# Patient Record
Sex: Female | Born: 1955 | Race: Black or African American | Hispanic: No | Marital: Single | Smoking: Current every day smoker
Health system: Southern US, Academic
[De-identification: ages and names within clinical notes are randomized; demographics above are authoritative.]

## PROBLEM LIST (undated history)

## (undated) DIAGNOSIS — M129 Arthropathy, unspecified: Secondary | ICD-10-CM

## (undated) DIAGNOSIS — M797 Fibromyalgia: Secondary | ICD-10-CM

## (undated) DIAGNOSIS — J449 Chronic obstructive pulmonary disease, unspecified: Secondary | ICD-10-CM

## (undated) DIAGNOSIS — R519 Headache, unspecified: Secondary | ICD-10-CM

## (undated) DIAGNOSIS — E079 Disorder of thyroid, unspecified: Secondary | ICD-10-CM

## (undated) DIAGNOSIS — F32A Depression, unspecified: Secondary | ICD-10-CM

## (undated) DIAGNOSIS — G4730 Sleep apnea, unspecified: Secondary | ICD-10-CM

## (undated) DIAGNOSIS — E66811 Obesity, class 1: Secondary | ICD-10-CM

## (undated) DIAGNOSIS — J45909 Unspecified asthma, uncomplicated: Secondary | ICD-10-CM

## (undated) DIAGNOSIS — R0602 Shortness of breath: Secondary | ICD-10-CM

## (undated) DIAGNOSIS — Z803 Family history of malignant neoplasm of breast: Secondary | ICD-10-CM

## (undated) DIAGNOSIS — M1990 Unspecified osteoarthritis, unspecified site: Secondary | ICD-10-CM

## (undated) DIAGNOSIS — E739 Lactose intolerance, unspecified: Principal | ICD-10-CM

## (undated) DIAGNOSIS — R3129 Other microscopic hematuria: Secondary | ICD-10-CM

## (undated) DIAGNOSIS — I10 Essential (primary) hypertension: Secondary | ICD-10-CM

## (undated) DIAGNOSIS — F329 Major depressive disorder, single episode, unspecified: Secondary | ICD-10-CM

## (undated) DIAGNOSIS — Z15060 Genetic susceptibility to colorectal cancer: Secondary | ICD-10-CM

## (undated) DIAGNOSIS — Z1509 Genetic susceptibility to other malignant neoplasm: Secondary | ICD-10-CM

## (undated) DIAGNOSIS — E669 Obesity, unspecified: Secondary | ICD-10-CM

## (undated) DIAGNOSIS — F419 Anxiety disorder, unspecified: Secondary | ICD-10-CM

## (undated) DIAGNOSIS — Z800 Family history of malignant neoplasm of digestive organs: Secondary | ICD-10-CM

## (undated) DIAGNOSIS — T7840XA Allergy, unspecified, initial encounter: Secondary | ICD-10-CM

## (undated) DIAGNOSIS — C801 Malignant (primary) neoplasm, unspecified: Secondary | ICD-10-CM

## (undated) DIAGNOSIS — K219 Gastro-esophageal reflux disease without esophagitis: Secondary | ICD-10-CM

## (undated) DIAGNOSIS — D045 Carcinoma in situ of skin of trunk: Secondary | ICD-10-CM

## (undated) DIAGNOSIS — E785 Hyperlipidemia, unspecified: Secondary | ICD-10-CM

## (undated) DIAGNOSIS — H269 Unspecified cataract: Secondary | ICD-10-CM

## (undated) DIAGNOSIS — Z1501 Genetic susceptibility to malignant neoplasm of breast: Secondary | ICD-10-CM

## (undated) DIAGNOSIS — Z8481 Family history of carrier of genetic disease: Secondary | ICD-10-CM

## (undated) HISTORY — DX: Family history of carrier of genetic disease: Z84.81

## (undated) HISTORY — DX: Hyperlipidemia, unspecified: E78.5

## (undated) HISTORY — DX: Carcinoma in situ of skin of trunk: D04.5

## (undated) HISTORY — DX: Depression, unspecified: F32.A

## (undated) HISTORY — PX: POLYPECTOMY: SHX149

## (undated) HISTORY — DX: Other microscopic hematuria: R31.29

## (undated) HISTORY — DX: Family history of malignant neoplasm of breast: Z80.3

## (undated) HISTORY — DX: Genetic susceptibility to colorectal cancer: Z15.060

## (undated) HISTORY — DX: Gastro-esophageal reflux disease without esophagitis: K21.9

## (undated) HISTORY — DX: Major depressive disorder, single episode, unspecified: F32.9

## (undated) HISTORY — DX: Unspecified cataract: H26.9

## (undated) HISTORY — DX: Allergy, unspecified, initial encounter: T78.40XA

## (undated) HISTORY — PX: OTHER SURGICAL HISTORY: SHX169

## (undated) HISTORY — PX: MASTECTOMY: SHX3

## (undated) HISTORY — PX: GANGLION CYST EXCISION: SHX1691

## (undated) HISTORY — DX: Genetic susceptibility to malignant neoplasm of breast: Z15.01

## (undated) HISTORY — DX: Genetic susceptibility to other malignant neoplasm: Z15.09

## (undated) HISTORY — DX: Family history of malignant neoplasm of digestive organs: Z80.0

## (undated) HISTORY — PX: NASAL SEPTUM SURGERY: SHX37

## (undated) HISTORY — DX: Lactose intolerance, unspecified: E73.9

## (undated) HISTORY — DX: Disorder of thyroid, unspecified: E07.9

---

## 2004-01-08 ENCOUNTER — Other Ambulatory Visit: Admission: RE | Admit: 2004-01-08 | Discharge: 2004-01-08 | Payer: Self-pay | Admitting: Internal Medicine

## 2004-01-30 ENCOUNTER — Encounter: Admission: RE | Admit: 2004-01-30 | Discharge: 2004-01-30 | Payer: Self-pay | Admitting: Internal Medicine

## 2004-08-23 ENCOUNTER — Ambulatory Visit: Payer: Self-pay | Admitting: Internal Medicine

## 2005-02-12 ENCOUNTER — Ambulatory Visit: Payer: Self-pay | Admitting: Internal Medicine

## 2005-02-12 ENCOUNTER — Other Ambulatory Visit: Admission: RE | Admit: 2005-02-12 | Discharge: 2005-02-12 | Payer: Self-pay | Admitting: Internal Medicine

## 2005-02-14 ENCOUNTER — Ambulatory Visit: Payer: Self-pay | Admitting: Internal Medicine

## 2005-03-12 ENCOUNTER — Encounter: Admission: RE | Admit: 2005-03-12 | Discharge: 2005-03-12 | Payer: Self-pay | Admitting: Internal Medicine

## 2005-03-21 ENCOUNTER — Ambulatory Visit: Payer: Self-pay | Admitting: Internal Medicine

## 2005-07-16 ENCOUNTER — Ambulatory Visit: Payer: Self-pay | Admitting: Internal Medicine

## 2005-08-08 ENCOUNTER — Ambulatory Visit: Payer: Self-pay | Admitting: Family Medicine

## 2005-08-25 ENCOUNTER — Ambulatory Visit: Payer: Self-pay | Admitting: Internal Medicine

## 2005-09-09 ENCOUNTER — Ambulatory Visit: Payer: Self-pay | Admitting: Internal Medicine

## 2006-01-25 DIAGNOSIS — R3129 Other microscopic hematuria: Secondary | ICD-10-CM

## 2006-01-25 HISTORY — DX: Other microscopic hematuria: R31.29

## 2006-04-08 ENCOUNTER — Other Ambulatory Visit: Admission: RE | Admit: 2006-04-08 | Discharge: 2006-04-08 | Payer: Self-pay | Admitting: Internal Medicine

## 2006-04-09 ENCOUNTER — Ambulatory Visit: Payer: Self-pay | Admitting: Internal Medicine

## 2006-05-13 ENCOUNTER — Ambulatory Visit: Payer: Self-pay | Admitting: Gastroenterology

## 2006-05-29 ENCOUNTER — Encounter (INDEPENDENT_AMBULATORY_CARE_PROVIDER_SITE_OTHER): Payer: Self-pay | Admitting: Specialist

## 2006-05-29 ENCOUNTER — Ambulatory Visit: Payer: Self-pay | Admitting: Gastroenterology

## 2006-05-29 LAB — HM COLONOSCOPY

## 2006-10-28 ENCOUNTER — Ambulatory Visit: Payer: Self-pay | Admitting: Internal Medicine

## 2006-12-18 ENCOUNTER — Ambulatory Visit: Payer: Self-pay | Admitting: Internal Medicine

## 2006-12-18 ENCOUNTER — Encounter: Payer: Self-pay | Admitting: Internal Medicine

## 2006-12-18 ENCOUNTER — Other Ambulatory Visit: Admission: RE | Admit: 2006-12-18 | Discharge: 2006-12-18 | Payer: Self-pay | Admitting: Internal Medicine

## 2006-12-18 LAB — CONVERTED CEMR LAB
ALT: 29 units/L (ref 0–40)
Basophils Relative: 0.6 % (ref 0.0–1.0)
CO2: 30 meq/L (ref 19–32)
Calcium: 9.3 mg/dL (ref 8.4–10.5)
Chloride: 106 meq/L (ref 96–112)
Cholesterol: 216 mg/dL (ref 0–200)
Creatinine, Ser: 0.8 mg/dL (ref 0.4–1.2)
Eosinophils Absolute: 0.1 10*3/uL (ref 0.0–0.6)
Eosinophils Relative: 1.6 % (ref 0.0–5.0)
Glucose, Bld: 106 mg/dL — ABNORMAL HIGH (ref 70–99)
HCT: 37.8 % (ref 36.0–46.0)
Platelets: 238 10*3/uL (ref 150–400)
RBC: 4.26 M/uL (ref 3.87–5.11)
RDW: 12.6 % (ref 11.5–14.6)
Sodium: 143 meq/L (ref 135–145)
TSH: 1.97 microintl units/mL (ref 0.35–5.50)
Total CHOL/HDL Ratio: 3.3
Triglycerides: 152 mg/dL — ABNORMAL HIGH (ref 0–149)
VLDL: 30 mg/dL (ref 0–40)

## 2007-01-06 ENCOUNTER — Encounter: Admission: RE | Admit: 2007-01-06 | Discharge: 2007-01-06 | Payer: Self-pay | Admitting: Internal Medicine

## 2007-01-27 ENCOUNTER — Encounter (INDEPENDENT_AMBULATORY_CARE_PROVIDER_SITE_OTHER): Payer: Self-pay | Admitting: Specialist

## 2007-01-27 ENCOUNTER — Ambulatory Visit (HOSPITAL_BASED_OUTPATIENT_CLINIC_OR_DEPARTMENT_OTHER): Admission: RE | Admit: 2007-01-27 | Discharge: 2007-01-27 | Payer: Self-pay | Admitting: Orthopedic Surgery

## 2007-03-29 ENCOUNTER — Encounter: Payer: Self-pay | Admitting: Internal Medicine

## 2007-05-03 ENCOUNTER — Telehealth: Payer: Self-pay | Admitting: Internal Medicine

## 2007-05-03 ENCOUNTER — Ambulatory Visit: Payer: Self-pay | Admitting: Internal Medicine

## 2007-05-03 DIAGNOSIS — F338 Other recurrent depressive disorders: Secondary | ICD-10-CM | POA: Insufficient documentation

## 2007-05-03 DIAGNOSIS — F32A Depression, unspecified: Secondary | ICD-10-CM | POA: Insufficient documentation

## 2007-05-03 DIAGNOSIS — K219 Gastro-esophageal reflux disease without esophagitis: Secondary | ICD-10-CM | POA: Insufficient documentation

## 2007-05-03 DIAGNOSIS — J309 Allergic rhinitis, unspecified: Secondary | ICD-10-CM | POA: Insufficient documentation

## 2007-05-03 DIAGNOSIS — E785 Hyperlipidemia, unspecified: Secondary | ICD-10-CM | POA: Insufficient documentation

## 2007-05-03 DIAGNOSIS — F419 Anxiety disorder, unspecified: Secondary | ICD-10-CM

## 2007-05-03 DIAGNOSIS — C4490 Unspecified malignant neoplasm of skin, unspecified: Secondary | ICD-10-CM | POA: Insufficient documentation

## 2007-05-03 DIAGNOSIS — D126 Benign neoplasm of colon, unspecified: Secondary | ICD-10-CM | POA: Insufficient documentation

## 2007-05-03 DIAGNOSIS — F329 Major depressive disorder, single episode, unspecified: Secondary | ICD-10-CM

## 2007-05-04 ENCOUNTER — Telehealth (INDEPENDENT_AMBULATORY_CARE_PROVIDER_SITE_OTHER): Payer: Self-pay | Admitting: *Deleted

## 2007-05-07 ENCOUNTER — Encounter: Payer: Self-pay | Admitting: Internal Medicine

## 2007-08-17 ENCOUNTER — Ambulatory Visit: Payer: Self-pay | Admitting: Internal Medicine

## 2007-08-20 ENCOUNTER — Telehealth (INDEPENDENT_AMBULATORY_CARE_PROVIDER_SITE_OTHER): Payer: Self-pay | Admitting: *Deleted

## 2007-08-24 ENCOUNTER — Telehealth: Payer: Self-pay | Admitting: Internal Medicine

## 2008-04-21 ENCOUNTER — Encounter: Payer: Self-pay | Admitting: Internal Medicine

## 2008-04-21 ENCOUNTER — Ambulatory Visit: Payer: Self-pay | Admitting: Internal Medicine

## 2008-04-21 ENCOUNTER — Other Ambulatory Visit: Admission: RE | Admit: 2008-04-21 | Discharge: 2008-04-21 | Payer: Self-pay | Admitting: Internal Medicine

## 2008-04-21 ENCOUNTER — Encounter (INDEPENDENT_AMBULATORY_CARE_PROVIDER_SITE_OTHER): Payer: Self-pay | Admitting: *Deleted

## 2008-04-21 LAB — CONVERTED CEMR LAB: Pap Smear: NORMAL

## 2008-04-24 ENCOUNTER — Encounter (INDEPENDENT_AMBULATORY_CARE_PROVIDER_SITE_OTHER): Payer: Self-pay | Admitting: *Deleted

## 2008-04-26 ENCOUNTER — Encounter (INDEPENDENT_AMBULATORY_CARE_PROVIDER_SITE_OTHER): Payer: Self-pay | Admitting: *Deleted

## 2008-05-01 ENCOUNTER — Encounter (INDEPENDENT_AMBULATORY_CARE_PROVIDER_SITE_OTHER): Payer: Self-pay | Admitting: *Deleted

## 2008-05-02 ENCOUNTER — Ambulatory Visit: Payer: Self-pay | Admitting: Internal Medicine

## 2008-05-05 LAB — CONVERTED CEMR LAB
Calcium: 8.7 mg/dL (ref 8.4–10.5)
Chloride: 104 meq/L (ref 96–112)
Cholesterol: 223 mg/dL (ref 0–200)
Direct LDL: 151.4 mg/dL
GFR calc non Af Amer: 80 mL/min
Glucose, Bld: 91 mg/dL (ref 70–99)
HCT: 39.6 % (ref 36.0–46.0)
HDL: 47 mg/dL (ref >39.0)
Lymphocytes Relative: 25.1 % (ref 12.0–46.0)
MCHC: 35.3 g/dL (ref 30.0–36.0)
Monocytes Absolute: 0.4 10*3/uL (ref 0.1–1.0)
Neutro Abs: 3.8 10*3/uL (ref 1.4–7.7)
RBC: 4.38 M/uL (ref 3.87–5.11)
RDW: 13 % (ref 11.5–14.6)

## 2008-05-22 ENCOUNTER — Ambulatory Visit: Payer: Self-pay | Admitting: Pulmonary Disease

## 2008-06-02 ENCOUNTER — Telehealth (INDEPENDENT_AMBULATORY_CARE_PROVIDER_SITE_OTHER): Payer: Self-pay | Admitting: *Deleted

## 2008-06-08 ENCOUNTER — Ambulatory Visit: Payer: Self-pay | Admitting: Internal Medicine

## 2008-06-12 LAB — CONVERTED CEMR LAB: Testosterone: 38.98 ng/dL (ref 10.00–70.0)

## 2008-06-13 LAB — CONVERTED CEMR LAB
DHEA-SO4: 170 ug/dL (ref 35–430)
Estradiol: 16.2 pg/mL

## 2008-06-19 ENCOUNTER — Ambulatory Visit (HOSPITAL_BASED_OUTPATIENT_CLINIC_OR_DEPARTMENT_OTHER): Admission: RE | Admit: 2008-06-19 | Discharge: 2008-06-19 | Payer: Self-pay | Admitting: Pulmonary Disease

## 2008-06-19 ENCOUNTER — Encounter: Payer: Self-pay | Admitting: Pulmonary Disease

## 2008-06-21 ENCOUNTER — Ambulatory Visit: Payer: Self-pay | Admitting: Internal Medicine

## 2008-06-28 ENCOUNTER — Ambulatory Visit: Payer: Self-pay | Admitting: Pulmonary Disease

## 2008-06-29 ENCOUNTER — Encounter: Payer: Self-pay | Admitting: Internal Medicine

## 2008-07-10 ENCOUNTER — Encounter: Payer: Self-pay | Admitting: Pulmonary Disease

## 2008-07-17 ENCOUNTER — Ambulatory Visit: Payer: Self-pay | Admitting: Pulmonary Disease

## 2008-07-30 DIAGNOSIS — G4733 Obstructive sleep apnea (adult) (pediatric): Secondary | ICD-10-CM | POA: Insufficient documentation

## 2008-08-03 ENCOUNTER — Encounter: Payer: Self-pay | Admitting: Pulmonary Disease

## 2008-08-08 ENCOUNTER — Ambulatory Visit: Payer: Self-pay | Admitting: Internal Medicine

## 2008-08-08 LAB — CONVERTED CEMR LAB
Glucose, Urine, Semiquant: NEGATIVE
Ketones, urine, test strip: NEGATIVE
Protein, U semiquant: NEGATIVE

## 2008-08-09 ENCOUNTER — Encounter: Payer: Self-pay | Admitting: Pulmonary Disease

## 2008-08-09 ENCOUNTER — Encounter: Payer: Self-pay | Admitting: Internal Medicine

## 2008-08-09 LAB — CONVERTED CEMR LAB

## 2008-08-10 ENCOUNTER — Encounter: Payer: Self-pay | Admitting: Internal Medicine

## 2008-08-14 ENCOUNTER — Telehealth (INDEPENDENT_AMBULATORY_CARE_PROVIDER_SITE_OTHER): Payer: Self-pay | Admitting: *Deleted

## 2008-08-16 ENCOUNTER — Encounter: Payer: Self-pay | Admitting: Internal Medicine

## 2008-09-13 ENCOUNTER — Encounter (INDEPENDENT_AMBULATORY_CARE_PROVIDER_SITE_OTHER): Payer: Self-pay | Admitting: *Deleted

## 2008-09-18 ENCOUNTER — Encounter: Payer: Self-pay | Admitting: Pulmonary Disease

## 2008-11-14 ENCOUNTER — Ambulatory Visit: Payer: Self-pay | Admitting: Pulmonary Disease

## 2008-12-22 ENCOUNTER — Ambulatory Visit: Payer: Self-pay | Admitting: Internal Medicine

## 2009-05-25 ENCOUNTER — Ambulatory Visit: Payer: Self-pay | Admitting: Pulmonary Disease

## 2009-06-18 ENCOUNTER — Ambulatory Visit: Payer: Self-pay | Admitting: Internal Medicine

## 2009-06-18 LAB — CONVERTED CEMR LAB: Ketones, urine, test strip: NEGATIVE

## 2009-06-19 ENCOUNTER — Encounter: Payer: Self-pay | Admitting: Internal Medicine

## 2009-07-04 ENCOUNTER — Telehealth (INDEPENDENT_AMBULATORY_CARE_PROVIDER_SITE_OTHER): Payer: Self-pay | Admitting: *Deleted

## 2009-08-20 ENCOUNTER — Ambulatory Visit: Payer: Self-pay | Admitting: Internal Medicine

## 2009-08-20 ENCOUNTER — Other Ambulatory Visit: Admission: RE | Admit: 2009-08-20 | Discharge: 2009-08-20 | Payer: Self-pay | Admitting: Internal Medicine

## 2009-08-20 ENCOUNTER — Encounter: Payer: Self-pay | Admitting: Internal Medicine

## 2009-08-20 DIAGNOSIS — G4700 Insomnia, unspecified: Secondary | ICD-10-CM | POA: Insufficient documentation

## 2009-08-24 ENCOUNTER — Ambulatory Visit: Payer: Self-pay | Admitting: Internal Medicine

## 2009-08-29 LAB — CONVERTED CEMR LAB
BUN: 14 mg/dL (ref 6–23)
Basophils Absolute: 0 10*3/uL (ref 0.0–0.1)
Calcium: 8.8 mg/dL (ref 8.4–10.5)
Cholesterol: 210 mg/dL — ABNORMAL HIGH (ref 0–200)
Direct LDL: 135 mg/dL
Eosinophils Relative: 3 % (ref 0.0–5.0)
GFR calc non Af Amer: 93.17 mL/min (ref >60)
Glucose, Bld: 103 mg/dL — ABNORMAL HIGH (ref 70–99)
HCT: 41.3 % (ref 36.0–46.0)
Monocytes Absolute: 0.3 10*3/uL (ref 0.1–1.0)
Platelets: 215 10*3/uL (ref 150.0–400.0)
Potassium: 4.3 meq/L (ref 3.5–5.1)
RDW: 12.1 % (ref 11.5–14.6)
Total CHOL/HDL Ratio: 4
Triglycerides: 171 mg/dL — ABNORMAL HIGH (ref 0.0–149.0)
VLDL: 34.2 mg/dL (ref 0.0–40.0)

## 2009-09-05 ENCOUNTER — Encounter: Payer: Self-pay | Admitting: Internal Medicine

## 2009-09-05 ENCOUNTER — Encounter: Admission: RE | Admit: 2009-09-05 | Discharge: 2009-09-05 | Payer: Self-pay | Admitting: Internal Medicine

## 2009-09-10 ENCOUNTER — Encounter (INDEPENDENT_AMBULATORY_CARE_PROVIDER_SITE_OTHER): Payer: Self-pay | Admitting: *Deleted

## 2009-10-15 ENCOUNTER — Encounter (INDEPENDENT_AMBULATORY_CARE_PROVIDER_SITE_OTHER): Payer: Self-pay | Admitting: *Deleted

## 2009-10-15 ENCOUNTER — Ambulatory Visit: Payer: Self-pay | Admitting: Internal Medicine

## 2009-10-15 LAB — CONVERTED CEMR LAB
Bilirubin Urine: NEGATIVE
Ketones, urine, test strip: NEGATIVE
Specific Gravity, Urine: 1.015
Urobilinogen, UA: 0.2
pH: 5.5

## 2009-10-16 ENCOUNTER — Encounter: Payer: Self-pay | Admitting: Internal Medicine

## 2009-10-17 ENCOUNTER — Telehealth: Payer: Self-pay | Admitting: Internal Medicine

## 2009-10-17 ENCOUNTER — Encounter: Payer: Self-pay | Admitting: Internal Medicine

## 2009-10-17 LAB — CONVERTED CEMR LAB
Casts: NONE SEEN /lpf
WBC, UA: NONE SEEN cells/hpf (ref <3)

## 2009-12-20 ENCOUNTER — Telehealth (INDEPENDENT_AMBULATORY_CARE_PROVIDER_SITE_OTHER): Payer: Self-pay | Admitting: *Deleted

## 2010-09-24 ENCOUNTER — Encounter: Admission: RE | Admit: 2010-09-24 | Discharge: 2010-09-24 | Payer: Self-pay | Admitting: Internal Medicine

## 2010-10-04 ENCOUNTER — Telehealth: Payer: Self-pay | Admitting: Internal Medicine

## 2010-10-14 ENCOUNTER — Telehealth: Payer: Self-pay | Admitting: Internal Medicine

## 2010-10-25 ENCOUNTER — Ambulatory Visit: Payer: Self-pay | Admitting: Internal Medicine

## 2010-10-25 ENCOUNTER — Other Ambulatory Visit
Admission: RE | Admit: 2010-10-25 | Discharge: 2010-10-25 | Payer: Self-pay | Source: Home / Self Care | Admitting: Internal Medicine

## 2010-10-25 LAB — HM PAP SMEAR

## 2010-11-15 ENCOUNTER — Encounter: Payer: Self-pay | Admitting: Internal Medicine

## 2010-11-15 ENCOUNTER — Other Ambulatory Visit: Payer: Self-pay | Admitting: Internal Medicine

## 2010-11-15 ENCOUNTER — Ambulatory Visit
Admission: RE | Admit: 2010-11-15 | Discharge: 2010-11-15 | Payer: Self-pay | Source: Home / Self Care | Attending: Internal Medicine | Admitting: Internal Medicine

## 2010-11-15 LAB — CBC WITH DIFFERENTIAL/PLATELET
Basophils Absolute: 0 10*3/uL (ref 0.0–0.1)
Basophils Relative: 0.6 % (ref 0.0–3.0)
Eosinophils Absolute: 0.1 10*3/uL (ref 0.0–0.7)
Eosinophils Relative: 1.6 % (ref 0.0–5.0)
HCT: 39.9 % (ref 36.0–46.0)
Hemoglobin: 13.8 g/dL (ref 12.0–15.0)
Lymphocytes Relative: 32.6 % (ref 12.0–46.0)
Lymphs Abs: 1.6 10*3/uL (ref 0.7–4.0)
MCHC: 34.5 g/dL (ref 30.0–36.0)
MCV: 90.3 fl (ref 78.0–100.0)
Monocytes Absolute: 0.3 10*3/uL (ref 0.1–1.0)
Monocytes Relative: 6.8 % (ref 3.0–12.0)
Neutro Abs: 2.9 10*3/uL (ref 1.4–7.7)
Neutrophils Relative %: 58.4 % (ref 43.0–77.0)
Platelets: 226 10*3/uL (ref 150.0–400.0)
RBC: 4.42 Mil/uL (ref 3.87–5.11)
RDW: 13.3 % (ref 11.5–14.6)
WBC: 5 10*3/uL (ref 4.5–10.5)

## 2010-11-15 LAB — LIPID PANEL
Cholesterol: 277 mg/dL — ABNORMAL HIGH (ref 0–200)
HDL: 66.1 mg/dL (ref >39.00)
Total CHOL/HDL Ratio: 4
Triglycerides: 104 mg/dL (ref 0.0–149.0)
VLDL: 20.8 mg/dL (ref 0.0–40.0)

## 2010-11-15 LAB — BASIC METABOLIC PANEL
BUN: 19 mg/dL (ref 6–23)
CO2: 29 mEq/L (ref 19–32)
Calcium: 8.9 mg/dL (ref 8.4–10.5)
Chloride: 105 mEq/L (ref 96–112)
Creatinine, Ser: 0.8 mg/dL (ref 0.4–1.2)
GFR: 84.34 mL/min (ref >60.00)
Glucose, Bld: 88 mg/dL (ref 70–99)
Potassium: 4.2 mEq/L (ref 3.5–5.1)
Sodium: 140 mEq/L (ref 135–145)

## 2010-11-15 LAB — LDL CHOLESTEROL, DIRECT: Direct LDL: 200.7 mg/dL

## 2010-11-15 LAB — TSH: TSH: 3.12 u[IU]/mL (ref 0.35–5.50)

## 2010-11-15 LAB — ALT: ALT: 17 U/L (ref 0–35)

## 2010-11-15 LAB — AST: AST: 16 U/L (ref 0–37)

## 2010-11-19 ENCOUNTER — Telehealth: Payer: Self-pay | Admitting: Internal Medicine

## 2010-11-20 ENCOUNTER — Telehealth (INDEPENDENT_AMBULATORY_CARE_PROVIDER_SITE_OTHER): Payer: Self-pay | Admitting: *Deleted

## 2010-11-26 NOTE — Progress Notes (Signed)
Summary: VAGINAL CREAM RX  FYI  Phone Note Refill Request Call back at Home Phone [phone] Message from:  Patient  Would like rx for Premarin Vaginal Cream,  was discussed at CPX in October, feel like I need rx now.  [facility] [address]  Initial call taken by: Kandice Hams,  December 20, 2009 11:05 AM  Follow-up for Phone Call        please ask about what sort of symptoms that she has Jose E. Paz MD  December 21, 2009 4:56 PM    C/o  Vaginal dryness with intercourse, patient says whatever you want to prescribe is fine .Kandice Hams  December 21, 2009 5:02 PM  please try over-the-counter lubricants first day and will see other  options if that doesn't help Jose E. Paz MD  December 21, 2009 5:15 PM   Additional Follow-up for Phone Call Additional follow up Details #1::        pt informed of Dr Drue Novel recommendations --has tried OTC lubricants and does not help still has the vaginal dryness with intercourse.Kandice Hams  December 24, 2009 10:35 AM trial with Premarin cream apply daily vaginally.  Call  enough for 3 months. will discuss further when she comes back to the office Jose E. Paz MD  December 24, 2009 2:43 PM     Additional Follow-up for Phone Call Additional follow up Details #2::    left msg for pt rx [facility]ed to [facility] .Kandice Hams  December 24, 2009 2:51 PM   New/Updated Medications: PREMARIN 0.625 MG/GM CREA (ESTROGENS, CONJUGATED) apply daily vaginally Prescriptions: PREMARIN 0.625 MG/GM CREA (ESTROGENS, CONJUGATED) apply daily vaginally  #1 x 2   Entered by:   Kandice Hams   Authorized by:   Nolon Rod. Paz MD   Signed by:   Kandice Hams on 12/24/2009   Method used:   Faxed to ...       [facility]  [address] [id number]* (retail)       [address]       [place], [place]  [address]       Ph: [phone] or [phone]       Fax: [phone]   RxID:   [id number]

## 2010-11-26 NOTE — Progress Notes (Signed)
Summary: Wellbutrin refill  Phone Note Refill Request Message from:  Fax from Pharmacy on October 04, 2010 1:45 PM  Refills Requested: Medication #1:  WELLBUTRIN XL 150 MG  TB24 1 by mouth once daily [BMN] [address], [place], [place]  [phone]   fax [phone]   qty = 90     Next Appointment Scheduled: none Initial call taken by: Jerolyn Shin,  October 04, 2010 1:55 PM  Follow-up for Phone Call        Pt has not been in a year. Army Fossa CMA  October 04, 2010 2:04 PM ok 30 tabs tell patient needs ov before she gets another [employer] E. Paz MD  October 04, 2010 5:11 PM   Additional Follow-up for Phone Call Additional follow up Details #1::        Pt is aware. Army Fossa CMA  October 04, 2010 5:12 PM     Prescriptions: WELLBUTRIN XL 150 MG  TB24 (BUPROPION HCL) 1 by mouth once daily Brand medically necessary #30 x 0   Entered by:   Army Fossa CMA   Authorized by:   Nolon Rod. Paz MD   Signed by:   Army Fossa CMA on 10/04/2010   Method used:   Electronically to        [facility] # [id number]* (retail)       [address]       [place], [place]  [address]       Ph: [phone] or [phone]       Fax: [phone]   RxID:   [id number]

## 2010-11-27 ENCOUNTER — Ambulatory Visit: Admit: 2010-11-27 | Payer: Self-pay | Admitting: Pulmonary Disease

## 2010-11-27 ENCOUNTER — Encounter: Payer: Self-pay | Admitting: Pulmonary Disease

## 2010-11-27 ENCOUNTER — Ambulatory Visit (INDEPENDENT_AMBULATORY_CARE_PROVIDER_SITE_OTHER): Payer: BC Managed Care – PPO | Admitting: Pulmonary Disease

## 2010-11-27 DIAGNOSIS — G4730 Sleep apnea, unspecified: Secondary | ICD-10-CM

## 2010-11-28 NOTE — Assessment & Plan Note (Signed)
Summary: cpx/kn   Vital Signs:  Patient profile:   55 year old female Height:      68.5 inches Weight:      173.25 pounds BMI:     26.05 Pulse rate:   101 / minute Pulse rhythm:   regular BP sitting:   132 / 86  (left arm) Cuff size:   regular  Vitals Entered By: Army Fossa CMA (October 25, 2010 2:34 PM) CC: CPX, not fasting. PAP Comments [facility] [address]   History of Present Illness: CPX   Current Medications (verified): 1)  Wellbutrin Xl 150 Mg  Tb24 (Bupropion Hcl) .Marland Kitchen.. 1 By Mouth Once Daily. No Further Refills Until Office Visit. 2)  Crestor 20 Mg Tabs (Rosuvastatin Calcium) .... As Direceted 3)  Cpap 7 Cm 4)  Vitamin D 1000 Unit Tabs (Cholecalciferol) .... Take 1 Tablet By Mouth Once A Day 5)  Flonase 50 Mcg/act  Susp (Fluticasone Propionate) .... Two Puffs Each Nostril Daily 6)  Benadryl Otc As Needed For Sleep 7)  Premarin 0.625 Mg/gm Crea (Estrogens, Conjugated) .... Apply Daily Vaginally 8)  Fish Oil 9)  Acid Control  Allergies (verified): 1)  ! Sulfa 2)  ! 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 Remote "cervical cauterization" Caesarean section deviated septum  Social History: remarried 10-11 1 child Current Smoker (up to 5 cigs daily) Alcohol use-yes  [employer]- vol[employer] caffeine - 3-4 cups daily diet-exercise---- improved, went to the bariatric clinic, was Rx HCG-appetite supressants from 9-11 to 12-11  Review of Systems General:  Denies fever. CV:  Denies chest pain or discomfort and swelling of feet. Resp:  Denies cough, shortness of breath, and wheezing. GI:   Denies bloody stools, nausea, and vomiting. GU:  Denies discharge and dysuria; used premarin cream , helped  SBE normal no vag d/c-bleed. Psych:  Denies anxiety and depression.  Physical Exam  General:  alert, well-developed, and well-nourished.   Neck:  no masses, no thyromegaly, and normal carotid upstroke.   Breasts:  No mass, nodules, thickening, tenderness, bulging, retraction, inflamation, nipple discharge or skin changes noted.  no LADs Lungs:  normal respiratory effort, no intercostal retractions, no accessory muscle use, and normal breath sounds.   Heart:  normal rate, regular rhythm, no murmur, and no gallop.   Abdomen:  soft, non-tender, normal bowel sounds, no distention, no masses, no guarding, and no rigidity.   Rectal:  external inspection of the anal area normal Genitalia:  normal introitus, no external lesions, no vaginal discharge, mucosa pink and moist, no vaginal or cervical lesions, no vaginal atrophy, no friaility or hemorrhage, normal uterus size and position, and no adnexal masses or tenderness.     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)-- next 2012   PAP today MMG  -08/2010 does SBE routinely  Dexa 12-2006  and 11-11  normal   continue with her healthy lifestyle She has some vaginal dryness, responding well to premarin  Problem # 2:  OBSTRUCTIVE SLEEP APNEA (ICD-780.57)  not using CPAP x 6 months, is not snoring  anymore and is not tired has lost wt (but in the past w/ same wt she did have OSA) wonders if needs to be retedt------ refer to pulmonary  Orders: Pulmonary Referral (Pulmonary)  Problem # 3:  HYPERLIPIDEMIA (ICD-272.4)  taking crestor "once or twice a week" even at the time of her last FLP  she was not taking it regularly because it caused aches and pains in the past lipitor caused pains as well  has been taking fish  oil would like FLP rechecked  plan: D/C Crestor, continue with fish oil,  labs consider pravachol The following medications were removed from the medication list:    Crestor 20 Mg Tabs (Rosuvastatin calcium) .Marland Kitchen... As direceted  Labs Reviewed: SGOT: 25 (08/24/2009)   SGPT: 32 (08/24/2009)   HDL:52.80 (08/24/2009), 47.0 (05/02/2008)  LDL:DEL (05/02/2008), DEL (12/18/2006)  Chol:210 (08/24/2009), 223 (05/02/2008)  Trig:171.0 (08/24/2009), 141 (05/02/2008)  Complete Medication List: 1)  Wellbutrin Xl 150 Mg Tb24 (Bupropion hcl) .Marland Kitchen.. 1 by mouth once daily. no further refills until office visit. 2)  Cpap 7 Cm  3)  Vitamin D 1000 Unit Tabs (Cholecalciferol) .... Take 1 tablet by mouth once a day 4)  Flonase 50 Mcg/act Susp (Fluticasone propionate) .... Two puffs each nostril daily 5)  Benadryl Otc As Needed For Sleep  6)  Premarin 0.625 Mg/gm Crea (Estrogens, conjugated) .... Apply daily vaginally 7)  Fish Oil  8)  Acid Control   Patient Instructions: 1)  please come back lasting 2)  FLP, BMP, CBC, TSH, AST, ALT, vitamin D---- dx v70 3)  Please schedule a follow-up appointment in 6 months .  Prescriptions: WELLBUTRIN XL 150 MG  TB24 (BUPROPION HCL) 1 by mouth once daily. NO FURTHER REFILLS UNTIL OFFICE VISIT.  #30 x 5   Entered by:   Army Fossa CMA   Authorized by:   Nolon Rod. Paz MD   Signed by:   Army Fossa CMA on 10/25/2010   Method used:   Electronically to        [facility] # [id number]* (retail)       [address]       [place], [place]  [address]       Ph: [phone] or [phone]       Fax: [phone]   RxID:   [id number] CRESTOR 20 MG TABS (ROSUVASTATIN CALCIUM) as direceted  #30 Tablet x 5   Entered by:   Army Fossa CMA   Authorized by:   Nolon Rod. Paz MD   Signed by:   Army Fossa CMA on 10/25/2010   Method used:   Electronically to        [facility] # [id number]* (retail)       [address]        [place], [place]  [address]       Ph: [phone] or [phone]       Fax: [phone]   RxID:   [id number]    Orders Added: 1)  Est. Patient age 59-64 [4] 2)  Pulmonary Referral [Pulmonary]     Preventive Care Screening  Mammogram:    Date:  08/27/2010    Results:  normal-per pt      Risk Factors:  Mammogram History:     Date of Last Mammogram:  08/27/2010    Results:  normal-per pt

## 2010-11-28 NOTE — Progress Notes (Signed)
Summary: Pravachol issue  Phone Note Call from Patient Call back at Home Phone [phone]   Summary of Call: Patient left message on triage that she is concerned about the high dosage of Pravachol. She wants to be sure that MD knows she was off Crestor several weeks prior to her labs. Please advise. Initial call taken by: Lucious Groves CMA,  November 19, 2010 8:36 AM  Follow-up for Phone Call        --I knew she was off pressor --Pravachol 40 mg is a regular dose. If she likes to start with 20 mg and see how she does is okay . Follow-up by: Nolon Rod. Cherysh Epperly MD,  November 19, 2010 1:56 PM  Additional Follow-up for Phone Call Additional follow up Details #1::        Left message on voicemail to call back to office. Lucious Groves CMA  November 19, 2010 3:31 PM   Patient notified and has already started the 40mg  and she will return as planned then look into starting a reduced dose. Lucious Groves CMA  November 20, 2010 12:50 PM

## 2010-11-28 NOTE — Progress Notes (Signed)
Summary: refill   Phone Note Call from Patient Call back at [facility] Phone [phone]   Summary of Call: Pt no longer was Brand name Wellbutrin-- she would like to go on generic? Okay to fill for pt?  Pt has pending appt 10/25/10. Army Fossa CMA  October 14, 2010 5:06 PM [facility] [address]   Follow-up for Phone Call        if she likes to go for generic is okay with me. She is due for office visit, call one-month supply and one refill. No further refills without office visit Follow-up by: Jose E. Paz MD,  October 14, 2010 5:47 PM    New/Updated Medications: WELLBUTRIN XL 150 MG  TB24 (BUPROPION HCL) 1 by mouth once daily. NO FURTHER REFILLS UNTIL OFFICE VISIT. OfPrescriptions: WELLBUTRIN XL 150 MG  TB24 (BUPROPION HCL) 1 by mouth once daily. NO FURTHER REFILLS UNTIL OFFICE VISIT.  #30 x 0   Entered by:   Army Fossa CMA   Authorized by:   Nolon Rod. Paz MD   Signed by:   Army Fossa CMA on 10/15/2010   Method used:   Electronically to        [facility] # [id number]* (retail)       [address]       [place], [place]  [address]       Ph: [phone] or [phone]       Fax: [phone]   RxID:   [id number]

## 2010-11-28 NOTE — Progress Notes (Signed)
Summary:  have orders, need diag codes for lab= 2/29--added  Phone Note Call from Patient   Caller: Patient Summary of Call: patient made appt for labs for 2/29 at 8:00----I have lab orders for FLP, AST, ALT---but I need diag codes please.    thanks Initial call taken by: Jerolyn Shin,  November 20, 2010 1:13 PM  Follow-up for Phone Call        272.4 Sheila Fuller [facility]  November 20, 2010 2:24 PM   added code to 2/29 lab.Jerolyn Shin  November 21, 2010 9:53 AM

## 2010-12-04 NOTE — Assessment & Plan Note (Signed)
Summary: pt due/mhh   Vital Signs:  Patient profile:   55 year old female Height:      68.5 inches Weight:      177 pounds BMI:     26.62 O2 Sat:      99 % on Room air Temp:     98.1 degrees F oral Pulse rate:   80 / minute BP sitting:   130 / 98  (left arm) Cuff size:   regular  Vitals Entered By: Zackery Barefoot CMA (November 27, 2010 10:37 AM)  O2 Flow:  Room air CC: Pt last seen 2010. States no longer is snoring and has stopped using CPAP x 8 months Comments Medications reviewed with patient Verified contact number and pharmacy with patient Zackery Barefoot CMA  November 27, 2010 10:37 AM    Copy to:  Dr. Drue Novel  CC:  Pt last seen 2010. States no longer is snoring and has stopped using CPAP x 8 months.  History of Present Illness: 54/F with  mod OSA. , AHI 16/h, RDI 35/h   1/10 reviewed download 10/14 - 11/15  & 11/23 -12/22>> need to improve compliance c/o hot flashes, nasal stuffiness , she uses nettipot  Due to hot flashes , she takes of the mask after a few hrs.   November 27, 2010 10:47 AM  - annual FU c/o menopausal symptoms, husband has not noted snoring - she has started exercise program, gained 10 lbs since study, but feels sleep apnea may have resolved & wants sleep study Denies chest pain, dyspnea, orthopnea, hemoptysis, fever, n/v/d, edema, headache.   Preventive Screening-Counseling & Management  Alcohol-Tobacco     Alcohol drinks/day: socially     Smoking Status: current     Packs/Day: 1/3      Year Started: 2009  Current Medications (verified): 1)  Wellbutrin Xl 150 Mg  Tb24 (Bupropion Hcl) .Marland Kitchen.. 1 By Mouth Once Daily. No Further Refills Until Office Visit. 2)  Cpap 7 Cm .... Not Using 3)  Vitamin D 1000 Unit Tabs (Cholecalciferol) .... Take 1 Tablet By Mouth Once A Day 4)  Flonase 50 Mcg/act  Susp (Fluticasone Propionate) .... Two Puffs Each Nostril Daily 5)  Benadryl 25 Mg Tabs (Diphenhydramine Hcl) .... As Needed For Sleep 6)  Premarin 0.625  Mg/gm Crea (Estrogens, Conjugated) .... Apply Daily Vaginally 7)  Fish Oil 1200 Mg Caps (Omega-3 Fatty Acids) .... Take 1 Capsule By Mouth Once A Day 8)  Ranitidine Hcl 150 Mg Tabs (Ranitidine Hcl) .... Take 1 Tablet By Mouth Every Morning 9)  Pravachol 40 Mg Tabs (Pravastatin Sodium) .Marland Kitchen.. 1 By Mouth At Bedtime.  Allergies (verified): 1)  ! Sulfa 2)  ! 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); no further f/u w/ specialists 01-2006---Microhematuria--saw Dr Vonita Moss, renal u/s : r pyelocaliectasis but CT was neg, cysto neg  Social History: Last updated: 10/25/2010 remarried 10-11 1 child Current Smoker (up to 5 cigs daily) Alcohol use-yes  [employer]- [employer] caffeine - 3-4 cups daily diet-exercise---- improved, went to the bariatric clinic, was Rx HCG-appetite supressants from 9-11 to 12-11  Social History: Alcohol drinks/day:  socially  Review of Systems  The patient denies anorexia, fever, weight loss, weight gain, vision loss, decreased hearing, hoarseness, chest pain, syncope, dyspnea on exertion, peripheral edema, prolonged cough, headaches,  hemoptysis, abdominal pain, melena, hematochezia, severe indigestion/heartburn, hematuria, muscle weakness, suspicious skin lesions, difficulty walking, depression, unusual weight change, abnormal bleeding, enlarged lymph nodes, and angioedema.    Physical Exam  Additional Exam:  GEN: A/Ox3; pleasant , NAD HEENT:  [place]/AT, , EACs-clear, TMs-wnl, NOSE-clear, THROAT-clear NECK:  Supple w/ fair ROM; no JVD; normal carotid impulses w/o bruits; no thyromegaly or nodules palpated; no lymphadenopathy. RESP  Clear to P & A; w/o, wheezes/ rales/ or rhonchi. CARD:  RRR, no m/r/g   [place]: Warm bil,  no calf tenderness edema, clubbing, pulses intact    Impression  & Recommendations:  Problem # 1:  OBSTRUCTIVE SLEEP APNEA (ICD-780.57) I doubt that her sleep apnea has improved - her wt  has increased. But her symptoms seem to have improved by report. Proceed with in lab PSG  - If AHI 15 range , will insist that she use her CPAP The pathophysiology of obstructive sleep apnea, it's cardiovascular consequences and modes of treatment including CPAP were discussed with the patient in great detail.  Orders: Est. Patient Level III (60[address]) Sleep Disorder Referral (Sleep Disorder)  Medications Added to Medication List This Visit: 1)  Cpap 7 Cm  .... Not using 2)  Benadryl 25 Mg Tabs (Diphenhydramine hcl) .... As needed for sleep 3)  Fish Oil 1200 Mg Caps (Omega-3 fatty acids) .... Take 1 capsule by mouth once a day 4)  Ranitidine Hcl 150 Mg Tabs (Ranitidine hcl) .... Take 1 tablet by mouth every morning  Patient Instructions: 1)  Copy sent to: dr Drue Novel 2)  Please schedule a follow-up appointment in 2 weeks after study   Orders Added: 1)  Est. Patient Level III [[address]] 2)  Sleep Disorder Referral [Sleep Disorder]

## 2010-12-24 ENCOUNTER — Ambulatory Visit (HOSPITAL_BASED_OUTPATIENT_CLINIC_OR_DEPARTMENT_OTHER): Payer: BC Managed Care – PPO | Attending: Pulmonary Disease

## 2010-12-24 DIAGNOSIS — R0989 Other specified symptoms and signs involving the circulatory and respiratory systems: Secondary | ICD-10-CM | POA: Insufficient documentation

## 2010-12-24 DIAGNOSIS — R0609 Other forms of dyspnea: Secondary | ICD-10-CM | POA: Insufficient documentation

## 2010-12-24 DIAGNOSIS — G478 Other sleep disorders: Secondary | ICD-10-CM | POA: Insufficient documentation

## 2010-12-25 ENCOUNTER — Other Ambulatory Visit: Payer: Self-pay

## 2010-12-28 DIAGNOSIS — R0609 Other forms of dyspnea: Secondary | ICD-10-CM

## 2010-12-28 DIAGNOSIS — G478 Other sleep disorders: Secondary | ICD-10-CM

## 2010-12-28 DIAGNOSIS — R0989 Other specified symptoms and signs involving the circulatory and respiratory systems: Secondary | ICD-10-CM

## 2010-12-31 ENCOUNTER — Ambulatory Visit: Payer: Self-pay | Admitting: Internal Medicine

## 2011-01-06 ENCOUNTER — Telehealth: Payer: Self-pay | Admitting: Pulmonary Disease

## 2011-01-23 NOTE — Progress Notes (Signed)
  Phone Note Outgoing Call   Reason for Call: Discuss lab or test results Summary of Call: pl let her know that sleep study showed that she stopped breathing significantly (26/h ) during sleep - so obstructive sleep apnea persists & she has to keep using her CPAP Initial call taken by: Comer Locket. Vassie Loll MD,  January 06, 2011 5:05 PM  Follow-up for Phone Call        lmomtcb x 1 Zackery Barefoot [facility]  January 10, 2011 5:14 PM   pt aware of above. Zackery Barefoot CMA  January 17, 2011 12:45 PM

## 2011-02-12 ENCOUNTER — Encounter: Payer: Self-pay | Admitting: Pulmonary Disease

## 2011-03-11 NOTE — Procedures (Signed)
Sheila Fuller, Sheila Fuller NO.:  [id number]   MEDICAL RECORD NO.:  [id number]          PATIENT TYPE:  OUT   LOCATION:  SLEEP CENTER                 FACILITY:  [facility]   PHYSICIAN:  Oretha Milch, MD      DATE OF BIRTH:  1956-02-23   DATE OF STUDY:  06/19/2008                            NOCTURNAL POLYSOMNOGRAM   REFERRING PHYSICIAN:   INDICATION FOR STUDY:  Excessive daytime somnolence and fatigue in this  55 year old woman with Epworth sleepiness score of 12/24.  Her weight  was 168 pounds and height 5 feet 9 inches with a BMI of 25, neck size of  14 inches.   This split polysomnogram was performed with the sleep technologist in  attendance.  EEG, EOG, EMG, EKG and respiratory parameters were  recorded.  Sleep stages, arousals and respiratory data and limb  movements as according to criteria laid out by the American Academy of  Sleep Medicine.   SLEEP ARCHITECTURE:  Sleep latency was 11 minutes.  Lights out was at  10:32 p.m. and lights on was at 4:49 a.m.  CPAP was initiated at 1:22  a.m.  During the diagnostic portion, total sleep time was 128 minutes  with a sleep period time of 158.5 minutes.  Awake after sleep onset was  31.5 minutes.  Sleep stages as a percentage of total sleep time was N1  16%, N2 63.3%, N3 0.4% and REM sleep 20.3% (26 minutes).  Supine sleep  was noted for 7.5 minutes.   During the CPAP titration portion, REM sleep was noted for 42 minutes  (32.8%), all of which was in the supine position.   RESPIRATORY DATA:  During the diagnostic portion, there were 18  hypopneas, 2 obstructive apnea, 2 central apneas, with an AHI of 16.4  events per hour, 41 RERAs were noted with an RDI of 35.6 events per  hour.  This was seemingly worse in the supine position.   Due to this degree of respiratory disturbance, CPAP was initiated at 4  cm and titrated to 7 cm.  At a level of 5 cm, for 61 minutes of sleep,  including 8.5 minutes of REM sleep, 1  obstructive apnea and 1 central  apnea and 2 hypopneas were noted with an AHI of 3.9 and a low saturation  of 92%.   At a level of 7 cm, for 91.5 minutes, 66.5 minutes of sleep was noted  with 33.5 minutes of REM sleep; 1 central apnea and 1 hypopnea were  noted with an AHI of 1.8, and 18 arousals were noted.  No desaturations  were noted at this level.   OXYGEN SATURATION DATA:  The lowest desaturation during the diagnostic  portion was 86% during non-REM sleep.  No desaturations were noted at  the final level of 7 cm.   AROUSAL DATA:  During the diagnostic portion, 97 arousals were noted for  an arousal index of 45.5 events per hour.  Of these, 33 were associated  with respiratory events.  Eighteen arousals were noted at the final  level of 7 cm, or 66.5 minutes.   LIMB MOVEMENT DATA:  No significant limb movements were  noted.   DISCUSSION:  Sleep disordered breathing seemed to be worse in the supine  position.  There seemed to be significant arousals on CPAP.  A ResMed  Mirage small-sized full face mask was used with heated humidifier.   IMPRESSIONS:  1. Moderate obstructive sleep apnea with hypopneas causing sleep      fragmentation and desaturations.  2. This was corrected by CPAP of 7 cm with a small full face mask.  3. No evidence of limb movements, cardiac arrhythmias or behavioral      disturbance during sleep.   RECOMMENDATIONS:  1. The treatment options for this degree of sleep apnea include CPAP      therapy and possibly oral appliances.  2. I would recommend CPAP therapy at less 7 cm with a small full face      mask.  3. She should be asked to avoid medications with sedative side effects      and to avoid driving when sleepy.  Compliance with CPAP should be      encouraged.      Oretha Milch, MD  Electronically Signed     RVA/MEDQ  D:  06/28/2008 15:33:34  T:  06/28/2008 17:00:40  Job:  [id number]

## 2011-03-14 NOTE — Op Note (Signed)
NAMEARAINA, BUTRICK              ACCOUNT NO.:  [id number]   MEDICAL RECORD NO.:  [id number]          PATIENT TYPE:  AMB   LOCATION:  DSC                          FACILITY:  [facility]   PHYSICIAN:  Cindee Salt, M.D.       DATE OF BIRTH:  03-Feb-1956   DATE OF PROCEDURE:  01/27/2007  DATE OF DISCHARGE:                               OPERATIVE REPORT   PREOPERATIVE DIAGNOSIS:  Mass, dorsal aspect left wrist.   POSTOPERATIVE DIAGNOSIS:  Mass, dorsal aspect left wrist.   OPERATION:  Excisional biopsy, mass dorsal aspect left wrist.   SURGEON:  Cindee Salt, M.D.   ASSISTANTCarolyne Fiscal R.N.   ANESTHESIA:  Forearm based IV regional.   HISTORY:  The patient is a 55 year old female with a history of a mass  on the dorsal aspect of left wrist.  This has not responded to  conservative treatment.  She is desirous having this removed.  She is  aware of risks and complications including the possibility of infection,  recurrence, injury to arteries, nerves, tendons, incomplete relief of  symptoms, dystrophy, loss of mobility.  She is desirous of proceeding.  In the preoperative area the mass is marked by both the patient and  surgeon, questions encouraged and answered.  Antibiotic given.   PROCEDURE:  The patient is brought to the operating room where a forearm  based IV regional anesthetic was carried out without difficulty.  She  was prepped using DuraPrep, supine position, left arm free.  A  transverse incision was made over the mass, carried down through  subcutaneous tissue.  Bleeders were electrocauterized.  Dorsal sensory  radial nerves were identified and protected.  The extensor retinaculum  was split.  The mass was __________  blunt and sharp dissection this was  dissected free, followed down to the wrist joint.  This was opened.  A  large sessile polypoid type cystic mass was encountered.  With blunt and  sharp dissection it was dissected free and sent to pathology.  The joint  was opened.   The area of the scapholunate ligament complex was noted  directly beneath the mass.  Several small areas of cystic change were  apparent.  These were removed with a small rongeur.  The wound was  irrigated.  Capsule was then closed with figure-of-eight 4-0 Vicryl  sutures.  The subcutaneous tissue was closed with interrupted 4-0 Vicryl  and skin with a subcuticular 4-0 Vicryl sutures.  Steri-Strips applied.  A sterile compressive dressing and splint to the wrist and hand applied.  The patient tolerated the procedure well was taken to the recovery room  for observation in satisfactory condition.  She is discharged home to  return to the [facility] in one week on Vicodin.          ______________________________  Cindee Salt, M.D.    GK/MEDQ  D:  01/27/2007  T:  01/27/2007  Job:  [id number]   cc:   Willow Ora, MD

## 2011-04-24 ENCOUNTER — Encounter: Payer: Self-pay | Admitting: Internal Medicine

## 2011-04-25 ENCOUNTER — Encounter: Payer: Self-pay | Admitting: Internal Medicine

## 2011-04-25 ENCOUNTER — Ambulatory Visit (INDEPENDENT_AMBULATORY_CARE_PROVIDER_SITE_OTHER): Payer: BC Managed Care – PPO | Admitting: Internal Medicine

## 2011-04-25 DIAGNOSIS — J309 Allergic rhinitis, unspecified: Secondary | ICD-10-CM

## 2011-04-25 DIAGNOSIS — F329 Major depressive disorder, single episode, unspecified: Secondary | ICD-10-CM

## 2011-04-25 DIAGNOSIS — E785 Hyperlipidemia, unspecified: Secondary | ICD-10-CM

## 2011-04-25 DIAGNOSIS — M549 Dorsalgia, unspecified: Secondary | ICD-10-CM

## 2011-04-25 DIAGNOSIS — M25539 Pain in unspecified wrist: Secondary | ICD-10-CM

## 2011-04-25 DIAGNOSIS — F3289 Other specified depressive episodes: Secondary | ICD-10-CM

## 2011-04-25 DIAGNOSIS — K219 Gastro-esophageal reflux disease without esophagitis: Secondary | ICD-10-CM

## 2011-04-25 DIAGNOSIS — M1990 Unspecified osteoarthritis, unspecified site: Secondary | ICD-10-CM | POA: Insufficient documentation

## 2011-04-25 DIAGNOSIS — D126 Benign neoplasm of colon, unspecified: Secondary | ICD-10-CM

## 2011-04-25 MED ORDER — CYCLOBENZAPRINE HCL 5 MG PO TABS: 10.0000 mg | ORAL_TABLET | Freq: Every day | ORAL | Status: DC

## 2011-04-25 MED ORDER — RABEPRAZOLE SODIUM 20 MG PO TBEC: 20.0000 mg | DELAYED_RELEASE_TABLET | Freq: Every day | ORAL | Status: DC

## 2011-04-25 MED ORDER — FLUTICASONE PROPIONATE 50 MCG/ACT NA SUSP: 2.0000 | Freq: Every day | NASAL | Status: DC

## 2011-04-25 MED ORDER — BUPROPION HCL ER (XL) 150 MG PO TB24: 300.0000 mg | ORAL_TABLET | Freq: Every day | ORAL | Status: DC

## 2011-04-25 NOTE — Assessment & Plan Note (Addendum)
Due for a colonoscopy,  refer to GI

## 2011-04-25 NOTE — Assessment & Plan Note (Signed)
RF , sx well controlled

## 2011-04-25 NOTE — Progress Notes (Signed)
  Subjective:    Patient ID: Sheila Fuller, female    DOB: 20-Apr-1956, 55 y.o.   MRN: [id number]  HPI Multiple issues: High cholesterol, tried  Pravachol for a short time, discontinued it due to to severe memory loss. No aches or pains however she believes that if she would continue with it she would have developed pain. Seasonal depression, currently worse because increasing stress at work, she experimented with taking 300 mg of Wellbutrin and it worked well for her. Would like to permanently increase the dose Sleep apnea, still an issue, followup by pulmonary GERD: long history of GERD symptoms, at some point took AcipHex and was doing great, now taking Zantac and she has on and off symptoms. Allergic rhinitis, needs a refill on Flonase Few weeks in history of right wrist pain, she suspects tendinitis. Occasional back pain after exercise, at some point she had Flexeril it helped. Would  like a refill  Past Medical History  Diagnosis Date  . Allergic rhinitis   . OSA on CPAP 2009  . Depression   . GERD (gastroesophageal reflux disease)     dx base on cough that decreased w/ PPIs  . Hyperlipidemia   . Carcinoma in situ of perianal skin     +XRT & chemo 2000; no further f/u w/ specialist.   . Microhematuria 01/2006    saw Dr.Peterson, renal u/s: r pyelocaliectasis but CT was neg, cysto neg    Past Surgical History  Procedure Date  . Cervical cauterization   . Cesarean section   . Nasal septum surgery      Review of Systems No dysphasia or odynophagia, no blood in the stools No unexplained weight loss or fever Denies problems with suicidal ideas     Objective:   Physical Exam  Constitutional: She is oriented to person, place, and time. She appears well-developed and well-nourished. No distress.  HENT:  Head: Normocephalic and atraumatic.  Cardiovascular: Normal rate, regular rhythm and normal heart sounds.   No murmur heard. Abdominal: Soft. She exhibits no distension.  There is no tenderness. There is no rebound and no guarding.  Neurological: She is alert and oriented to person, place, and time.  Skin: Skin is warm and dry. She is not diaphoretic.  Psychiatric: She has a normal mood and affect. Her behavior is normal. Judgment and thought content normal.        Assessment & Plan:  Today , I spent more than 30 min with the patient, >50% of the time counseling, and coordinating her care . Lengthy visit d/t # of issues

## 2011-04-25 NOTE — Assessment & Plan Note (Signed)
Intermittent back pain due to exercise, I agreed to prescribe Flexeril as needed.

## 2011-04-25 NOTE — Assessment & Plan Note (Signed)
Long history of GERD, AcipHex used to work well for her, currently having intermittent symptoms with H2 blockers. Plan: Restart AcipHex GI referral, given chronicity of symptoms, she may need a screening EGD.

## 2011-04-25 NOTE — Assessment & Plan Note (Signed)
Symptoms are usually seasonal but at this time she has a lot of stress at work, self trial with increased dose of Wellbutrin was successful. Increase dose to 300 mg for now.

## 2011-04-25 NOTE — Assessment & Plan Note (Signed)
History of intolerance to Lipitor due to pain  Intolerant to Crestor due to pain even if  taken once or twice a week failed trial with Pravachol due to memory loss Patient is not fasting today Plan: Recheck cholesterol when she comes back, zetia?

## 2011-04-25 NOTE — Assessment & Plan Note (Signed)
Refer to sports medicine.  

## 2011-05-01 ENCOUNTER — Encounter: Payer: Self-pay | Admitting: Family Medicine

## 2011-05-01 ENCOUNTER — Ambulatory Visit (INDEPENDENT_AMBULATORY_CARE_PROVIDER_SITE_OTHER): Payer: BC Managed Care – PPO | Admitting: Family Medicine

## 2011-05-01 VITALS — BP 123/83 | HR 97 | Temp 97.8°F | Ht 69.0 in | Wt 181.0 lb

## 2011-05-01 DIAGNOSIS — M654 Radial styloid tenosynovitis [de Quervain]: Secondary | ICD-10-CM | POA: Insufficient documentation

## 2011-05-01 NOTE — Progress Notes (Signed)
Subjective:    Patient ID: Sheila Fuller, female    DOB: 02-18-1956, 55 y.o.   MRN: [id number]  PCP: Willow Ora  HPI 55 yo F here for right wrist pain.  Patient denies known injury. She has been working at house (just bought a house to fix up) and also typing, using hands a lot at work. Pain started radial side of right wrist 6 weeks ago and has worsened. Has used a thumb spica brace past 4 weeks intermittently - feels like it hurts worse with this though. Ice hasn't worked as well as heat Ibuprofen helps but taking high doses of this. Pain radiates into thumb and also up forearm. Worse with twisting motions, opening doors, typing. No prior issues with this wrist. Is right handed.  Past Medical History  Diagnosis Date  . Allergic rhinitis   . OSA on CPAP 2009  . Depression   . GERD (gastroesophageal reflux disease)     dx base on cough that decreased w/ PPIs  . Hyperlipidemia   . Carcinoma in situ of perianal skin     +XRT & chemo 2000; no further f/u w/ specialist.   . Microhematuria 01/2006    saw Dr.Peterson, renal u/s: r pyelocaliectasis but CT was neg, cysto neg    Current Outpatient Prescriptions on File Prior to Visit  Medication Sig Dispense Refill  . buPROPion (WELLBUTRIN XL) 150 MG 24 hr tablet Take 2 tablets (300 mg total) by mouth daily.  180 tablet  1  . cholecalciferol (VITAMIN D) 1000 UNITS tablet Take 1,000 Units by mouth daily.        . cyclobenzaprine (FLEXERIL) 5 MG tablet Take 2 tablets (10 mg total) by mouth daily.  30 tablet  1  . diphenhydrAMINE (SOMINEX) 25 MG tablet Take 25 mg by mouth at bedtime as needed.        . fluticasone (FLONASE) 50 MCG/ACT nasal spray Place 2 sprays into the nose daily.  16 g  12  . GARLIC 1500 PO Take by mouth.        . Omega-3 Fatty Acids (FISH OIL) 1200 MG CAPS Take by mouth.        . RABEprazole (ACIPHEX) 20 MG tablet Take 1 tablet (20 mg total) by mouth daily.  90 tablet  1    Past Surgical History  Procedure Date    . Cervical cauterization   . Cesarean section   . Nasal septum surgery     Allergies  Allergen Reactions  . Statins     REACTION: myalgia  . Sulfonamide Derivatives     History   Social History  . Marital Status: Married    Spouse Name: N/A    Number of Children: N/A  . Years of Education: N/A   Occupational History  . Not on file.   Social History Main Topics  . Smoking status: Current Everyday Smoker  . Smokeless tobacco: Not on file   Comment: 5 cigarettes per day  . Alcohol Use: Yes  . Drug Use: Not on file  . Sexually Active: Not on file   Other Topics Concern  . Not on file   Social History Narrative   Caffeine- 3-4 cups dailyDiet- exercise- improved, went to the bariatric clinic, was Rx HCG-appetite suppressants 9/11/-12/11    Family History  Problem Relation Age of Onset  . Breast cancer Neg Hx   . Coronary artery disease Neg Hx   . Hypertension Neg Hx   . Stroke Neg Hx   .  Sudden death Neg Hx   . Heart attack Neg Hx   . Colon cancer      aunt  . Lung cancer Father     smoker  . Diabetes Mother     late onset  . Hyperlipidemia Mother   . Cervical cancer Sister     BP 123/83  Pulse 97  Temp(Src) 97.8 F (36.6 C) (Oral)  Ht 5\' 9"  (1.753 m)  Wt 181 lb (82.101 kg)  BMI 26.73 kg/m2  Review of Systems See HPI above.    Objective:   Physical Exam Gen: NAD R wrist: Mild swelling radial aspect of wrist compared to left.  No bruising, erythema. TTP 1st dorsal compartment.  No circumferential TTP 1st CMC joint.  No other ttp about hand, wrist, forearm. FROM thumb, fingers, wrist. Pain worst with thumb flexion and abduction.  Also hurts with wrist flexion and extension in same area noted above. NVI distally. + finkelsteins     MSK u/s:  Very large target sign 1st dorsal compartment right wrist with significant neovascularity deep in tendon sheath but not superficial.  No evidence of tendon tears.  No other abnormalities.  Assessment &  Plan:  1. Dequervains tenosynovitis right wrist - has tried thumb spica brace over past 4 weeks, ibuprofen, icing and heat.  Still pain is 6/10.  She would like to go ahead with tendon sheath injection which we did today.  Relative rest especially for next 1 week.  Continue thumb spica brace, ibuprofen as needed.  Can repeat injection in 4-6 weeks if she does not improve as expected.  Otherwise f/u prn.  After informed written consent (bleeding, infection, skin discoloration, proximity to radial artery), patient was seated on exam table.  Area overlying right 1st dorsal compartment was prepped with alcohol swab proximal to CMC thumb.  1st dorsal compartment then injected with 1:1 marcaine:depomedrol after withdrawal to ensure not placed into radial artery.  Medication felt dissecting through tendon sheath with small amount of leakage into soft tissues.  Patient tolerated the procedure well without any immediate complications.

## 2011-05-01 NOTE — Patient Instructions (Signed)
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 Avoid painful activities as much as possible. Wear the thumb spica brace as often as possible to rest this typically for 4-6 weeks. Ice 15 minutes at a time 3-4 times a day - if heat feels better, ok to do this instead. A cortisone injection typically helps a great deal with this. Physical therapy is a consideration if you are not improving. A repeat cortisone injection is also an option if not improving as expect over next 3-4 weeks. You can repeat up to 2 injections but if these still do not help, surgical referral is indicated. Follow up with me as needed.

## 2011-05-01 NOTE — Assessment & Plan Note (Signed)
has tried thumb spica brace over past 4 weeks, ibuprofen, icing and heat.  Still pain is 6/10.  She would like to go ahead with tendon sheath injection which we did today.  Relative rest especially for next 1 week.  Continue thumb spica brace, ibuprofen as needed.  Can repeat injection in 4-6 weeks if she does not improve as expected.  Otherwise f/u prn.  After informed written consent (bleeding, infection, skin discoloration, proximity to radial artery), patient was seated on exam table.  Area overlying right 1st dorsal compartment was prepped with alcohol swab proximal to CMC thumb.  1st dorsal compartment then injected with 1:1 marcaine:depomedrol after withdrawal to ensure not placed into radial artery.  Medication felt dissecting through tendon sheath with small amount of leakage into soft tissues.  Patient tolerated the procedure well without any immediate complications.

## 2011-06-06 ENCOUNTER — Ambulatory Visit: Payer: BC Managed Care – PPO | Admitting: Gastroenterology

## 2011-06-17 ENCOUNTER — Encounter: Payer: Self-pay | Admitting: Gastroenterology

## 2011-06-17 ENCOUNTER — Ambulatory Visit (INDEPENDENT_AMBULATORY_CARE_PROVIDER_SITE_OTHER): Payer: BC Managed Care – PPO | Admitting: Gastroenterology

## 2011-06-17 DIAGNOSIS — Z720 Tobacco use: Secondary | ICD-10-CM | POA: Insufficient documentation

## 2011-06-17 DIAGNOSIS — K219 Gastro-esophageal reflux disease without esophagitis: Secondary | ICD-10-CM

## 2011-06-17 DIAGNOSIS — F17200 Nicotine dependence, unspecified, uncomplicated: Secondary | ICD-10-CM

## 2011-06-17 DIAGNOSIS — Z86010 Personal history of colonic polyps: Secondary | ICD-10-CM

## 2011-06-17 MED ORDER — PEG-KCL-NACL-NASULF-NA ASC-C 100 G PO SOLR: 1.0000 | ORAL | Status: DC

## 2011-06-17 NOTE — Patient Instructions (Addendum)
One of your biggest health concerns is your smoking.  This increases your risk for most cancers and serious cardiovascular diseases such as strokes, heart attacks.  You should try your best to stop.  If you need assistence, please contact your PCP or Smoking Cessation Class at [facility] [phone]) or [facility]uit[facility] (1-800-QUIT-NOW). Please start taking citrucel (orange flavored) powder fiber supplement.  This may cause some bloating at first but that usually goes away. Begin with a small spoonful and work your way up to a large, heaping spoonful daily over a week. GERD handout given. You will be set up for a colonoscopy for h/o polyps. You will be set up for an upper endoscopy for chronic GERD symptoms. A copy of this information will be made available to Dr. Drue Novel.

## 2011-06-17 NOTE — Progress Notes (Signed)
Review of pertinent gastrointestinal problems: 1. Adenoma removed from colon 2007, also diverticulosis.  Recall at 5 year interval 2. Anal carcinoma (in situ) treated 2000   HPI: This is a  very pleasant 55 year old woman whom I last saw at the time, colonoscopy about 5 years ago.  She is due for recall colonoscopy.  Has "bouts of diveticulitis" constipation followed by diarrhea (no pains, no fevers).  Sound more like alternating bowel.  She has intermittent GERD sypmtoms (pyrosis). This has been going on for many years, lately a bit worse than usual. Has a lot of stress at work. Was taking pepcid/zantac, daily.  Was helping but not completely. No dysphagia.  Restarted aciphex, noticed a huge improvement in symptoms.  No nausea, vomiting.  No overt gi bleeding.  Overall stable weight.      Review of systems: Pertinent positive and negative review of systems were noted in the above HPI section.  All other review of systems was otherwise negative.   Past Medical History  Diagnosis Date  . Allergic rhinitis   . OSA on CPAP 2009  . Depression   . GERD (gastroesophageal reflux disease)     dx base on cough that decreased w/ PPIs  . Hyperlipidemia   . Carcinoma in situ of perianal skin     +XRT & chemo 2000; no further f/u w/ specialist.   . Microhematuria 01/2006    saw Dr.Peterson, renal u/s: r pyelocaliectasis but CT was neg, cysto neg    Past Surgical History  Procedure Date  . Cervical cauterization   . Cesarean section   . Nasal septum surgery      reports that she has been smoking Cigarettes.  She has never used smokeless tobacco. She reports that she drinks alcohol. She reports that she does not use illicit drugs.  family history includes Cervical cancer in her sister; Colon cancer in an unspecified family member; Diabetes in her mother; Hyperlipidemia in her mother; and Lung cancer in her father.  There is no history of Breast cancer, and Coronary artery disease, and  Hypertension, and Stroke, and Sudden death, and Heart attack, .    Current Medications, Allergies were all reviewed with the patient via [facility] electronic medical record system.    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 She will continue this. I have given her a GERD handout to go over some of the other lifestyle modifications that may help including quitting smoking. We will proceed with colonoscopy for polyp surveillance and EGD for chronic GERD symptoms at her soonest convenience.

## 2011-07-21 DIAGNOSIS — D126 Benign neoplasm of colon, unspecified: Secondary | ICD-10-CM

## 2011-07-23 ENCOUNTER — Ambulatory Visit (AMBULATORY_SURGERY_CENTER): Payer: BC Managed Care – PPO | Admitting: Gastroenterology

## 2011-07-23 ENCOUNTER — Encounter: Payer: Self-pay | Admitting: Gastroenterology

## 2011-07-23 DIAGNOSIS — D126 Benign neoplasm of colon, unspecified: Secondary | ICD-10-CM

## 2011-07-23 DIAGNOSIS — Z860100 Personal history of colon polyps, unspecified: Secondary | ICD-10-CM

## 2011-07-23 DIAGNOSIS — K219 Gastro-esophageal reflux disease without esophagitis: Secondary | ICD-10-CM

## 2011-07-23 DIAGNOSIS — Z1211 Encounter for screening for malignant neoplasm of colon: Secondary | ICD-10-CM

## 2011-07-23 MED ORDER — SODIUM CHLORIDE 0.9 % IV SOLN: 500.0000 mL | INTRAVENOUS | Status: DC

## 2011-07-23 NOTE — Progress Notes (Signed)
Passed large amt of air during the endoscopy

## 2011-07-24 ENCOUNTER — Telehealth: Payer: Self-pay

## 2011-07-24 NOTE — Telephone Encounter (Signed)
Left message on answering machine. 

## 2011-10-08 ENCOUNTER — Other Ambulatory Visit: Payer: Self-pay | Admitting: Internal Medicine

## 2011-10-08 DIAGNOSIS — Z1231 Encounter for screening mammogram for malignant neoplasm of breast: Secondary | ICD-10-CM

## 2011-10-29 ENCOUNTER — Ambulatory Visit
Admission: RE | Admit: 2011-10-29 | Discharge: 2011-10-29 | Disposition: A | Discharge status: Home / Self Care | Payer: BC Managed Care – PPO | Source: Ambulatory Visit | Attending: Internal Medicine | Admitting: Internal Medicine

## 2011-10-29 DIAGNOSIS — Z1231 Encounter for screening mammogram for malignant neoplasm of breast: Secondary | ICD-10-CM

## 2011-11-28 ENCOUNTER — Ambulatory Visit (INDEPENDENT_AMBULATORY_CARE_PROVIDER_SITE_OTHER): Payer: BC Managed Care – PPO | Admitting: Internal Medicine

## 2011-11-28 ENCOUNTER — Other Ambulatory Visit (HOSPITAL_COMMUNITY)
Admission: RE | Admit: 2011-11-28 | Discharge: 2011-11-28 | Disposition: A | Discharge status: Home / Self Care | Payer: BC Managed Care – PPO | Source: Ambulatory Visit | Attending: Internal Medicine | Admitting: Internal Medicine

## 2011-11-28 DIAGNOSIS — J449 Chronic obstructive pulmonary disease, unspecified: Secondary | ICD-10-CM | POA: Insufficient documentation

## 2011-11-28 DIAGNOSIS — Z0000 Encounter for general adult medical examination without abnormal findings: Secondary | ICD-10-CM | POA: Insufficient documentation

## 2011-11-28 DIAGNOSIS — Z01419 Encounter for gynecological examination (general) (routine) without abnormal findings: Secondary | ICD-10-CM | POA: Insufficient documentation

## 2011-11-28 DIAGNOSIS — Z124 Encounter for screening for malignant neoplasm of cervix: Secondary | ICD-10-CM

## 2011-11-28 DIAGNOSIS — R0609 Other forms of dyspnea: Secondary | ICD-10-CM

## 2011-11-28 DIAGNOSIS — R0600 Dyspnea, unspecified: Secondary | ICD-10-CM

## 2011-11-28 DIAGNOSIS — M25539 Pain in unspecified wrist: Secondary | ICD-10-CM

## 2011-11-28 DIAGNOSIS — R0989 Other specified symptoms and signs involving the circulatory and respiratory systems: Secondary | ICD-10-CM

## 2011-11-28 NOTE — Progress Notes (Signed)
  Subjective:    Patient ID: Sheila Fuller, female    DOB: 01-30-1956, 56 y.o.   MRN: [id number]  HPI CPX Complains of dyspnea on exertion for the last few months. She  walks 2 miles some days, after 1-1/2 mile has noted that she gets short of breath, denies any cough or wheezing. In previous years, she was able to do the same thing without problems. Denies chest pain, no lower extremity edema or palpitations. She does feel fatigued with difficulty breathing. On further questioning, she has noted occ nocturnal cough and occasional wheezing. She is concerned because her family history of heart disease. Also, continue with wrist pain. See assessment and plan 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); no further f/u w/ specialists 01-2006---Microhematuria--saw Dr Vonita Moss, renal u/s : r pyelocaliectasis but CT was neg, cysto neg  Past Surgical History: Remote "cervical cauterization" Caesarean section deviated septum  Social History: remarried 10-11, 1 child Current Smoker (up to 5 cigs daily) Alcohol use-- yes  [employer]- [employer] caffeine - 3-4 cups daily diet- needs improvement  exercise-- some   Family History CAD-- brother stent at age 101 Stroke--no DM-- M Colon ca--distant aunt, age ~32 Breast ca--no   Review of Systems  Gastrointestinal: Negative for nausea, abdominal pain, diarrhea and blood in stool.  Genitourinary: Negative for hematuria, vaginal bleeding, vaginal discharge and difficulty urinating.  Psychiatric/Behavioral:       Depression well controlled w/o welbutrin       Objective:   Physical Exam  Constitutional: She is oriented to person, place, and time. She appears well-developed and well-nourished. No distress.  HENT:  Head: Normocephalic and atraumatic.  Neck: No thyromegaly present.  Cardiovascular: Normal rate,  regular rhythm and normal heart sounds.   No murmur heard. Pulmonary/Chest: Effort normal and breath sounds normal. No respiratory distress. She has no wheezes. She has no rales.  Abdominal: Soft. Bowel sounds are normal. She exhibits no distension. There is no tenderness. There is no rebound and no guarding.  Genitourinary: Vagina normal and uterus normal.       esternal exam normal, cervix w/o lesion or d/c, bimanual exam normal. Breast exam normal  Musculoskeletal: She exhibits no edema.  Neurological: She is alert and oriented to person, place, and time.  Skin: Skin is warm and dry. She is not diaphoretic.  Psychiatric: She has a normal mood and affect. Her behavior is normal. Judgment and thought content normal.      Assessment & Plan:

## 2011-11-28 NOTE — Patient Instructions (Signed)
Come back fasting: FLP CMP TSH CBC--- dx v70

## 2011-11-28 NOTE — Assessment & Plan Note (Addendum)
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

## 2011-11-28 NOTE — Assessment & Plan Note (Addendum)
56 year old lady smoker with a family history of heart disease who presents with dyspnea on exertion of new onset. She really is able to  walk 1.5 mile  before she gets SOB but that represents a change from previous baseline. This could represent atypical angina in a female. Mild wheezing, COPD? Plan: EKG, chest x-ray, PFTs, stress test. RTC 3 months

## 2011-11-28 NOTE — Assessment & Plan Note (Signed)
Increased pain, she had a local injection 3 months ago with temporary relief. We agreed to treat conservatively, avoid playing bowling, if not better will call for ortho referral

## 2011-11-30 ENCOUNTER — Encounter: Payer: Self-pay | Admitting: Internal Medicine

## 2011-12-01 ENCOUNTER — Other Ambulatory Visit: Payer: Self-pay | Admitting: Internal Medicine

## 2011-12-01 DIAGNOSIS — Z0000 Encounter for general adult medical examination without abnormal findings: Secondary | ICD-10-CM

## 2011-12-02 ENCOUNTER — Other Ambulatory Visit: Payer: BC Managed Care – PPO

## 2011-12-03 ENCOUNTER — Other Ambulatory Visit: Payer: Self-pay | Admitting: Internal Medicine

## 2011-12-03 DIAGNOSIS — Z0000 Encounter for general adult medical examination without abnormal findings: Secondary | ICD-10-CM

## 2011-12-04 ENCOUNTER — Other Ambulatory Visit (INDEPENDENT_AMBULATORY_CARE_PROVIDER_SITE_OTHER): Payer: BC Managed Care – PPO

## 2011-12-04 DIAGNOSIS — Z0000 Encounter for general adult medical examination without abnormal findings: Secondary | ICD-10-CM

## 2011-12-04 LAB — CBC WITH DIFFERENTIAL/PLATELET
Basophils Absolute: 0 10*3/uL (ref 0.0–0.1)
Eosinophils Relative: 3.3 % (ref 0.0–5.0)
HCT: 39.9 % (ref 36.0–46.0)
Lymphs Abs: 1.5 10*3/uL (ref 0.7–4.0)
MCV: 89.5 fl (ref 78.0–100.0)
Monocytes Absolute: 0.4 10*3/uL (ref 0.1–1.0)
Monocytes Relative: 8.1 % (ref 3.0–12.0)
Neutrophils Relative %: 58.1 % (ref 43.0–77.0)
Platelets: 213 10*3/uL (ref 150.0–400.0)
RDW: 13.8 % (ref 11.5–14.6)
WBC: 5 10*3/uL (ref 4.5–10.5)

## 2011-12-04 LAB — COMPREHENSIVE METABOLIC PANEL
AST: 19 U/L (ref 0–37)
Albumin: 4 g/dL (ref 3.5–5.2)
Alkaline Phosphatase: 60 U/L (ref 39–117)
Calcium: 9 mg/dL (ref 8.4–10.5)
Chloride: 105 mEq/L (ref 96–112)
Glucose, Bld: 89 mg/dL (ref 70–99)
Potassium: 4.3 mEq/L (ref 3.5–5.1)
Sodium: 141 mEq/L (ref 135–145)
Total Protein: 6.9 g/dL (ref 6.0–8.3)

## 2011-12-04 LAB — LIPID PANEL
HDL: 55.9 mg/dL (ref >39.00)
VLDL: 18.8 mg/dL (ref 0.0–40.0)

## 2011-12-04 LAB — LDL CHOLESTEROL, DIRECT: Direct LDL: 185.4 mg/dL

## 2011-12-09 ENCOUNTER — Encounter: Payer: Self-pay | Admitting: Family Medicine

## 2011-12-09 ENCOUNTER — Ambulatory Visit (INDEPENDENT_AMBULATORY_CARE_PROVIDER_SITE_OTHER): Payer: BC Managed Care – PPO | Admitting: Family Medicine

## 2011-12-09 VITALS — BP 145/105 | HR 88 | Temp 97.8°F | Ht 68.0 in | Wt 180.0 lb

## 2011-12-09 DIAGNOSIS — M654 Radial styloid tenosynovitis [de Quervain]: Secondary | ICD-10-CM

## 2011-12-09 NOTE — Progress Notes (Signed)
Subjective:    Patient ID: Sheila Fuller, female    DOB: [birth year], 56 y.o.   MRN: [id number]  PCP: Willow Ora  HPI  56 yo F here for for f/u right wrist pain.  05/01/11: Patient denies known injury. She has been working at house (just bought a house to fix up) and also typing, using hands a lot at work. Pain started radial side of right wrist 6 weeks ago and has worsened. Has used a thumb spica brace past 4 weeks intermittently - feels like it hurts worse with this though. Ice hasn't worked as well as heat Ibuprofen helps but taking high doses of this. Pain radiates into thumb and also up forearm. Worse with twisting motions, opening doors, typing. No prior issues with this wrist. Is right handed.  12/09/11: Patient reports injection for dequervains helped cure wrist pain for about 6 months - started to have pain in same area radial aspect of wrist at end of December. Believes this worsened with bowling - has stopped playing and plans to rest for 2-3 months. No numbness/tingling. NSAIDs help with pain. + Swelling in this area.  Past Medical History  Diagnosis Date  . Allergic rhinitis   . OSA on CPAP 2009  . Depression   . GERD (gastroesophageal reflux disease)     dx base on cough that decreased w/ PPIs  . Hyperlipidemia   . Carcinoma in situ of perianal skin     +XRT & chemo 2000; no further f/u w/ specialist.   . Microhematuria 01/2006    saw Dr.Peterson, renal u/s: r pyelocaliectasis but CT was neg, cysto neg    Current Outpatient Prescriptions on File Prior to Visit  Medication Sig Dispense Refill  . cholecalciferol (VITAMIN D) 1000 UNITS tablet Take 1,000 Units by mouth daily.        . diphenhydrAMINE (SOMINEX) 25 MG tablet Take 25 mg by mouth at bedtime as needed.        . fluticasone (FLONASE) 50 MCG/ACT nasal spray Place 2 sprays into the nose daily.  16 g  12  . GARLIC 1500 PO Take by mouth.        . Omega-3 Fatty Acids (FISH OIL) 1200 MG CAPS Take by mouth.         . RABEprazole (ACIPHEX) 20 MG tablet Take 1 tablet (20 mg total) by mouth daily.  90 tablet  1   Current Facility-Administered Medications on File Prior to Visit  Medication Dose Route Frequency Provider Last Rate Last Dose  . 0.9 %  sodium chloride infusion  500 mL Intravenous Continuous Rob Bunting, MD        Past Surgical History  Procedure Date  . Cervical cauterization   . Cesarean section   . Nasal septum surgery     Allergies  Allergen Reactions  . Statins     REACTION: myalgia  . Sulfonamide Derivatives     History   Social History  . Marital Status: Married    Spouse Name: N/A    Number of Children: 1  . 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 Smoking status: Current Everyday Smoker -- 0.3 packs/day for 3 years    Types: Cigarettes  . Smokeless tobacco: Never Used   Comment: 5 cigarettes per day  . Alcohol Use: Yes  . Drug Use: No  . Sexually Active: Not on file   Other  Topics Concern  . Not on file   Social History Narrative   Caffeine- 3-4 cups dailyDiet- exercise- improved, went to the bariatric clinic, was Rx HCG-appetite suppressants 9/11/-12/11    Family History  Problem Relation Age of Onset  . Breast cancer Neg Hx   . Coronary artery disease Neg Hx   . Hypertension Neg Hx   . Stroke Neg Hx   . Sudden death Neg Hx   . Heart attack Neg Hx   . Colon cancer      aunt  . Lung cancer Father     smoker  . Diabetes Mother     late onset  . Hyperlipidemia Mother   . Cervical cancer Sister     BP 145/105  Pulse 88  Temp(Src) 97.8 F (36.6 C) (Oral)  Ht 5\' 8"  (1.727 m)  Wt 180 lb (81.647 kg)  BMI 27.37 kg/m2  Review of Systems  See HPI above.    Objective:   Physical Exam  Gen: NAD R wrist: Mild swelling radial aspect of wrist compared to left.  No bruising, erythema. TTP 1st dorsal compartment.  No circumferential TTP 1st CMC joint.  No other  ttp about hand, wrist, forearm. FROM thumb, fingers, wrist. Pain worst with thumb flexion and abduction.  Also hurts with wrist flexion and extension in same area noted above. NVI distally. + finkelsteins     MSK u/s:  Very large target sign 1st dorsal compartment right wrist with mild neovascularity deep in tendon sheath.  No evidence of tendon tears.  No other abnormalities.  Assessment & Plan:  1. Dequervains tenosynovitis right wrist - repeat injection done today - to restart using thumb spica brace, nsaids prn.  Relative rest especially for next 1 week.  To consider working with bowling pro if available to assess her bowling motion.  Discussed some basic strengthening exercises as well.  F/u prn.  After informed written consent (bleeding, infection, skin discoloration, proximity to radial artery), patient was seated on exam table.  Area overlying right 1st dorsal compartment was prepped with alcohol swab.  1st dorsal compartment then injected with 1:1 marcaine:depomedrol after withdrawal to ensure not placed into radial artery.  Medication felt dissecting through tendon.  Patient tolerated the procedure well without any immediate complications.

## 2011-12-09 NOTE — Assessment & Plan Note (Signed)
Dequervains tenosynovitis right wrist - repeat injection done today - to restart using thumb spica brace, nsaids prn.  Relative rest especially for next 1 week.  To consider working with bowling pro if available to assess her bowling motion.  Discussed some basic strengthening exercises as well.  F/u prn.  After informed written consent (bleeding, infection, skin discoloration, proximity to radial artery), patient was seated on exam table.  Area overlying right 1st dorsal compartment was prepped with alcohol swab.  1st dorsal compartment then injected with 1:1 marcaine:depomedrol after withdrawal to ensure not placed into radial artery.  Medication felt dissecting through tendon.  Patient tolerated the procedure well without any immediate complications.

## 2011-12-09 NOTE — Patient Instructions (Signed)
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 Avoid painful activities as much as possible. Speak with the bowling alley and see if there is a pro or someone who can evaluate your bowling motion to see if there is something you're doing to cause excessive strain to these tendons and tendon sheath into your thumb. Wear the thumb spica brace as often as possible to rest this typically for 4-6 weeks. Ice 15 minutes at a time 3-4 times a day - if heat feels better, ok to do this instead. A cortisone injection typically helps a great deal with this - you were given this today. Follow up with me as needed.

## 2011-12-11 MED ORDER — EZETIMIBE 10 MG PO TABS: 10.0000 mg | ORAL_TABLET | Freq: Every day | ORAL | Status: DC

## 2011-12-15 ENCOUNTER — Ambulatory Visit (HOSPITAL_COMMUNITY): Payer: BC Managed Care – PPO | Attending: Cardiology | Admitting: Radiology

## 2011-12-15 VITALS — BP 136/91 | Ht 68.0 in | Wt 182.0 lb

## 2011-12-15 DIAGNOSIS — Z8249 Family history of ischemic heart disease and other diseases of the circulatory system: Secondary | ICD-10-CM | POA: Insufficient documentation

## 2011-12-15 DIAGNOSIS — R0600 Dyspnea, unspecified: Secondary | ICD-10-CM

## 2011-12-15 DIAGNOSIS — R0609 Other forms of dyspnea: Secondary | ICD-10-CM | POA: Insufficient documentation

## 2011-12-15 DIAGNOSIS — F17200 Nicotine dependence, unspecified, uncomplicated: Secondary | ICD-10-CM | POA: Insufficient documentation

## 2011-12-15 DIAGNOSIS — R0989 Other specified symptoms and signs involving the circulatory and respiratory systems: Secondary | ICD-10-CM | POA: Insufficient documentation

## 2011-12-15 DIAGNOSIS — R079 Chest pain, unspecified: Secondary | ICD-10-CM

## 2011-12-15 DIAGNOSIS — E785 Hyperlipidemia, unspecified: Secondary | ICD-10-CM | POA: Insufficient documentation

## 2011-12-15 DIAGNOSIS — R0789 Other chest pain: Secondary | ICD-10-CM | POA: Insufficient documentation

## 2011-12-15 DIAGNOSIS — R5383 Other fatigue: Secondary | ICD-10-CM | POA: Insufficient documentation

## 2011-12-15 DIAGNOSIS — R5381 Other malaise: Secondary | ICD-10-CM | POA: Insufficient documentation

## 2011-12-15 MED ORDER — TECHNETIUM TC 99M TETROFOSMIN IV KIT
11.0000 | PACK | Freq: Once | INTRAVENOUS | Status: AC | PRN
  Administered 2011-12-15: 11 via INTRAVENOUS

## 2011-12-15 MED ORDER — TECHNETIUM TC 99M TETROFOSMIN IV KIT
33.0000 | PACK | Freq: Once | INTRAVENOUS | Status: AC | PRN
  Administered 2011-12-15: 33 via INTRAVENOUS

## 2011-12-15 NOTE — Progress Notes (Signed)
[facility] SITE 3 NUCLEAR MED [address] [place] [address] [phone]  Cardiology Nuclear Med Study  ABAGALE BOULOS is a 56 y.o. female [id number] 1956/03/21   Nuclear Med Background Indication for Stress Test:  Evaluation for Ischemia History: no prior Hx of CAD  Cardiac Risk Factors: Family History - CAD, Lipids and Smoker  Symptoms:  Doe,chest tightness, fatigue   Nuclear Pre-Procedure Caffeine/Decaff Intake:  7:30am NPO After: 7:30am   Lungs:  clear IV 0.9% NS with Angio Cath:  20g  IV Site: R Antecubital  IV Started by:  Stanton Kidney, EMT-P  Chest Size (in):  40 Cup Size: D  Height: 5\' 8"  (1.727 m)  Weight:  182 lb (82.555 kg)  BMI:  Body mass index is 27.67 kg/(m^2). Tech Comments:  NA    Nuclear Med Study 1 or 2 day study: 1 day  Stress Test Type:  Stress  Reading MD: Marca Ancona, MD  Order Authorizing Provider:  J.Paz  Resting Radionuclide: Technetium 21m Tetrofosmin  Resting Radionuclide Dose: 11.0 mCi   Stress Radionuclide:  Technetium 54m Tetrofosmin  Stress Radionuclide Dose: 33.0 mCi           Stress Protocol Rest HR: 63 Stress HR: 153  Rest BP: 136/91 Stress BP: 188/74  Exercise Time (min): 6:00 METS: 7.0   Predicted Max HR: 166 bpm % Max HR: 92.17 bpm Rate Pressure Product: 21308   Dose of Adenosine (mg):  n/a Dose of Lexiscan: n/a mg  Dose of Atropine (mg): n/a Dose of Dobutamine: n/a mcg/kg/min (at max HR)  Stress Test Technologist: Milana Na, EMT-P  Nuclear Technologist:  Doyne Keel, CNMT     Rest Procedure:  Myocardial perfusion imaging was performed at rest 45 minutes following the intravenous administration of Technetium 44m Tetrofosmin. Rest ECG: NSR  Stress Procedure:  The patient exercised for 6:00.  The patient stopped due to fatigue and denied any chest pain.  There were no significant ST-T wave changes.  Technetium 35m Tetrofosmin was injected at peak exercise and myocardial perfusion imaging was  performed after a brief delay. Stress ECG: No significant change from baseline ECG  QPS Raw Data Images:  Normal; no motion artifact; normal heart/lung ratio. Stress Images:  Normal homogeneous uptake in all areas of the myocardium. Rest Images:  Normal homogeneous uptake in all areas of the myocardium. Subtraction (SDS):  There is no evidence of scar or ischemia. Transient Ischemic Dilatation (Normal <1.22):  1.07 Lung/Heart Ratio (Normal <0.45):  0.36  Quantitative Gated Spect Images QGS EDV:  68 ml QGS ESV:  17 ml QGS cine images:  NL LV Function; NL Wall Motion QGS EF: 75%  Impression Exercise Capacity:  Fair exercise capacity. BP Response:  Normal blood pressure response. Clinical Symptoms:  Fatigue, no chest pain.  ECG Impression:  No significant ST segment change suggestive of ischemia. Comparison with Prior Nuclear Study: No images to compare  Overall Impression:  Normal stress nuclear study.  Ronaldo Crilly [employer]

## 2011-12-16 ENCOUNTER — Telehealth: Payer: Self-pay | Admitting: Internal Medicine

## 2011-12-16 NOTE — Telephone Encounter (Signed)
Spoke with pt & gave stress test results.

## 2011-12-16 NOTE — Telephone Encounter (Signed)
Advise patient, stress test negative. Very good results.

## 2011-12-18 ENCOUNTER — Ambulatory Visit (INDEPENDENT_AMBULATORY_CARE_PROVIDER_SITE_OTHER): Payer: BC Managed Care – PPO | Admitting: Internal Medicine

## 2011-12-18 DIAGNOSIS — R0989 Other specified symptoms and signs involving the circulatory and respiratory systems: Secondary | ICD-10-CM

## 2011-12-18 DIAGNOSIS — R0609 Other forms of dyspnea: Secondary | ICD-10-CM

## 2011-12-18 DIAGNOSIS — R0600 Dyspnea, unspecified: Secondary | ICD-10-CM

## 2011-12-18 LAB — PULMONARY FUNCTION TEST

## 2011-12-18 NOTE — Progress Notes (Signed)
PFT done today. 

## 2011-12-25 ENCOUNTER — Encounter: Payer: Self-pay | Admitting: Internal Medicine

## 2011-12-31 ENCOUNTER — Telehealth: Payer: Self-pay | Admitting: Internal Medicine

## 2011-12-31 NOTE — Telephone Encounter (Signed)
Advise patient, his PFTs showed mild obstructive airway disease, plan: Start spiriva one puff every day. #1 on 6 refills Work on stop tobacco Followup by May as planned

## 2012-01-01 MED ORDER — TIOTROPIUM BROMIDE MONOHYDRATE 18 MCG IN CAPS: 18.0000 ug | ORAL_CAPSULE | Freq: Every day | RESPIRATORY_TRACT | Status: DC

## 2012-01-01 NOTE — Telephone Encounter (Signed)
LMOVM for pt to return call 

## 2012-01-01 NOTE — Telephone Encounter (Signed)
Discuss with patient, Rx sent. 

## 2012-03-22 ENCOUNTER — Telehealth: Payer: Self-pay | Admitting: Internal Medicine

## 2012-03-22 NOTE — Telephone Encounter (Signed)
Call pt, she started zetia and is due for labs---->  please arrange FLP--dx hyperlipidemia

## 2012-03-23 NOTE — Telephone Encounter (Signed)
Lmovm for patient to call the office.

## 2012-03-30 NOTE — Telephone Encounter (Signed)
Lmovm

## 2012-03-31 NOTE — Telephone Encounter (Signed)
Pt scheduled for 04/06/12. 

## 2012-04-04 ENCOUNTER — Other Ambulatory Visit: Payer: Self-pay | Admitting: Internal Medicine

## 2012-04-05 NOTE — Telephone Encounter (Signed)
Ok to refill 

## 2012-04-05 NOTE — Telephone Encounter (Signed)
Ok #30, 1 RF 

## 2012-04-05 NOTE — Telephone Encounter (Signed)
Refill done.  

## 2012-04-06 ENCOUNTER — Other Ambulatory Visit (INDEPENDENT_AMBULATORY_CARE_PROVIDER_SITE_OTHER): Payer: BC Managed Care – PPO

## 2012-04-06 DIAGNOSIS — E785 Hyperlipidemia, unspecified: Secondary | ICD-10-CM

## 2012-04-06 LAB — LIPID PANEL
HDL: 55.1 mg/dL (ref >39.00)
Total CHOL/HDL Ratio: 4
Triglycerides: 150 mg/dL — ABNORMAL HIGH (ref 0.0–149.0)
VLDL: 30 mg/dL (ref 0.0–40.0)

## 2012-04-06 LAB — LDL CHOLESTEROL, DIRECT: Direct LDL: 146.1 mg/dL

## 2012-04-06 NOTE — Progress Notes (Signed)
Labs only

## 2012-04-08 ENCOUNTER — Encounter: Payer: Self-pay | Admitting: *Deleted

## 2012-05-08 ENCOUNTER — Other Ambulatory Visit: Payer: Self-pay | Admitting: Internal Medicine

## 2012-08-14 ENCOUNTER — Other Ambulatory Visit: Payer: Self-pay | Admitting: Internal Medicine

## 2012-08-16 NOTE — Telephone Encounter (Signed)
Refill done.  

## 2012-09-06 ENCOUNTER — Ambulatory Visit (INDEPENDENT_AMBULATORY_CARE_PROVIDER_SITE_OTHER): Payer: BC Managed Care – PPO | Admitting: Internal Medicine

## 2012-09-06 ENCOUNTER — Encounter: Payer: Self-pay | Admitting: Internal Medicine

## 2012-09-06 VITALS — BP 120/82 | HR 73 | Temp 97.7°F | Wt 180.0 lb

## 2012-09-06 DIAGNOSIS — J449 Chronic obstructive pulmonary disease, unspecified: Secondary | ICD-10-CM

## 2012-09-06 DIAGNOSIS — E785 Hyperlipidemia, unspecified: Secondary | ICD-10-CM

## 2012-09-06 DIAGNOSIS — F17200 Nicotine dependence, unspecified, uncomplicated: Secondary | ICD-10-CM

## 2012-09-06 DIAGNOSIS — Z720 Tobacco use: Secondary | ICD-10-CM

## 2012-09-06 MED ORDER — VARENICLINE TARTRATE 0.5 MG X 11 & 1 MG X 42 PO MISC: ORAL | Status: DC

## 2012-09-06 MED ORDER — VARENICLINE TARTRATE 1 MG PO TABS: 1.0000 mg | ORAL_TABLET | Freq: Two times a day (BID) | ORAL | Status: DC

## 2012-09-06 NOTE — Assessment & Plan Note (Addendum)
Still smoking 5-7 cigarettes a day, has use the e-cigarette without much help.. She feels is ready to try Chantix  We discussed options including Wellbutrin, Chantix, nicotine supplementation. She likes  Chantix, she has on her own research and is aware of the side effects which we discussed today as well. Plan: Chantix, quit cigarettes 2 weeks after initiation, and avoid nicotine after 2 weeks

## 2012-09-06 NOTE — Assessment & Plan Note (Signed)
She is taking Zetia, cholesterol has decreased. We also discussed about diet and exercise daily

## 2012-09-06 NOTE — Assessment & Plan Note (Addendum)
PFTs 12/2011 show mild obstructive disease, she was started on Spiriva, dyspnea on exertion has decreased significantly. Plan: Continue with the Spiriva

## 2012-09-06 NOTE — Progress Notes (Signed)
  Subjective:    Patient ID: Sheila Fuller, female    DOB: May 05, 1956, 56 y.o.   MRN: [id number]  HPI Routine office visit High cholesterol, good compliance with Zetia, no apparent side effects. Dyspnea on exertion, PFTs show some obstruction, she started Spiriva and feels better. Still smoking, 5-7 cigarettes a day, has tried with a chronic cigarette without much help. See assessment and plan.  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); no further f/u w/ specialists 01-2006---Microhematuria--saw Dr Vonita Moss, renal u/s : r pyelocaliectasis but CT was neg, cysto neg COPD  Past Surgical History: Remote "cervical cauterization" Caesarean section deviated septum  Social History: remarried 10-11, 1 child Current Smoker (up to 5 cigs daily) Alcohol use-- yes   [employer]- [employer] caffeine - 3-4 cups daily diet-   improving   exercise-- some, walks most days  Family History CAD-- brother stent at age 29 Stroke--no DM-- M Colon ca--distant aunt, age ~5 Breast ca--no  Review of Systems Already took a flu shot Diet improving Denies currently any cough, wheezing or sputum production.     Objective:   Physical Exam General -- alert, well-developed. No apparent distress.  Lungs -- normal respiratory effort, no intercostal retractions, no accessory muscle use, and normal breath sounds.   Heart-- normal rate, regular rhythm, no murmur, and no gallop.   Neurologic-- alert & oriented X3 and strength normal in all extremities. Psych-- Cognition and judgment appear intact. Alert and cooperative with normal attention span and concentration.  not anxious appearing and not depressed appearing.     Assessment & Plan:  Today , I spent more than 25 min with the patient, >50% of the time counseling

## 2012-09-15 ENCOUNTER — Other Ambulatory Visit: Payer: Self-pay | Admitting: Family Medicine

## 2012-09-15 DIAGNOSIS — R109 Unspecified abdominal pain: Secondary | ICD-10-CM

## 2012-09-17 ENCOUNTER — Other Ambulatory Visit: Payer: BC Managed Care – PPO

## 2012-10-29 ENCOUNTER — Ambulatory Visit (INDEPENDENT_AMBULATORY_CARE_PROVIDER_SITE_OTHER): Payer: BC Managed Care – PPO | Admitting: Internal Medicine

## 2012-10-29 ENCOUNTER — Encounter: Payer: Self-pay | Admitting: Internal Medicine

## 2012-10-29 VITALS — BP 128/92 | HR 75 | Temp 98.4°F | Ht 68.0 in | Wt 183.0 lb

## 2012-10-29 DIAGNOSIS — R059 Cough, unspecified: Secondary | ICD-10-CM

## 2012-10-29 DIAGNOSIS — R05 Cough: Secondary | ICD-10-CM

## 2012-10-29 MED ORDER — PHENYLEPH-PROMETHAZINE-COD 5-6.25-10 MG/5ML PO SYRP: 5.0000 mL | ORAL_SOLUTION | Freq: Every evening | ORAL | Status: DC | PRN

## 2012-10-29 NOTE — Progress Notes (Signed)
Subjective:    Patient ID: Sheila Fuller, female    DOB: 1955/11/27, 57 y.o.   MRN: [id number]  HPI  Pt presents to the clinic today with c/o flu like symptoms x 1 week. She c/o fatigue sore throat, chest congestion without any sputum production. She denies fever, chills or body aches. She has taken Mucinex with some relief. She feels like her symptoms are getting better each day. She has this hacking cough that will not go away but is much worse at night. She is not coughing anything up.  Review of Systems      Past Medical History  Diagnosis Date  . Allergic rhinitis   . OSA on CPAP 2009  . Depression   . GERD (gastroesophageal reflux disease)     dx base on cough that decreased w/ PPIs  . Hyperlipidemia   . Carcinoma in situ of perianal skin     +XRT & chemo 2000; no further f/u w/ specialist.   . Microhematuria 01/2006    saw Dr.Peterson, renal u/s: r pyelocaliectasis but CT was neg, cysto neg    Current Outpatient Prescriptions  Medication Sig Dispense Refill  . cholecalciferol (VITAMIN D) 1000 UNITS tablet Take 1,000 Units by mouth daily.        . cyclobenzaprine (FLEXERIL) 5 MG tablet take 2 tablets by mouth once daily  30 tablet  1  . cycloSPORINE (RESTASIS) 0.05 % ophthalmic emulsion Place 1 drop into both eyes 2 (two) times daily.      . diphenhydrAMINE (SOMINEX) 25 MG tablet Take 25 mg by mouth at bedtime as needed.        . ezetimibe (ZETIA) 10 MG tablet Take 1 tablet (10 mg total) by mouth daily.  30 tablet  6  . fluticasone (FLONASE) 50 MCG/ACT nasal spray instill 2 sprays into each nostril once daily  16 g  1  . GARLIC 1500 PO Take by mouth.        . Omega-3 Fatty Acids (FISH OIL) 1200 MG CAPS Take by mouth.        . RABEprazole (ACIPHEX) 20 MG tablet Take 1 tablet (20 mg total) by mouth daily.  90 tablet  1  . SPIRIVA HANDIHALER 18 MCG inhalation capsule inhale the contents of one capsule in the handihaler once daily  30 each  3  . varenicline (CHANTIX STARTING  MONTH PAK) 0.5 MG X 11 & 1 MG X 42 tablet Take one 0.5 mg tablet by mouth once daily for 3 days, then increase to one 0.5 mg tablet twice daily for 4 days, then increase to one 1 mg tablet twice daily.  53 tablet  0  . varenicline (CHANTIX) 1 MG tablet Take 1 tablet (1 mg total) by mouth 2 (two) times daily.  60 tablet  1    Allergies  Allergen Reactions  . Statins     REACTION: myalgia  . Sulfonamide Derivatives     Family History  Problem Relation Age of Onset  . Breast cancer Neg Hx   . Coronary artery disease Neg Hx   . Hypertension Neg Hx   . Stroke Neg Hx   . Sudden death Neg Hx   . Heart attack Neg Hx   . Colon cancer      aunt  . Lung cancer Father     smoker  . Diabetes Mother     late onset  . Hyperlipidemia Mother   . Cervical cancer Sister     History  Social History  . Marital Status: Married    Spouse Name: N/A    Number of Children: 1  . 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 Smoking status: Current Every Day Smoker -- 0.3 packs/day for 3 years    Types: Cigarettes  . Smokeless tobacco: Never Used     Comment: 5 cigarettes per day  . Alcohol Use: Yes  . Drug Use: No  . Sexually Active: Not on file   Other Topics Concern  . Not on file   Social History Narrative   Caffeine- 3-4 cups dailyDiet- exercise- improved, went to the bariatric clinic, was Rx HCG-appetite suppressants 9/11/-12/11     Constitutional: Pt reports fatigue. Denies fever, malaise, headache or abrupt weight changes.  HEENT: Pt reports sore throat. Denies eye pain, eye redness, ear pain, ringing in the ears, wax buildup, runny nose, nasal congestion, bloody nose. Respiratory: Pt reports cough. Denies difficulty breathing, shortness of breath, or sputum production.   Cardiovascular: Denies chest pain, chest tightness, palpitations or swelling in the hands or feet.  Gastrointestinal: Denies  abdominal pain, bloating, constipation, diarrhea or blood in the stool.    No other specific complaints in a complete review of systems (except as listed in HPI above).  Objective:   Physical Exam   BP 128/92  Pulse 75  Temp 98.4 F (36.9 C) (Oral)  Ht 5\' 8"  (1.727 m)  Wt 183 lb (83.008 kg)  BMI 27.82 kg/m2  SpO2 94% Wt Readings from Last 3 Encounters:  10/29/12 183 lb (83.008 kg)  09/06/12 180 lb (81.647 kg)  12/15/11 182 lb (82.555 kg)    General: Appears her stated age, well developed, well nourished in NAD. HEENT: Head: normal shape and size; Eyes: sclera white, no icterus, conjunctiva pink, PERRLA and EOMs intact; Ears: Tm's gray and intact, normal light reflex; Nose: mucosa pink and moist, septum midline; Throat/Mouth: Teeth present, mucosa pink and moist, no exudate, lesions or ulcerations noted.  Neck: Normal range of motion. Neck supple, trachea midline. No massses, lumps or thyromegaly present.  Cardiovascular: Normal rate and rhythm. S1,S2 noted.  No murmur, rubs or gallops noted. No JVD or BLE edema. No carotid bruits noted. Pulmonary/Chest: Normal effort and positive vesicular breath sounds. No respiratory distress. No wheezes, rales or ronchi noted.  Abdomen: Soft and nontender. Normal bowel sounds, no bruits noted. No distention or masses noted. Liver, spleen and kidneys non palpable.        Assessment & Plan:   URI, most likely viral with lingering cough, new onset with additional workup required:  eRx given for cough syrup with codeine  Drink plenty of fluids and gets some rest Take tylenol as needed for pain or fever  RTC As needed or if symptoms persist

## 2012-10-29 NOTE — Patient Instructions (Addendum)
Influenza Facts  Flu (influenza) is a contagious respiratory illness caused by the influenza viruses. It can cause mild to severe illness. While most healthy people recover from the flu without specific treatment and without complications, older people, young children, and people with certain health conditions are at higher risk for serious complications from the flu, including death.  CAUSES    The flu virus is spread from person to person by respiratory droplets from coughing and sneezing.   A person can also become infected by touching an object or surface with a virus on it and then touching their mouth, eye or nose.   Adults may be able to infect others from 1 day before symptoms occur and up to 7 days after getting sick. So it is possible to give someone the flu even before you know you are sick and continue to infect others while you are sick.  SYMPTOMS    Fever (usually high).   Headache.   Tiredness (can be extreme).   Cough.   Sore throat.   Runny or stuffy nose.   Body aches.   Diarrhea and vomiting may also occur, particularly in children.   These symptoms are referred to as "flu-like symptoms". A lot of different illnesses, including the common cold, can have similar symptoms.  DIAGNOSIS    There are tests that can determine if you have the flu as long you are tested within the first 2 or 3 days of illness.   A doctor's exam and additional tests may be needed to identify if you have a disease that is a complicating the flu.  RISKS AND COMPLICATIONS   Some of the complications caused by the flu include:   Bacterial pneumonia or progressive pneumonia caused by the flu virus.   Loss of body fluids (dehydration).   Worsening of chronic medical conditions, such as heart failure, asthma, or diabetes.   Sinus problems and ear infections.  HOME CARE INSTRUCTIONS    Seek medical care early on.   If you are at high risk from complications of the flu, consult your health-care provider as soon  as you develop flu-like symptoms. Those at high risk for complications include:   People 65 years or older.   People with chronic medical conditions, including diabetes.   Pregnant women.   Young children.   Your caregiver may recommend use of an antiviral medication to help treat the flu.   If you get the flu, get plenty of rest, drink a lot of liquids, and avoid using alcohol and tobacco.   You can take over-the-counter medications to relieve the symptoms of the flu if your caregiver approves. (Never give aspirin to children or teenagers who have flu-like symptoms, particularly fever).  PREVENTION   The single best way to prevent the flu is to get a flu vaccine each fall. Other measures that can help protect against the flu are:   Antiviral Medications   A number of antiviral drugs are approved for use in preventing the flu. These are prescription medications, and a doctor should be consulted before they are used.   Habits for Good Health   Cover your nose and mouth with a tissue when you cough or sneeze, throw the tissue away after you use it.   Wash your hands often with soap and water, especially after you cough or sneeze. If you are not near water, use an alcohol-based hand cleaner.   Avoid people who are sick.   If you get the   flu, stay home from work or school. Avoid contact with other people so that you do not make them sick, too.   Try not to touch your eyes, nose, or mouth as germs ore often spread this way.  IN CHILDREN, EMERGENCY WARNING SIGNS THAT NEED URGENT MEDICAL ATTENTION:   Fast breathing or trouble breathing.   Bluish skin color.   Not drinking enough fluids.   Not waking up or not interacting.   Being so irritable that the child does not want to be held.   Flu-like symptoms improve but then return with fever and worse cough.   Fever with a rash.  IN ADULTS, EMERGENCY WARNING SIGNS THAT NEED URGENT MEDICAL ATTENTION:   Difficulty breathing or shortness of breath.   Pain  or pressure in the chest or abdomen.   Sudden dizziness.   Confusion.   Severe or persistent vomiting.  SEEK IMMEDIATE MEDICAL CARE IF:   You or someone you know is experiencing any of the symptoms above. When you arrive at the emergency center,report that you think you have the flu. You may be asked to wear a mask and/or sit in a secluded area to protect others from getting sick.  MAKE SURE YOU:    Understand these instructions.   Monitor your condition.   Seek medical care if you are getting worse, or not improving.  Document Released: 10/16/2003 Document Revised: 01/05/2012 Document Reviewed: 07/12/2009  [employer] Patient Information 2013 [employer].

## 2012-10-30 ENCOUNTER — Encounter: Payer: Self-pay | Admitting: Internal Medicine

## 2012-11-10 ENCOUNTER — Other Ambulatory Visit: Payer: Self-pay | Admitting: *Deleted

## 2012-11-10 MED ORDER — EZETIMIBE 10 MG PO TABS: 10.0000 mg | ORAL_TABLET | Freq: Every day | ORAL | Status: DC

## 2012-11-10 MED ORDER — FLUTICASONE PROPIONATE 50 MCG/ACT NA SUSP: NASAL | Status: DC

## 2012-11-10 MED ORDER — TIOTROPIUM BROMIDE MONOHYDRATE 18 MCG IN CAPS: ORAL_CAPSULE | RESPIRATORY_TRACT | Status: DC

## 2012-11-10 NOTE — Telephone Encounter (Signed)
Rx printed and mailed to pt

## 2013-01-13 ENCOUNTER — Encounter: Payer: Self-pay | Admitting: Lab

## 2013-01-14 ENCOUNTER — Ambulatory Visit (INDEPENDENT_AMBULATORY_CARE_PROVIDER_SITE_OTHER): Payer: BC Managed Care – PPO | Admitting: Internal Medicine

## 2013-01-14 ENCOUNTER — Encounter: Payer: Self-pay | Admitting: Lab

## 2013-01-14 VITALS — BP 132/84 | HR 72 | Temp 97.8°F | Ht 68.0 in | Wt 183.0 lb

## 2013-01-14 DIAGNOSIS — Z720 Tobacco use: Secondary | ICD-10-CM

## 2013-01-14 DIAGNOSIS — Z0000 Encounter for general adult medical examination without abnormal findings: Secondary | ICD-10-CM

## 2013-01-14 DIAGNOSIS — J449 Chronic obstructive pulmonary disease, unspecified: Secondary | ICD-10-CM

## 2013-01-14 DIAGNOSIS — Z23 Encounter for immunization: Secondary | ICD-10-CM

## 2013-01-14 DIAGNOSIS — G4700 Insomnia, unspecified: Secondary | ICD-10-CM

## 2013-01-14 DIAGNOSIS — J309 Allergic rhinitis, unspecified: Secondary | ICD-10-CM

## 2013-01-14 LAB — CBC WITH DIFFERENTIAL/PLATELET
Basophils Absolute: 0 10*3/uL (ref 0.0–0.1)
Basophils Relative: 0.4 % (ref 0.0–3.0)
Eosinophils Absolute: 0.1 10*3/uL (ref 0.0–0.7)
Lymphocytes Relative: 30.5 % (ref 12.0–46.0)
MCHC: 34.6 g/dL (ref 30.0–36.0)
MCV: 87.4 fl (ref 78.0–100.0)
Monocytes Absolute: 0.3 10*3/uL (ref 0.1–1.0)
Neutrophils Relative %: 62.2 % (ref 43.0–77.0)
Platelets: 245 10*3/uL (ref 150.0–400.0)
RBC: 4.62 Mil/uL (ref 3.87–5.11)
RDW: 13.8 % (ref 11.5–14.6)

## 2013-01-14 LAB — LIPID PANEL: Triglycerides: 135 mg/dL (ref 0.0–149.0)

## 2013-01-14 LAB — COMPREHENSIVE METABOLIC PANEL
ALT: 25 U/L (ref 0–35)
AST: 20 U/L (ref 0–37)
Albumin: 4.2 g/dL (ref 3.5–5.2)
Alkaline Phosphatase: 61 U/L (ref 39–117)
Calcium: 8.9 mg/dL (ref 8.4–10.5)
Chloride: 105 mEq/L (ref 96–112)
Potassium: 3.7 mEq/L (ref 3.5–5.1)
Sodium: 139 mEq/L (ref 135–145)
Total Protein: 7.2 g/dL (ref 6.0–8.3)

## 2013-01-14 MED ORDER — ALBUTEROL SULFATE HFA 108 (90 BASE) MCG/ACT IN AERS: 2.0000 | INHALATION_SPRAY | Freq: Four times a day (QID) | RESPIRATORY_TRACT | Status: DC

## 2013-01-14 MED ORDER — DESLORATADINE 5 MG PO TABS: 5.0000 mg | ORAL_TABLET | Freq: Every day | ORAL | Status: DC

## 2013-01-14 NOTE — Assessment & Plan Note (Addendum)
During the winter had slt more sx, will add albuterol prn occ uses codeine

## 2013-01-14 NOTE — Progress Notes (Signed)
  Subjective:    Patient ID: Sheila Fuller, female    DOB: 1956-07-21, 57 y.o.   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); no further f/u w/ specialists 01-2006---Microhematuria--saw Dr Vonita Moss, renal u/s : r pyelocaliectasis but CT was neg, cysto neg COPD  Past Surgical History: Remote "cervical cauterization" Caesarean section deviated septum  Social History: remarried 10-11, 1 child Current Smoker (1-2 cigs daily) Alcohol use-- yes   [employer]- [employer] caffeine - 3-4 cups daily diet--  not good   exercise-- yoga, occ walk  Family History CAD-- brother stent at age 22 Stroke--no DM-- M Colon ca--distant aunt, age ~39 Breast ca--no   Review of Systems In general doing well, allergies has been more active lately, OTC Claritin caused dry mouth, she try Clarinex and likes it better. Needs a prescription. Also, COPD symptoms have been more consistent particularly when she walks outside, albuterol?. Denies any chest pain or sputum production. No nausea, vomiting, diarrhea. No blood in the stools. Takes Zantac daily with good control of GERD symptoms. No dysuria or gross hematuria. Depression and insomnia well-controlled with by mouth.     Objective:   Physical Exam  General -- alert, well-developed   Neck --no thyromegaly , normal carotid pulse Breasts-- No mass, nodules, thickening, tenderness, bulging, retraction, inflamation, nipple discharge or skin changes noted.  no axillary lymph nodes Lungs -- normal respiratory effort, no intercostal retractions, no accessory muscle use, and normal breath sounds.   Heart-- normal rate, regular rhythm, no murmur, and no gallop.   Abdomen--soft, non-tender, no distention, no masses, no HSM, no guarding, and no rigidity.   Extremities-- no pretibial edema bilaterally  Neurologic-- alert & oriented X3 and strength normal in all extremities. Psych-- Cognition and judgment appear intact. Alert and cooperative with normal attention span and concentration.  not anxious appearing and not depressed appearing.      Assessment & Plan:

## 2013-01-14 NOTE — Assessment & Plan Note (Signed)
Making progress! Only smoking  1-2 qd

## 2013-01-14 NOTE — Assessment & Plan Note (Signed)
Responded well to clarinex, Rx sent

## 2013-01-14 NOTE — Assessment & Plan Note (Addendum)
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

## 2013-01-14 NOTE — Assessment & Plan Note (Signed)
D/c otc sleep aid, sx better since she does yoga

## 2013-01-15 ENCOUNTER — Encounter: Payer: Self-pay | Admitting: Internal Medicine

## 2013-01-15 LAB — VITAMIN D 25 HYDROXY (VIT D DEFICIENCY, FRACTURES): Vit D, 25-Hydroxy: 40 ng/mL (ref 30–89)

## 2013-01-17 ENCOUNTER — Encounter: Payer: Self-pay | Admitting: *Deleted

## 2013-02-24 ENCOUNTER — Encounter: Payer: Self-pay | Admitting: Internal Medicine

## 2013-03-01 ENCOUNTER — Telehealth: Payer: Self-pay | Admitting: *Deleted

## 2013-03-01 MED ORDER — EZETIMIBE 10 MG PO TABS: 10.0000 mg | ORAL_TABLET | Freq: Every day | ORAL | Status: DC

## 2013-03-01 MED ORDER — FLUTICASONE PROPIONATE 50 MCG/ACT NA SUSP: NASAL | Status: DC

## 2013-03-01 NOTE — Telephone Encounter (Signed)
Pt aware Rx mailed to her as per her request

## 2013-04-20 ENCOUNTER — Other Ambulatory Visit: Payer: Self-pay

## 2013-04-20 DIAGNOSIS — Z1231 Encounter for screening mammogram for malignant neoplasm of breast: Secondary | ICD-10-CM

## 2013-05-13 ENCOUNTER — Ambulatory Visit
Admission: RE | Admit: 2013-05-13 | Discharge: 2013-05-13 | Disposition: A | Discharge status: Home / Self Care | Payer: BC Managed Care – PPO | Source: Ambulatory Visit

## 2013-05-13 DIAGNOSIS — Z1231 Encounter for screening mammogram for malignant neoplasm of breast: Secondary | ICD-10-CM

## 2013-07-06 ENCOUNTER — Telehealth: Payer: Self-pay | Admitting: Internal Medicine

## 2013-07-06 ENCOUNTER — Other Ambulatory Visit: Payer: Self-pay | Admitting: *Deleted

## 2013-07-06 MED ORDER — VARENICLINE TARTRATE 1 MG PO TABS: 1.0000 mg | ORAL_TABLET | Freq: Two times a day (BID) | ORAL | Status: DC

## 2013-07-06 MED ORDER — FLUTICASONE PROPIONATE 50 MCG/ACT NA SUSP: NASAL | Status: DC

## 2013-07-06 NOTE — Telephone Encounter (Signed)
rx refilled per protocol. DJR  

## 2013-07-06 NOTE — Telephone Encounter (Signed)
[facility] requesting Flonase, okay to refill for one year. Also, patient request one additional month of Chantix, please call patient: I sent already sent a Rx to her local pharmacy.

## 2013-07-07 ENCOUNTER — Other Ambulatory Visit: Payer: Self-pay | Admitting: *Deleted

## 2013-07-07 MED ORDER — FLUTICASONE PROPIONATE 50 MCG/ACT NA SUSP: NASAL | Status: DC

## 2013-07-12 ENCOUNTER — Other Ambulatory Visit: Payer: Self-pay | Admitting: *Deleted

## 2013-07-12 NOTE — Telephone Encounter (Signed)
A prior was started for chantix medication for patient and it was approved for #180 per tablets per year only if patient needs more of this medication it will require a quantity  Review.  Case #[id number]  Dates are  as followed 06/21/13 thru 01/08/2014.  Pharmacy was notified.  Ag cma

## 2013-08-05 ENCOUNTER — Encounter: Payer: Self-pay | Admitting: Internal Medicine

## 2013-08-05 ENCOUNTER — Ambulatory Visit (INDEPENDENT_AMBULATORY_CARE_PROVIDER_SITE_OTHER): Payer: BC Managed Care – PPO | Admitting: Internal Medicine

## 2013-08-05 VITALS — BP 133/95 | HR 86 | Temp 98.3°F | Wt 185.6 lb

## 2013-08-05 DIAGNOSIS — F17200 Nicotine dependence, unspecified, uncomplicated: Secondary | ICD-10-CM

## 2013-08-05 DIAGNOSIS — J449 Chronic obstructive pulmonary disease, unspecified: Secondary | ICD-10-CM

## 2013-08-05 DIAGNOSIS — E785 Hyperlipidemia, unspecified: Secondary | ICD-10-CM

## 2013-08-05 DIAGNOSIS — Z720 Tobacco use: Secondary | ICD-10-CM

## 2013-08-05 DIAGNOSIS — D171 Benign lipomatous neoplasm of skin and subcutaneous tissue of trunk: Secondary | ICD-10-CM | POA: Insufficient documentation

## 2013-08-05 DIAGNOSIS — D1779 Benign lipomatous neoplasm of other sites: Secondary | ICD-10-CM

## 2013-08-05 NOTE — Progress Notes (Signed)
  Subjective:    Patient ID: Sheila Fuller, female    DOB: 02-20-1956, 57 y.o.   MRN: [id number]  HPI ROV COPD--Self discontinue Spiriva, it caused dry eyes, temporarily had to use Restasis. Doing albuterol as needed, uses one or 2 sprays a week. A year ago noted a lump in the back, would like me to check it. No growth since she found it Tobacco abuse--quit 2 months ago,Using Chantix without apparent side effects  Past Medical History  Diagnosis Date  . Allergic rhinitis   . OSA on CPAP 2009  . Depression   . GERD (gastroesophageal reflux disease)     dx base on cough that decreased w/ PPIs  . Hyperlipidemia   . Carcinoma in situ of perianal skin     +XRT & chemo 2000; no further f/u w/ specialist.   . Microhematuria 01/2006    saw Dr.Peterson, renal u/s: r pyelocaliectasis but CT was neg, cysto neg  . COPD (chronic obstructive pulmonary disease)    Past Surgical History  Procedure Laterality Date  . Cervical cauterization    . Cesarean section    . Nasal septum surgery     History   Social History  . Marital Status: Married    Spouse Name: N/A    Number of Children: 1  . 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 Smoking status: Former Smoker -- 0.30 packs/day for 3 years    Types: Cigarettes  . Smokeless tobacco: Never Used     Comment: quit 04-2013 (Chantix)  . Alcohol Use: Yes     Comment: socially   . Drug Use: No  . Sexual Activity: Not on file   Other Topics Concern  . Not on file   Social History Narrative   remarried 10-11, 1 child  \  Family History CAD-- brother stent at age 38 Stroke--no DM-- M Colon ca--distant aunt, age ~20 Breast ca--no  Review of Systems No chest pain, shortness or breath No nausea, vomiting, diarrhea.  Had a flu shot at work    Objective:   Physical Exam  Skin:       BP 133/95  Pulse 86  Temp(Src) 98.3 F (36.8 C)  Wt 185 lb 9.6 oz  (84.188 kg)  BMI 28.23 kg/m2  SpO2 98% General -- alert, well-developed, NAD.  Lungs -- normal respiratory effort, no intercostal retractions, no accessory muscle use, and normal breath sounds.  Heart-- normal rate, regular rhythm, no murmur.   Psych-- Cognition and judgment appear intact. Cooperative with normal attention span and concentration. No anxious appearing , no depressed appearing.      Assessment & Plan:

## 2013-08-05 NOTE — Assessment & Plan Note (Signed)
Last FLP showed slt increased cholesterol, will recheck on RTC

## 2013-08-05 NOTE — Assessment & Plan Note (Signed)
Self discontinue Spiriva d/t dry eyes, currently on  albuterol and doing great

## 2013-08-05 NOTE — Assessment & Plan Note (Signed)
Quit ~ 8-14, still on chantix, praised!

## 2013-08-06 ENCOUNTER — Encounter: Payer: Self-pay | Admitting: Internal Medicine

## 2013-08-06 NOTE — Assessment & Plan Note (Signed)
New issue. benign features, discussed observation vs referral, elected observation, will call if changes

## 2013-08-29 ENCOUNTER — Encounter: Payer: Self-pay | Admitting: Internal Medicine

## 2013-08-29 ENCOUNTER — Ambulatory Visit (INDEPENDENT_AMBULATORY_CARE_PROVIDER_SITE_OTHER): Payer: BC Managed Care – PPO | Admitting: Internal Medicine

## 2013-08-29 VITALS — BP 130/84 | HR 71 | Temp 98.2°F

## 2013-08-29 DIAGNOSIS — R197 Diarrhea, unspecified: Secondary | ICD-10-CM

## 2013-08-29 NOTE — Patient Instructions (Signed)
Drink plenty of fluids OTC Pepto-Bismol as needed for diarrhea Call if not better in few days Call if you have severe symptoms including blood in the stools, fever, severe stomach pain, nausea, vomiting or inability to keep fluids down.       Diet for Diarrhea, Adult Frequent, runny stools (diarrhea) may be caused or worsened by food or drink. Diarrhea may be relieved by changing your diet. Since diarrhea can last up to 7 days, it is easy for you to lose too much fluid from the body and become dehydrated. Fluids that are lost need to be replaced. Along with a modified diet, make sure you drink enough fluids to keep your urine clear or pale yellow. DIET INSTRUCTIONS  Ensure adequate fluid intake (hydration): have 1 cup (8 oz) of fluid for each diarrhea episode. Avoid fluids that contain simple sugars or sports drinks, fruit juices, whole milk products, and sodas. Your urine should be clear or pale yellow if you are drinking enough fluids. Hydrate with an oral rehydration solution that you can purchase at pharmacies, retail stores, and online. You can prepare an oral rehydration solution at home by mixing the following ingredients together:    tsp table salt.   tsp baking soda.   tsp salt substitute containing potassium chloride.  1  tablespoons sugar.  1 L (34 oz) of water.  Certain foods and beverages may increase the speed at which food moves through the gastrointestinal (GI) tract. These foods and beverages should be avoided and include:  Caffeinated and alcoholic beverages.  High-fiber foods, such as raw fruits and vegetables, nuts, seeds, and whole grain breads and cereals.  Foods and beverages sweetened with sugar alcohols, such as xylitol, sorbitol, and mannitol.  Some foods may be well tolerated and may help thicken stool including:  Starchy foods, such as rice, toast, pasta, low-sugar cereal, oatmeal, grits, baked potatoes, crackers, and bagels.   Bananas.    Applesauce.  Add probiotic-rich foods to help increase healthy bacteria in the GI tract, such as yogurt and fermented milk products. RECOMMENDED FOODS AND BEVERAGES Starches Choose foods with less than 2 g of fiber per serving.  Recommended:  White, [place], and pita breads, plain rolls, buns, bagels. Plain muffins, matzo. Soda, saltine, or graham crackers. Pretzels, melba toast, zwieback. Cooked cereals made with water: cornmeal, farina, cream cereals. Dry cereals: refined corn, wheat, rice. Potatoes prepared any way without skins, refined macaroni, spaghetti, noodles, refined rice.  Avoid:  Bread, rolls, or crackers made with whole wheat, multi-grains, rye, bran seeds, nuts, or coconut. Corn tortillas or taco shells. Cereals containing whole grains, multi-grains, bran, coconut, nuts, raisins. Cooked or dry oatmeal. Coarse wheat cereals, granola. Cereals advertised as "high-fiber." Potato skins. Whole grain pasta, wild or brown rice. Popcorn. Sweet potatoes, yams. Sweet rolls, doughnuts, waffles, pancakes, sweet breads. Vegetables  Recommended: Strained tomato and vegetable juices. Most well-cooked and canned vegetables without seeds. Fresh: Tender lettuce, cucumber without the skin, cabbage, spinach, bean sprouts.  Avoid: Fresh, cooked, or canned: Artichokes, baked beans, beet greens, broccoli, Brussels sprouts, corn, kale, legumes, peas, sweet potatoes. Cooked: Green or red cabbage, spinach. Avoid large servings of any vegetables because vegetables shrink when cooked, and they contain more fiber per serving than fresh vegetables. Fruit  Recommended: Cooked or canned: Apricots, applesauce, cantaloupe, cherries, fruit cocktail, grapefruit, grapes, kiwi, mandarin oranges, peaches, pears, plums, watermelon. Fresh: Apples without skin, ripe banana, grapes, cantaloupe, cherries, grapefruit, peaches, oranges, plums. Keep servings limited to  cup or 1 piece.  Avoid: Fresh: Apples with skin,  apricots, mangoes, pears, raspberries, strawberries. Prune juice, stewed or dried prunes. Dried fruits, raisins, dates. Large servings of all fresh fruits. Protein  Recommended: Ground or well-cooked tender beef, ham, veal, lamb, pork, or poultry. Eggs. Fish, oysters, shrimp, lobster, other seafoods. Liver, organ meats.  Avoid: Tough, fibrous meats with gristle. Peanut butter, smooth or chunky. Cheese, nuts, seeds, legumes, dried peas, beans, lentils. Dairy  Recommended: Yogurt, lactose-free milk, kefir, drinkable yogurt, buttermilk, soy milk, or plain hard cheese.  Avoid: Milk, chocolate milk, beverages made with milk, such as milkshakes. Soups  Recommended: Bouillon, broth, or soups made from allowed foods. Any strained soup.  Avoid: Soups made from vegetables that are not allowed, cream or milk-based soups. Desserts and Sweets  Recommended: Sugar-free gelatin, sugar-free frozen ice pops made without sugar alcohol.  Avoid: Plain cakes and cookies, pie made with fruit, pudding, custard, cream pie. Gelatin, fruit, ice, sherbet, frozen ice pops. Ice cream, ice milk without nuts. Plain hard candy, honey, jelly, molasses, syrup, sugar, chocolate syrup, gumdrops, marshmallows. Fats and Oils  Recommended: Limit fats to less than 8 tsp per day.  Avoid: Seeds, nuts, olives, avocados. Margarine, butter, cream, mayonnaise, salad oils, plain salad dressings. Plain gravy, crisp bacon without rind. Beverages  Recommended: Water, decaffeinated teas, oral rehydration solutions, sugar-free beverages not sweetened with sugar alcohols.  Avoid: Fruit juices, caffeinated beverages (coffee, tea, soda), alcohol, sports drinks, or lemon-lime soda. Condiments  Recommended: Ketchup, mustard, horseradish, vinegar, cocoa powder. Spices in moderation: allspice, basil, bay leaves, celery powder or leaves, cinnamon, cumin powder, curry powder, ginger, mace, marjoram, onion or garlic powder, oregano, paprika,  parsley flakes, ground pepper, rosemary, sage, savory, tarragon, thyme, turmeric.  Avoid: Coconut, honey. Document Released: 01/03/2004 Document Revised: 07/07/2012 Document Reviewed: 02/27/2012 [facility] Patient Information 2014 [place], [place].

## 2013-08-29 NOTE — Progress Notes (Signed)
  Subjective:    Patient ID: Sheila Fuller, female    DOB: 19-Aug-1956, 57 y.o.   MRN: [id number]  HPI Acute visit Developed diarrhea about 6 days ago, having up to 5 bowel movements a day, stools are loose, light brown. Denies any blood in the stools, some mucus?. Denies known sick contacts, has not eaten anything unusual  Past Medical History  Diagnosis Date  . Allergic rhinitis   . OSA on CPAP 2009  . Depression   . GERD (gastroesophageal reflux disease)     dx base on cough that decreased w/ PPIs  . Hyperlipidemia   . Carcinoma in situ of perianal skin     +XRT & chemo 2000; no further f/u w/ specialist.   . Microhematuria 01/2006    saw Dr.Peterson, renal u/s: r pyelocaliectasis but CT was neg, cysto neg  . COPD (chronic obstructive pulmonary disease)    Past Surgical History  Procedure Laterality Date  . Cervical cauterization    . Cesarean section    . Nasal septum surgery       Review of Systems No abdominal pain, nausea x1, no vomiting. No fever or chills.     Objective:   Physical Exam BP 130/84  Pulse 71  Temp(Src) 98.2 F (36.8 C)  SpO2 97% General -- alert, well-developed, NAD.   HEENT-- Not pale.  Membranes well hydrated Lungs -- normal respiratory effort, no intercostal retractions, no accessory muscle use, and normal breath sounds.  Heart-- normal rate, regular rhythm, no murmur.  Abdomen-- Not distended.Slightly increased bowel sounds,soft, non-tender. No rebound or rigidity.   Extremities-- no pretibial edema bilaterally  Neurologic--  alert & oriented X3. Speech normal, gait normal, strength normal in all extremities.  Psych-- Cognition and judgment appear intact. Cooperative with normal attention span and concentration. No anxious appearing , no depressed appearing.     Assessment & Plan:  Acute diarrhea, Acute diarrhea with no red flag symptoms, I saw a pt few days ago with similar symptoms, viral diarrhea?. Plan: Conservative treatment, see   Instructions.

## 2013-09-02 ENCOUNTER — Other Ambulatory Visit: Payer: Self-pay | Admitting: *Deleted

## 2013-09-02 DIAGNOSIS — J309 Allergic rhinitis, unspecified: Secondary | ICD-10-CM

## 2013-09-02 MED ORDER — DESLORATADINE 5 MG PO TABS: 5.0000 mg | ORAL_TABLET | Freq: Every day | ORAL | Status: DC

## 2013-09-02 NOTE — Telephone Encounter (Signed)
Desloratadine refill sent to pharmacy

## 2013-10-04 ENCOUNTER — Ambulatory Visit: Payer: BC Managed Care – PPO | Admitting: Internal Medicine

## 2013-10-04 ENCOUNTER — Ambulatory Visit (INDEPENDENT_AMBULATORY_CARE_PROVIDER_SITE_OTHER): Payer: BC Managed Care – PPO | Admitting: Internal Medicine

## 2013-10-04 ENCOUNTER — Encounter: Payer: Self-pay | Admitting: Internal Medicine

## 2013-10-04 VITALS — BP 109/75 | HR 81 | Temp 98.3°F | Wt 191.0 lb

## 2013-10-04 DIAGNOSIS — F17200 Nicotine dependence, unspecified, uncomplicated: Secondary | ICD-10-CM

## 2013-10-04 DIAGNOSIS — E739 Lactose intolerance, unspecified: Secondary | ICD-10-CM | POA: Insufficient documentation

## 2013-10-04 DIAGNOSIS — Z720 Tobacco use: Secondary | ICD-10-CM

## 2013-10-04 DIAGNOSIS — E785 Hyperlipidemia, unspecified: Secondary | ICD-10-CM

## 2013-10-04 HISTORY — DX: Lactose intolerance, unspecified: E73.9

## 2013-10-04 NOTE — Progress Notes (Signed)
   Subjective:    Patient ID: Sheila Fuller, female    DOB: [birth year], 57 y.o.   MRN: [id number]  HPI Was seen last month with acute diarrhea,  since then she developed on-off symptoms. She then realized she was taking Chantix, zetia and there was some relationship between dairy intake and diarrhea so  stopped Chantix, Zetia and is avoiding dairy products;  Has been asymptomatic for 2 weeks.  Past Medical History  Diagnosis Date  . Allergic rhinitis   . OSA on CPAP 2009  . Depression   . GERD (gastroesophageal reflux disease)     dx base on cough that decreased w/ PPIs  . Hyperlipidemia   . Carcinoma in situ of perianal skin     +XRT & chemo 2000; no further f/u w/ specialist.   . Microhematuria 01/2006    saw Dr.Peterson, renal u/s: r pyelocaliectasis but CT was neg, cysto neg  . COPD (chronic obstructive pulmonary disease)    Past Surgical History  Procedure Laterality Date  . Cervical cauterization    . Cesarean section    . Nasal septum surgery     History   Social History  . Marital Status: Married    Spouse Name: N/A    Number of Children: 1  . 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 Smoking status: Former Smoker -- 0.30 packs/day for 3 years    Types: Cigarettes  . Smokeless tobacco: Never Used     Comment: quit 04-2013 (Chantix)  . Alcohol Use: Yes     Comment: socially   . Drug Use: No  . Sexual Activity: Not on file   Other Topics Concern  . Not on file   Social History Narrative   remarried 10-11, 1 child     Review of Systems Denies fever or chills. Not weight loss. No  nausea, vomiting, blood in the stools.     Objective:   Physical Exam  BP 109/75  Pulse 81  Temp(Src) 98.3 F (36.8 C)  Wt 191 lb (86.637 kg)  SpO2 96% General -- alert, well-developed, NAD.  HEENT-- Not pale.   Abdomen-- Not distended, good bowel sounds,soft, non-tender.  Extremities--  no pretibial edema bilaterally  Neurologic--  alert & oriented X3.   Psych-- Cognition and judgment appear intact. Cooperative with normal attention span and concentration. No anxious appearing , no depressed appearing.     Assessment & Plan:

## 2013-10-04 NOTE — Patient Instructions (Signed)
  Next visit for a physical exam  fasting, by 01-14-14 Restart zetia 2-3 weeks before the physical   Get lactaid and use as needed   Lactose Intolerance, Adult Lactose intolerance is when the body is not able to digest lactose, a sugar found in milk and milk products. Lactose intolerance is caused by your body not producing enough of the enzyme lactase. When there is not enough lactase to digest the amount of lactose consumed, discomfort may be felt. Lactose intolerance is not a milk allergy. For most people, lactase deficiency is a condition that develops naturally over time. After about the age of 2, the body begins to produce less lactase. But many people may not experience symptoms until they are much older. CAUSES Things that can cause you to be lactose intolerant include:  Aging.  Being born without the ability to make lactase.  Certain digestive diseases.  Injuries to the small intestine. SYMPTOMS   Feeling sick to your stomach (nauseous).  Diarrhea.  Cramps.  Bloating.  Gas. Symptoms usually show up a half hour or 2 hours after eating or drinking products containing lactose. TREATMENT  No treatment can improve the body's ability to produce lactase. However, symptoms can be controlled through diet. A medicine may be given to you to take when you consume lactose-containing foods or drinks. The medicine contains the lactase enzyme, which help the body digest lactose better. HOME CARE INSTRUCTIONS  Eat or drink dairy products as told by your caregiver or dietician.  Take all medicine as directed by your caregiver.  Find lactose-free or lactose-reduced products at your local grocery store.  Talk to your caregiver or dietician to decide if you need any dietary supplements. The following is the amount of calcium needed from the diet:  19 to 50 years: 1000 mg  Over 50 years: 1200 mg Calcium and Lactose in Common Foods Non-Dairy Products / Calcium Content  (mg)  Calcium-fortified orange juice, 1 cup / 308 to 344 mg  Sardines, with edible bones, 3 oz / 270 mg  Salmon, canned, with edible bones, 3 oz / 205 mg  Soymilk, fortified, 1 cup / 200 mg  Broccoli (raw), 1 cup / 90 mg  Orange, 1 medium / 50 mg  Pinto beans,  cup / 40 mg  Tuna, canned, 3 oz / 10 mg  Lettuce greens,  cup / 10 mg Dairy Products / Calcium Content (mg) / Lactose Content (g)  Yogurt, plain, low-fat, 1 cup / 415 mg / 5 g  Milk, reduced fat, 1 cup / 295 mg / 11 g  Swiss cheese, 1 oz / 270 mg / 1 g  Ice cream,  cup / 85 mg / 6 g  Cottage cheese,  cup / 75 mg / 2 to 3 g SEEK MEDICAL CARE IF: You have no relief from your symptoms. Document Released: 10/13/2005 Document Revised: 01/05/2012 Document Reviewed: 01/10/2011 [facility] Patient Information 2014 [place], [place].

## 2013-10-04 NOTE — Progress Notes (Signed)
Pre visit review using our clinic review tool, if applicable. No additional management support is needed unless otherwise documented below in the visit note. 

## 2013-10-04 NOTE — Assessment & Plan Note (Addendum)
Recently had diarrhea, she is now better since she stopped Zetia, Chantix and is avoiding dairy products. She is likely lactose intolerance and zetia may be playing a role on her symptoms. Last  colonoscopy was in 2012, + Polyps Plan:  Continue avoiding dairy products, use Lactaid as needed. Reintroduce Zetia in few weeks. See  Instructions.

## 2013-10-04 NOTE — Assessment & Plan Note (Signed)
Having issues with diarrhea, related to either lactose intolerance or Zetia. Plan: Hold Zetia for now, re- start it in few weeks before her next physical

## 2013-10-04 NOTE — Assessment & Plan Note (Signed)
Still doing really well, she is now off Chantix, see history of present illness ; does not feel she needs to go back on it at this point

## 2014-01-13 ENCOUNTER — Other Ambulatory Visit: Payer: Self-pay | Admitting: Internal Medicine

## 2014-03-13 ENCOUNTER — Ambulatory Visit (INDEPENDENT_AMBULATORY_CARE_PROVIDER_SITE_OTHER): Payer: BC Managed Care – PPO | Admitting: Internal Medicine

## 2014-03-13 ENCOUNTER — Encounter: Payer: Self-pay | Admitting: Internal Medicine

## 2014-03-13 VITALS — BP 119/81 | HR 77 | Temp 98.2°F | Wt 192.0 lb

## 2014-03-13 DIAGNOSIS — J309 Allergic rhinitis, unspecified: Secondary | ICD-10-CM

## 2014-03-13 DIAGNOSIS — M1990 Unspecified osteoarthritis, unspecified site: Secondary | ICD-10-CM

## 2014-03-13 DIAGNOSIS — E739 Lactose intolerance, unspecified: Secondary | ICD-10-CM

## 2014-03-13 MED ORDER — FLUTICASONE PROPIONATE 50 MCG/ACT NA SUSP
NASAL | Status: DC
Start: 2014-03-13 — End: 2014-08-10

## 2014-03-13 MED ORDER — NAPROXEN 500 MG PO TABS: 500.0000 mg | ORAL_TABLET | Freq: Two times a day (BID) | ORAL | Status: DC | PRN

## 2014-03-13 NOTE — Assessment & Plan Note (Signed)
RF flonase

## 2014-03-13 NOTE — Patient Instructions (Signed)
Naproxen as prescribed take  as needed for pain. Always take it with food because may cause gastritis and ulcers. If you notice nausea, stomach pain, change in the color of stools --->  Stop the medicine and let us know  At some point this year the clinic will relocate to  [place],  corner of [address] (10 minutes form here)  [place], [place] [address] [phone]   Next visit is for a physical exam in 3-4 months , fasting Please make an appointment

## 2014-03-13 NOTE — Assessment & Plan Note (Addendum)
Pt held all meds, figure out diarrhea was not likely from dairy products but maybe from vit d or zantac. rec to take zantac and vit d prn and see how she does . Apparently diarrhea was not related to zetia

## 2014-03-13 NOTE — Assessment & Plan Note (Signed)
occ hand pain, well managed w/ naproxen w/o s/e. Request a rx, it was provided, gi s.e discussed

## 2014-03-13 NOTE — Progress Notes (Signed)
Subjective:    Patient ID: Sheila Fuller, female    DOB: [birth year], 57 y.o.   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    ROS Denies chest pain, difficulty breathing. No nausea, vomiting, blood in the stools  Past Medical History  Diagnosis Date  . Allergic rhinitis   . OSA on CPAP 2009  . Depression   . GERD (gastroesophageal reflux disease)     dx base on cough that decreased w/ PPIs  . Hyperlipidemia   . Carcinoma in situ of perianal skin     +XRT & chemo 2000; no further f/u w/ specialist.   . Microhematuria 01/2006    saw Dr.Peterson, renal u/s: r pyelocaliectasis but CT was neg, cysto neg  . COPD (chronic obstructive pulmonary disease)   . Lactose intolerance 10/04/2013    Past Surgical History  Procedure Laterality Date  . Cervical cauterization    . Cesarean section    . Nasal septum surgery      History   Social History  . Marital Status: Married    Spouse Name: N/A    Number of Children: 1  . 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 Smoking status: Former Smoker -- 0.30 packs/day for 3 years    Types: Cigarettes  . Smokeless tobacco: Never Used     Comment: quit 04-2013 (Chantix)  . Alcohol Use: Yes     Comment: socially   . Drug Use: No  . Sexual Activity: Not on file   Other Topics Concern  . Not on file   Social History Narrative   remarried 10-11, 1 child        Medication List       This list is accurate as of: 03/13/14 11:59 PM.  Always use your most recent med list.               albuterol 108 (90 BASE) MCG/ACT inhaler  Commonly known as:  VENTOLIN HFA  Inhale 2 puffs by mouth four times a day if needed. DUE for physical [phone] please  schedule for additional refills.     cholecalciferol 1000 UNITS tablet  Commonly known as:  VITAMIN D  Take 1,000 Units by mouth daily.     cyclobenzaprine 5 MG tablet  Commonly known as:  FLEXERIL  take 2 tablets by mouth once daily     desloratadine 5 MG tablet  Commonly known as:  CLARINEX  Take 1 tablet (5 mg total) by mouth daily.     ezetimibe 10 MG tablet  Commonly known as:  ZETIA  Take 1 tablet (10 mg total) by mouth daily.     fluticasone 50 MCG/ACT nasal spray  Commonly known as:  FLONASE  instill 2 sprays into each nostril once daily     naproxen 500 MG tablet  Commonly known as:  NAPROSYN  Take 1 tablet (500 mg total) by mouth 3 times/day as needed-between meals & bedtime.     ranitidine 75 MG tablet  Commonly known as:  ZANTAC  Take 75 mg by mouth at bedtime.           Objective:   Physical Exam BP 119/81  Pulse 77  Temp(Src) 98.2 F (36.8 C) (Oral)  Wt 192 lb (87.091 kg)  SpO2 97% General -- alert, well-developed, NAD.  Lungs -- normal respiratory effort, no intercostal retractions, no accessory muscle use, and normal breath sounds.  Heart-- normal rate, regular rhythm, no murmur.   Extremities-- no pretibial edema bilaterally  Neurologic--  alert & oriented X3. Speech normal, gait normal, strength normal in all extremities.  Psych-- Cognition and judgment appear intact. Cooperative with normal attention span and concentration. No anxious or depressed appearing.     Assessment & Plan:

## 2014-05-07 ENCOUNTER — Other Ambulatory Visit: Payer: Self-pay | Admitting: Internal Medicine

## 2014-05-12 ENCOUNTER — Encounter: Payer: Self-pay | Admitting: Internal Medicine

## 2014-05-12 ENCOUNTER — Ambulatory Visit (INDEPENDENT_AMBULATORY_CARE_PROVIDER_SITE_OTHER): Payer: BC Managed Care – PPO | Admitting: Internal Medicine

## 2014-05-12 VITALS — BP 114/77 | HR 82 | Temp 97.8°F | Wt 193.0 lb

## 2014-05-12 DIAGNOSIS — N390 Urinary tract infection, site not specified: Secondary | ICD-10-CM

## 2014-05-12 DIAGNOSIS — F341 Dysthymic disorder: Secondary | ICD-10-CM

## 2014-05-12 DIAGNOSIS — F329 Major depressive disorder, single episode, unspecified: Secondary | ICD-10-CM

## 2014-05-12 DIAGNOSIS — F419 Anxiety disorder, unspecified: Principal | ICD-10-CM

## 2014-05-12 DIAGNOSIS — F32A Depression, unspecified: Secondary | ICD-10-CM

## 2014-05-12 LAB — POCT URINALYSIS DIPSTICK
Bilirubin, UA: NEGATIVE
Glucose, UA: NEGATIVE
Ketones, UA: NEGATIVE
NITRITE UA: NEGATIVE
Spec Grav, UA: 1.01
UROBILINOGEN UA: 0.2
pH, UA: 5

## 2014-05-12 MED ORDER — FLUOXETINE HCL 20 MG PO TABS
20.0000 mg | ORAL_TABLET | Freq: Every day | ORAL | Status: DC
Start: 2014-05-12 — End: 2014-08-10

## 2014-05-12 NOTE — Progress Notes (Signed)
Subjective:    Patient ID: Sheila Fuller, female    DOB: [birth year], 58 y.o.   MRN: [id number]  DOS:  05/12/2014 Type of visit - description: routine History: + Stress at  work, lately has been very anxious, she falls asleep okay but if for whatever reason she wakes up has a hard time falling asleep again b/c "can't turn my brain off". Also, several days history of urinary urgency, historically  that is   a sign a UTI for her.  ROS No fever or chills No lower abdominal discomfort,; no flank pain No depression per se, no suicidal ideas No dysuria, difficulty urinating or gross hematuria  Past Medical History  Diagnosis Date  . Allergic rhinitis   . OSA on CPAP 2009  . Depression   . GERD (gastroesophageal reflux disease)     dx base on cough that decreased w/ PPIs  . Hyperlipidemia   . Carcinoma in situ of perianal skin     +XRT & chemo 2000; no further f/u w/ specialist.   . Microhematuria 01/2006    saw Dr.Peterson, renal u/s: r pyelocaliectasis but CT was neg, cysto neg  . COPD (chronic obstructive pulmonary disease)   . Lactose intolerance 10/04/2013    Past Surgical History  Procedure Laterality Date  . Cervical cauterization    . Cesarean section    . Nasal septum surgery      History   Social History  . Marital Status: Married    Spouse Name: N/A    Number of Children: 1  . 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 Smoking status: Former Smoker -- 0.30 packs/day for 3 years    Types: Cigarettes  . Smokeless tobacco: Never Used     Comment: quit 04-2013 (Chantix)  . Alcohol Use: Yes     Comment: socially   . Drug Use: No  . Sexual Activity: Not on file   Other Topics Concern  . Not on file   Social History Narrative   remarried 10-11, 1 child        Medication List       This list is accurate as of: 05/12/14 11:59 PM.  Always use your most recent med list.                 albuterol 108 (90 BASE) MCG/ACT inhaler  Commonly known as:  VENTOLIN HFA  Inhale 2 puffs by mouth four times a day if needed. DUE for physical [phone] please schedule for additional refills.     cholecalciferol 1000 UNITS tablet  Commonly known as:  VITAMIN D  Take 1,000 Units by mouth daily.     cyclobenzaprine 5 MG tablet  Commonly known as:  FLEXERIL  take 2 tablets by mouth once daily     desloratadine 5 MG tablet  Commonly known as:  CLARINEX  TAKE 1 TABLET DAILY     ezetimibe 10 MG tablet  Commonly known as:  ZETIA  Take 1 tablet (10 mg total) by mouth daily.     FLUoxetine 20 MG tablet  Commonly known as:  PROZAC  Take 1 tablet (20 mg total) by mouth daily.     fluticasone 50 MCG/ACT nasal spray  Commonly known as:  FLONASE  instill 2 sprays into each nostril once daily     naproxen 500 MG tablet  Commonly known as:  NAPROSYN  Take  1 tablet (500 mg total) by mouth 3 times/day as needed-between meals & bedtime.     ranitidine 75 MG tablet  Commonly known as:  ZANTAC  Take 75 mg by mouth at bedtime.           Objective:   Physical Exam BP 114/77  Pulse 82  Temp(Src) 97.8 F (36.6 C)  Wt 193 lb (87.544 kg)  SpO2 95% General -- alert, well-developed, NAD.   Abdomen-- Not distended, good bowel sounds,soft, non-tender. Neurologic--  alert & oriented X3. Speech normal, gait appropriate for age, strength symmetric and appropriate for age.  Psych-- Cognition and judgment appear intact. Cooperative with normal attention span and concentration. No anxious or depressed appearing.        Assessment & Plan:    Today , I spent more than 25   min with the patient: >50% of the time counseling regards anxiety, different SRRI options

## 2014-05-12 NOTE — Assessment & Plan Note (Addendum)
Patient with history of previous depression presents now with anxiety. She is already trying to increase her exercise and doing other  things like yoga and relaxation techniques. We discussed SSRIs, she likes to proceed w/ the one  less likely to cause weight gain. Plan: Prozac, call if side effects.

## 2014-05-12 NOTE — Assessment & Plan Note (Addendum)
Pt w/  history of microscopic hematuria presents with urinary urgency for a few days sx usually associated to a  UTI   Udip + Plan:Urine culture, treat with results

## 2014-05-12 NOTE — Progress Notes (Signed)
Pre visit review using our clinic review tool, if applicable. No additional management support is needed unless otherwise documented below in the visit note. 

## 2014-05-12 NOTE — Patient Instructions (Signed)
Fluoxetine: 1/o tablet every AM x One week Then 1 tablet a day  Drink plenty of fluids Call if the urine symptoms increase  Next visit is for a physical exam in 4-5 weeks ,  fasting Please make an appointment

## 2014-05-15 LAB — URINE CULTURE: Colony Count: 40000

## 2014-05-15 MED ORDER — CIPROFLOXACIN HCL 500 MG PO TABS: 500.0000 mg | ORAL_TABLET | Freq: Two times a day (BID) | ORAL | Status: DC

## 2014-05-15 NOTE — Addendum Note (Signed)
Addended by: Peggyann Shoals on: 05/15/2014 10:49 AM   Modules accepted: Orders

## 2014-06-29 ENCOUNTER — Other Ambulatory Visit: Payer: Self-pay

## 2014-06-29 DIAGNOSIS — Z1231 Encounter for screening mammogram for malignant neoplasm of breast: Secondary | ICD-10-CM

## 2014-07-07 ENCOUNTER — Ambulatory Visit
Admission: RE | Admit: 2014-07-07 | Discharge: 2014-07-07 | Disposition: A | Discharge status: Home / Self Care | Payer: BC Managed Care – PPO | Source: Ambulatory Visit

## 2014-07-07 DIAGNOSIS — Z1231 Encounter for screening mammogram for malignant neoplasm of breast: Secondary | ICD-10-CM

## 2014-08-09 ENCOUNTER — Other Ambulatory Visit (HOSPITAL_COMMUNITY): Payer: Self-pay | Admitting: Orthopaedic Surgery

## 2014-08-10 ENCOUNTER — Encounter (HOSPITAL_COMMUNITY): Payer: Self-pay | Admitting: Pharmacy Technician

## 2014-08-16 NOTE — H&P (Signed)
Sheila Fuller is an 58 y.o. female.   Chief Complaint: neck pain and hand numbness HPI: Patient with several months of  neck pain with hand numbness.  X-rays demonstrate significant spondylosis with spondylolisthesis.  She has had anti-inflammatories without relief. She denies any lower extremity numbness or tingling. No balance dysfunction or bowel and bladder symptoms.  Sometimes with forward flexion she feels pain shoot down to her sacrum.    IMAGING:  MRI scan from 08/07/2014 shows moderate left C4 foraminal stenosis from facet hypertrophy at the C3-4 level.  There is more severe disease at C5-6 with severe cord flattening on the left with spinal stenosis.  Mild stenosis and bulging at C6-7.  PLAN: Discussed options and reviewed the scan. Pt is having progressive and increasing pain and has significant cervical stenosis with cord compression at the C5-6 level. Pt will require surgical intervention and wishes to proceed.  Past Medical History  Diagnosis Date  . Allergic rhinitis   . OSA on CPAP 2009  . Depression   . GERD (gastroesophageal reflux disease)     dx base on cough that decreased w/ PPIs  . Hyperlipidemia   . Carcinoma in situ of perianal skin     +XRT & chemo 2000; no further f/u w/ specialist.   . Microhematuria 01/2006    saw Dr.Peterson, renal u/s: r pyelocaliectasis but CT was neg, cysto neg  . COPD (chronic obstructive pulmonary disease)   . Lactose intolerance 10/04/2013    Past Surgical History  Procedure Laterality Date  . Cervical cauterization    . Cesarean section    . Nasal septum surgery      Family History  Problem Relation Age of Onset  . Breast cancer Neg Hx   . Coronary artery disease Neg Hx   . Hypertension Neg Hx   . Stroke Neg Hx   . Sudden death Neg Hx   . Heart attack Neg Hx   . Colon cancer      aunt  . Lung cancer Father     smoker  . Diabetes Mother     late onset  . Hyperlipidemia Mother   . Cervical cancer Sister    Social  History:  reports that she has quit smoking. Her smoking use included Cigarettes. She has a .9 pack-year smoking history. She has never used smokeless tobacco. She reports that she drinks alcohol. She reports that she does not use illicit drugs.  Allergies:  Allergies  Allergen Reactions  . Statins     REACTION: myalgia  . Sulfonamide Derivatives Rash    No prescriptions prior to admission    No results found for this or any previous visit (from the past 48 hour(s)). No results found.  Review of Systems  Musculoskeletal: Positive for neck pain.  Neurological: Positive for tingling and sensory change.       Bilateral hands  All other systems reviewed and are negative.   There were no vitals taken for this visit. Physical Exam  Constitutional: She is oriented to person, place, and time. She appears well-developed and well-nourished.  HENT:  Head: Normocephalic and atraumatic.  Eyes: EOM are normal. Pupils are equal, round, and reactive to light.  Neck:  Painful ROM  Cardiovascular: Normal rate.   Respiratory: Effort normal.  GI: Soft.  Musculoskeletal:   Patient has bilateral brachial plexus tenderness.  Increased pain with compression, relief with distraction.  Positive Spurling, both right and left.  Upper and lower extremities are 3+ and  hyperreflexic.  No clonus.    Neurological: She is alert and oriented to person, place, and time.  Skin: Skin is warm and dry.     Assessment/Plan Cervical stenosis and cord compression at C5-6  PLAN:  Anterior cervical discectomy and fusion at C5- 6 with allograft, plate and screws.  Sheila Fuller M 08/16/2014, 11:47 AM

## 2014-08-17 NOTE — Pre-Procedure Instructions (Signed)
HADLEY SOILEAU  08/17/2014   Your procedure is scheduled on:  Wednesday, October 28th  Report to [facility] Admitting at 1030 AM.  Call this number if you have problems the morning of surgery: [phone]   Remember:   Do not eat food or drink liquids after midnight.   Take these medicines the morning of surgery with A SIP OF WATER: prozac, flonase, zantac, albuterol if needed   Do not wear jewelry, make-up or nail polish.  Do not wear lotions, powders, or perfumes. You may wear deodorant.  Do not shave 48 hours prior to surgery. Men may shave face and neck.  Do not bring valuables to the hospital.  [facility] is not responsible for any belongings or valuables.               Contacts, dentures or bridgework may not be worn into surgery.  Leave suitcase in the car. After surgery it may be brought to your room.  For patients admitted to the hospital, discharge time is determined by your  treatment team.               Patients discharged the day of surgery will not be allowed to drive home.  Please read over the following fact sheets that you were given: Pain Booklet, Coughing and Deep Breathing, MRSA Information and Surgical Site Infection Prevention [place] - Preparing for Surgery  Before surgery, you can play an important role.  Because skin is not sterile, your skin needs to be as free of germs as possible.  You can reduce the number of germs on you skin by washing with CHG (chlorahexidine gluconate) soap before surgery.  CHG is an antiseptic cleaner which kills germs and bonds with the skin to continue killing germs even after washing.  Please DO NOT use if you have an allergy to CHG or antibacterial soaps.  If your skin becomes reddened/irritated stop using the CHG and inform your nurse when you arrive at Short Stay.  Do not shave (including legs and underarms) for at least 48 hours prior to the first CHG shower.  You may shave your face.  Please follow these  instructions carefully:   1.  Shower with CHG Soap the night before surgery and the morning of Surgery.  2.  If you choose to wash your hair, wash your hair first as usual with your normal shampoo.  3.  After you shampoo, rinse your hair and body thoroughly to remove the shampoo.  4.  Use CHG as you would any other liquid soap.  You can apply CHG directly to the skin and wash gently with scrungie or a clean washcloth.  5.  Apply the CHG Soap to your body ONLY FROM THE NECK DOWN.  Do not use on open wounds or open sores.  Avoid contact with your eyes, ears, mouth and genitals (private parts).  Wash genitals (private parts) with your normal soap.  6.  Wash thoroughly, paying special attention to the area where your surgery will be performed.  7.  Thoroughly rinse your body with warm water from the neck down.  8.  DO NOT shower/wash with your normal soap after using and rinsing off the CHG Soap.  9.  Pat yourself dry with a clean towel.            10.  Wear clean pajamas.            11.  Place clean sheets on your bed  the night of your first shower and do not sleep with pets.  Day of Surgery  Do not apply any lotions/deoderants the morning of surgery.  Please wear clean clothes to the hospital/surgery center.

## 2014-08-18 ENCOUNTER — Encounter (HOSPITAL_COMMUNITY)
Admission: RE | Admit: 2014-08-18 | Discharge: 2014-08-18 | Disposition: A | Discharge status: Home / Self Care | Payer: BC Managed Care – PPO | Source: Ambulatory Visit | Attending: Orthopaedic Surgery | Admitting: Orthopaedic Surgery

## 2014-08-18 ENCOUNTER — Encounter (HOSPITAL_COMMUNITY): Payer: Self-pay

## 2014-08-18 ENCOUNTER — Other Ambulatory Visit (HOSPITAL_COMMUNITY): Payer: BC Managed Care – PPO

## 2014-08-18 ENCOUNTER — Inpatient Hospital Stay (HOSPITAL_COMMUNITY): Admission: RE | Admit: 2014-08-18 | Payer: BC Managed Care – PPO | Source: Ambulatory Visit

## 2014-08-18 DIAGNOSIS — K219 Gastro-esophageal reflux disease without esophagitis: Secondary | ICD-10-CM | POA: Diagnosis not present

## 2014-08-18 DIAGNOSIS — J45909 Unspecified asthma, uncomplicated: Secondary | ICD-10-CM | POA: Insufficient documentation

## 2014-08-18 DIAGNOSIS — Z01818 Encounter for other preprocedural examination: Secondary | ICD-10-CM | POA: Diagnosis not present

## 2014-08-18 DIAGNOSIS — M4802 Spinal stenosis, cervical region: Secondary | ICD-10-CM | POA: Diagnosis not present

## 2014-08-18 HISTORY — DX: Unspecified osteoarthritis, unspecified site: M19.90

## 2014-08-18 HISTORY — DX: Unspecified asthma, uncomplicated: J45.909

## 2014-08-18 HISTORY — DX: Shortness of breath: R06.02

## 2014-08-18 HISTORY — DX: Malignant (primary) neoplasm, unspecified: C80.1

## 2014-08-18 HISTORY — DX: Anxiety disorder, unspecified: F41.9

## 2014-08-18 HISTORY — DX: Sleep apnea, unspecified: G47.30

## 2014-08-18 LAB — PROTIME-INR
INR: 0.98 (ref 0.00–1.49)
PROTHROMBIN TIME: 13.1 s (ref 11.6–15.2)

## 2014-08-18 LAB — URINE MICROSCOPIC-ADD ON

## 2014-08-18 LAB — COMPREHENSIVE METABOLIC PANEL
ALT: 20 U/L (ref 0–35)
ANION GAP: 13 (ref 5–15)
AST: 21 U/L (ref 0–37)
Albumin: 4 g/dL (ref 3.5–5.2)
Alkaline Phosphatase: 71 U/L (ref 39–117)
BUN: 15 mg/dL (ref 6–23)
CO2: 25 meq/L (ref 19–32)
CREATININE: 0.77 mg/dL (ref 0.50–1.10)
Calcium: 9.1 mg/dL (ref 8.4–10.5)
Chloride: 103 mEq/L (ref 96–112)
GFR calc non Af Amer: >90 mL/min (ref >90)
GLUCOSE: 92 mg/dL (ref 70–99)
Potassium: 4.4 mEq/L (ref 3.7–5.3)
Sodium: 141 mEq/L (ref 137–147)
Total Bilirubin: 0.3 mg/dL (ref 0.3–1.2)
Total Protein: 7 g/dL (ref 6.0–8.3)

## 2014-08-18 LAB — CBC
HCT: 41.2 % (ref 36.0–46.0)
Hemoglobin: 14.1 g/dL (ref 12.0–15.0)
MCH: 29.7 pg (ref 26.0–34.0)
MCHC: 34.2 g/dL (ref 30.0–36.0)
MCV: 86.9 fL (ref 78.0–100.0)
PLATELETS: 230 10*3/uL (ref 150–400)
RBC: 4.74 MIL/uL (ref 3.87–5.11)
RDW: 12.9 % (ref 11.5–15.5)
WBC: 5.4 10*3/uL (ref 4.0–10.5)

## 2014-08-18 LAB — SURGICAL PCR SCREEN
MRSA, PCR: NEGATIVE
Staphylococcus aureus: NEGATIVE

## 2014-08-18 LAB — URINALYSIS, ROUTINE W REFLEX MICROSCOPIC
BILIRUBIN URINE: NEGATIVE
Glucose, UA: NEGATIVE mg/dL
KETONES UR: NEGATIVE mg/dL
Leukocytes, UA: NEGATIVE
Nitrite: NEGATIVE
Protein, ur: NEGATIVE mg/dL
Specific Gravity, Urine: 1.012 (ref 1.005–1.030)
UROBILINOGEN UA: 0.2 mg/dL (ref 0.0–1.0)
pH: 5.5 (ref 5.0–8.0)

## 2014-08-18 NOTE — Progress Notes (Signed)
Primary - dr. Larose Kells No cardiologist Stress test/ekg few years ago - preventative

## 2014-08-22 MED ORDER — CEFAZOLIN SODIUM-DEXTROSE 2-3 GM-% IV SOLR
2.0000 g | INTRAVENOUS | Status: AC
  Administered 2014-08-23: 2 g via INTRAVENOUS
  Filled 2014-08-22: qty 50

## 2014-08-22 NOTE — Anesthesia Preprocedure Evaluation (Addendum)
Anesthesia Evaluation  Patient identified by MRN, date of birth, ID band Patient awake    Reviewed: Allergy & Precautions, NPO status   Airway Mallampati: II   Neck ROM: Full    Dental  (+) Teeth Intact   Pulmonary sleep apnea , COPD COPD inhaler, former smoker,  breath sounds clear to auscultation        Cardiovascular Rhythm:Regular  Normal stress test 2013   Neuro/Psych Anxiety Depression    GI/Hepatic GERD-  Controlled,  Endo/Other    Renal/GU      Musculoskeletal   Abdominal (+)  Abdomen: soft.    Peds  Hematology   Anesthesia Other Findings   Reproductive/Obstetrics                            Anesthesia Physical Anesthesia Plan  ASA: III  Anesthesia Plan: General   Post-op Pain Management:    Induction: Intravenous  Airway Management Planned: Oral ETT  Additional Equipment:   Intra-op Plan:   Post-operative Plan: Extubation in OR  Informed Consent: I have reviewed the patients History and Physical, chart, labs and discussed the procedure including the risks, benefits and alternatives for the proposed anesthesia with the patient or authorized representative who has indicated his/her understanding and acceptance.     Plan Discussed with:   Anesthesia Plan Comments:         Anesthesia Quick Evaluation

## 2014-08-23 ENCOUNTER — Encounter (HOSPITAL_COMMUNITY): Payer: BC Managed Care – PPO | Admitting: Anesthesiology

## 2014-08-23 ENCOUNTER — Ambulatory Visit (HOSPITAL_COMMUNITY): Payer: BC Managed Care – PPO

## 2014-08-23 ENCOUNTER — Observation Stay (HOSPITAL_COMMUNITY)
Admission: RE | Admit: 2014-08-23 | Discharge: 2014-08-24 | Disposition: A | Discharge status: Home / Self Care | Payer: BC Managed Care – PPO | Source: Ambulatory Visit | Attending: Orthopaedic Surgery | Admitting: Orthopaedic Surgery

## 2014-08-23 ENCOUNTER — Encounter (HOSPITAL_COMMUNITY)
Admission: RE | Disposition: A | Discharge status: Home / Self Care | Payer: Self-pay | Source: Ambulatory Visit | Attending: Orthopaedic Surgery

## 2014-08-23 ENCOUNTER — Ambulatory Visit (HOSPITAL_COMMUNITY): Payer: BC Managed Care – PPO | Admitting: Anesthesiology

## 2014-08-23 ENCOUNTER — Encounter (HOSPITAL_COMMUNITY): Payer: Self-pay | Admitting: Critical Care Medicine

## 2014-08-23 DIAGNOSIS — J449 Chronic obstructive pulmonary disease, unspecified: Secondary | ICD-10-CM | POA: Diagnosis not present

## 2014-08-23 DIAGNOSIS — M47812 Spondylosis without myelopathy or radiculopathy, cervical region: Principal | ICD-10-CM | POA: Insufficient documentation

## 2014-08-23 DIAGNOSIS — Z888 Allergy status to other drugs, medicaments and biological substances status: Secondary | ICD-10-CM | POA: Insufficient documentation

## 2014-08-23 DIAGNOSIS — Z882 Allergy status to sulfonamides status: Secondary | ICD-10-CM | POA: Diagnosis not present

## 2014-08-23 DIAGNOSIS — F329 Major depressive disorder, single episode, unspecified: Secondary | ICD-10-CM | POA: Diagnosis not present

## 2014-08-23 DIAGNOSIS — E739 Lactose intolerance, unspecified: Secondary | ICD-10-CM | POA: Diagnosis not present

## 2014-08-23 DIAGNOSIS — Z87891 Personal history of nicotine dependence: Secondary | ICD-10-CM | POA: Insufficient documentation

## 2014-08-23 DIAGNOSIS — J309 Allergic rhinitis, unspecified: Secondary | ICD-10-CM | POA: Insufficient documentation

## 2014-08-23 DIAGNOSIS — M4802 Spinal stenosis, cervical region: Secondary | ICD-10-CM | POA: Insufficient documentation

## 2014-08-23 DIAGNOSIS — K219 Gastro-esophageal reflux disease without esophagitis: Secondary | ICD-10-CM | POA: Diagnosis not present

## 2014-08-23 DIAGNOSIS — E785 Hyperlipidemia, unspecified: Secondary | ICD-10-CM | POA: Diagnosis not present

## 2014-08-23 DIAGNOSIS — M5020 Other cervical disc displacement, unspecified cervical region: Secondary | ICD-10-CM | POA: Diagnosis present

## 2014-08-23 DIAGNOSIS — G4733 Obstructive sleep apnea (adult) (pediatric): Secondary | ICD-10-CM | POA: Diagnosis not present

## 2014-08-23 DIAGNOSIS — Z419 Encounter for procedure for purposes other than remedying health state, unspecified: Secondary | ICD-10-CM

## 2014-08-23 HISTORY — PX: ANTERIOR CERVICAL DECOMP/DISCECTOMY FUSION: SHX1161

## 2014-08-23 SURGERY — ANTERIOR CERVICAL DECOMPRESSION/DISCECTOMY FUSION 1 LEVEL
Anesthesia: General

## 2014-08-23 MED ORDER — MORPHINE SULFATE 2 MG/ML IJ SOLN: 1.0000 mg | INTRAMUSCULAR | Status: DC | PRN

## 2014-08-23 MED ORDER — FLUTICASONE PROPIONATE 50 MCG/ACT NA SUSP
2.0000 | Freq: Every day | NASAL | Status: DC
  Administered 2014-08-24: 2 via NASAL
  Filled 2014-08-23: qty 16

## 2014-08-23 MED ORDER — ACETAMINOPHEN 325 MG PO TABS
650.0000 mg | ORAL_TABLET | ORAL | Status: DC | PRN
Start: 2014-08-23 — End: 2014-08-24

## 2014-08-23 MED ORDER — ROCURONIUM BROMIDE 50 MG/5ML IV SOLN
INTRAVENOUS | Status: AC
  Filled 2014-08-23: qty 1

## 2014-08-23 MED ORDER — FENTANYL CITRATE 0.05 MG/ML IJ SOLN
INTRAMUSCULAR | Status: DC | PRN
  Administered 2014-08-23 (×2): 100 ug via INTRAVENOUS
  Administered 2014-08-23 (×2): 50 ug via INTRAVENOUS

## 2014-08-23 MED ORDER — DOCUSATE SODIUM 100 MG PO CAPS
100.0000 mg | ORAL_CAPSULE | Freq: Two times a day (BID) | ORAL | Status: DC
  Administered 2014-08-23 – 2014-08-24 (×2): 100 mg via ORAL
  Filled 2014-08-23 (×2): qty 1

## 2014-08-23 MED ORDER — METHOCARBAMOL 500 MG PO TABS
500.0000 mg | ORAL_TABLET | Freq: Four times a day (QID) | ORAL | Status: DC | PRN
  Filled 2014-08-23: qty 1

## 2014-08-23 MED ORDER — KETOROLAC TROMETHAMINE 30 MG/ML IJ SOLN
30.0000 mg | Freq: Four times a day (QID) | INTRAMUSCULAR | Status: DC
  Administered 2014-08-23 – 2014-08-24 (×2): 30 mg via INTRAVENOUS
  Filled 2014-08-23 (×3): qty 1

## 2014-08-23 MED ORDER — GLYCOPYRROLATE 0.2 MG/ML IJ SOLN
INTRAMUSCULAR | Status: DC | PRN
  Administered 2014-08-23: 0.6 mg via INTRAVENOUS

## 2014-08-23 MED ORDER — FAMOTIDINE 20 MG PO TABS
20.0000 mg | ORAL_TABLET | Freq: Every day | ORAL | Status: DC
  Administered 2014-08-24: 20 mg via ORAL
  Filled 2014-08-23 (×2): qty 1

## 2014-08-23 MED ORDER — HYDROCODONE-ACETAMINOPHEN 5-325 MG PO TABS
1.0000 | ORAL_TABLET | ORAL | Status: DC | PRN
  Administered 2014-08-23: 2 via ORAL
  Filled 2014-08-23: qty 2

## 2014-08-23 MED ORDER — FLEET ENEMA 7-19 GM/118ML RE ENEM: 1.0000 | ENEMA | Freq: Once | RECTAL | Status: AC | PRN

## 2014-08-23 MED ORDER — MEPERIDINE HCL 25 MG/ML IJ SOLN: 6.2500 mg | INTRAMUSCULAR | Status: DC | PRN

## 2014-08-23 MED ORDER — BUPIVACAINE-EPINEPHRINE 0.5% -1:200000 IJ SOLN
INTRAMUSCULAR | Status: DC | PRN
  Administered 2014-08-23: 6 mL

## 2014-08-23 MED ORDER — ONDANSETRON HCL 4 MG/2ML IJ SOLN
4.0000 mg | INTRAMUSCULAR | Status: DC | PRN
  Administered 2014-08-24: 4 mg via INTRAVENOUS
  Filled 2014-08-23: qty 2

## 2014-08-23 MED ORDER — ALBUTEROL SULFATE (2.5 MG/3ML) 0.083% IN NEBU
3.0000 mL | INHALATION_SOLUTION | Freq: Every day | RESPIRATORY_TRACT | Status: DC | PRN
  Administered 2014-08-24: 3 mL via RESPIRATORY_TRACT
  Filled 2014-08-23: qty 3

## 2014-08-23 MED ORDER — ONDANSETRON HCL 4 MG/2ML IJ SOLN
INTRAMUSCULAR | Status: AC
  Filled 2014-08-23: qty 2

## 2014-08-23 MED ORDER — NEOSTIGMINE METHYLSULFATE 10 MG/10ML IV SOLN
INTRAVENOUS | Status: DC | PRN
  Administered 2014-08-23: 4 mg via INTRAVENOUS

## 2014-08-23 MED ORDER — MIDAZOLAM HCL 5 MG/5ML IJ SOLN
INTRAMUSCULAR | Status: DC | PRN
  Administered 2014-08-23: 2 mg via INTRAVENOUS

## 2014-08-23 MED ORDER — METHOCARBAMOL 500 MG PO TABS: 500.0000 mg | ORAL_TABLET | Freq: Four times a day (QID) | ORAL | Status: DC | PRN

## 2014-08-23 MED ORDER — MIDAZOLAM HCL 2 MG/2ML IJ SOLN
INTRAMUSCULAR | Status: AC
  Filled 2014-08-23: qty 2

## 2014-08-23 MED ORDER — LACTATED RINGERS IV SOLN
INTRAVENOUS | Status: DC
  Administered 2014-08-23 (×2): via INTRAVENOUS

## 2014-08-23 MED ORDER — LORATADINE 10 MG PO TABS
10.0000 mg | ORAL_TABLET | Freq: Every day | ORAL | Status: DC
  Administered 2014-08-23: 10 mg via ORAL
  Filled 2014-08-23 (×2): qty 1

## 2014-08-23 MED ORDER — SUCCINYLCHOLINE CHLORIDE 20 MG/ML IJ SOLN
INTRAMUSCULAR | Status: DC | PRN
  Administered 2014-08-23: 140 mg via INTRAVENOUS

## 2014-08-23 MED ORDER — LIDOCAINE HCL (CARDIAC) 20 MG/ML IV SOLN
INTRAVENOUS | Status: DC | PRN
Start: 2014-08-23 — End: 2014-08-23
  Administered 2014-08-23: 100 mg via INTRATRACHEAL
  Administered 2014-08-23: 100 mg via INTRAVENOUS

## 2014-08-23 MED ORDER — EZETIMIBE 10 MG PO TABS
10.0000 mg | ORAL_TABLET | Freq: Every day | ORAL | Status: DC
  Administered 2014-08-23 – 2014-08-24 (×2): 10 mg via ORAL
  Filled 2014-08-23 (×2): qty 1

## 2014-08-23 MED ORDER — FLUOXETINE HCL 20 MG PO TABS
20.0000 mg | ORAL_TABLET | Freq: Every day | ORAL | Status: DC
  Administered 2014-08-24: 20 mg via ORAL
  Filled 2014-08-23: qty 1

## 2014-08-23 MED ORDER — ZOLPIDEM TARTRATE 5 MG PO TABS
5.0000 mg | ORAL_TABLET | Freq: Every evening | ORAL | Status: DC | PRN
Start: 2014-08-23 — End: 2014-08-24

## 2014-08-23 MED ORDER — POTASSIUM CHLORIDE IN NACL 20-0.45 MEQ/L-% IV SOLN
INTRAVENOUS | Status: DC
  Administered 2014-08-23: 22:00:00 via INTRAVENOUS
  Filled 2014-08-23 (×3): qty 1000

## 2014-08-23 MED ORDER — NEOSTIGMINE METHYLSULFATE 10 MG/10ML IV SOLN
INTRAVENOUS | Status: AC
  Filled 2014-08-23: qty 1

## 2014-08-23 MED ORDER — CEFAZOLIN SODIUM 1-5 GM-% IV SOLN
1.0000 g | Freq: Once | INTRAVENOUS | Status: AC
  Administered 2014-08-23: 1 g via INTRAVENOUS
  Filled 2014-08-23: qty 50

## 2014-08-23 MED ORDER — ONDANSETRON HCL 4 MG/2ML IJ SOLN
INTRAMUSCULAR | Status: DC | PRN
  Administered 2014-08-23: 4 mg via INTRAVENOUS

## 2014-08-23 MED ORDER — BUPIVACAINE-EPINEPHRINE (PF) 0.5% -1:200000 IJ SOLN
INTRAMUSCULAR | Status: AC
  Filled 2014-08-23: qty 30

## 2014-08-23 MED ORDER — FENTANYL CITRATE 0.05 MG/ML IJ SOLN
INTRAMUSCULAR | Status: AC
  Filled 2014-08-23: qty 5

## 2014-08-23 MED ORDER — ROCURONIUM BROMIDE 100 MG/10ML IV SOLN
INTRAVENOUS | Status: DC | PRN
  Administered 2014-08-23: 30 mg via INTRAVENOUS

## 2014-08-23 MED ORDER — MENTHOL 3 MG MT LOZG
1.0000 | LOZENGE | OROMUCOSAL | Status: DC | PRN
  Filled 2014-08-23: qty 9

## 2014-08-23 MED ORDER — SODIUM CHLORIDE 0.9 % IJ SOLN: 3.0000 mL | INTRAMUSCULAR | Status: DC | PRN

## 2014-08-23 MED ORDER — PROPOFOL 10 MG/ML IV BOLUS
INTRAVENOUS | Status: DC | PRN
  Administered 2014-08-23: 160 mg via INTRAVENOUS

## 2014-08-23 MED ORDER — LIDOCAINE HCL (CARDIAC) 20 MG/ML IV SOLN
INTRAVENOUS | Status: AC
  Filled 2014-08-23: qty 10

## 2014-08-23 MED ORDER — DEXAMETHASONE SODIUM PHOSPHATE 4 MG/ML IJ SOLN
INTRAMUSCULAR | Status: DC | PRN
  Administered 2014-08-23: 4 mg via INTRAVENOUS

## 2014-08-23 MED ORDER — OXYCODONE-ACETAMINOPHEN 5-325 MG PO TABS
1.0000 | ORAL_TABLET | ORAL | Status: DC | PRN
  Administered 2014-08-23: 2 via ORAL
  Filled 2014-08-23: qty 2

## 2014-08-23 MED ORDER — GLYCOPYRROLATE 0.2 MG/ML IJ SOLN
INTRAMUSCULAR | Status: AC
  Filled 2014-08-23: qty 3

## 2014-08-23 MED ORDER — PHENOL 1.4 % MT LIQD
1.0000 | OROMUCOSAL | Status: DC | PRN
  Administered 2014-08-23: 1 via OROMUCOSAL
  Filled 2014-08-23: qty 177

## 2014-08-23 MED ORDER — SENNOSIDES-DOCUSATE SODIUM 8.6-50 MG PO TABS: 1.0000 | ORAL_TABLET | Freq: Every evening | ORAL | Status: DC | PRN

## 2014-08-23 MED ORDER — ACETAMINOPHEN 325 MG PO TABS
650.0000 mg | ORAL_TABLET | Freq: Once | ORAL | Status: AC
  Administered 2014-08-23: 650 mg via ORAL

## 2014-08-23 MED ORDER — ACETAMINOPHEN 650 MG RE SUPP: 650.0000 mg | RECTAL | Status: DC | PRN

## 2014-08-23 MED ORDER — BISACODYL 10 MG RE SUPP: 10.0000 mg | Freq: Every day | RECTAL | Status: DC | PRN

## 2014-08-23 MED ORDER — FENTANYL CITRATE 0.05 MG/ML IJ SOLN: 25.0000 ug | INTRAMUSCULAR | Status: DC | PRN

## 2014-08-23 MED ORDER — SODIUM CHLORIDE 0.9 % IV SOLN: 250.0000 mL | INTRAVENOUS | Status: DC

## 2014-08-23 MED ORDER — EPHEDRINE SULFATE 50 MG/ML IJ SOLN
INTRAMUSCULAR | Status: DC | PRN
  Administered 2014-08-23: 5 mg via INTRAVENOUS
  Administered 2014-08-23: 10 mg via INTRAVENOUS
  Administered 2014-08-23: 5 mg via INTRAVENOUS
  Administered 2014-08-23: 10 mg via INTRAVENOUS

## 2014-08-23 MED ORDER — ACETAMINOPHEN 325 MG PO TABS
ORAL_TABLET | ORAL | Status: AC
  Filled 2014-08-23: qty 2

## 2014-08-23 MED ORDER — PROMETHAZINE HCL 25 MG/ML IJ SOLN: 6.2500 mg | INTRAMUSCULAR | Status: DC | PRN

## 2014-08-23 MED ORDER — FENTANYL CITRATE 0.05 MG/ML IJ SOLN
INTRAMUSCULAR | Status: AC
Start: 2014-08-23 — End: 2014-08-23
  Filled 2014-08-23: qty 5

## 2014-08-23 MED ORDER — SODIUM CHLORIDE 0.9 % IJ SOLN: 3.0000 mL | Freq: Two times a day (BID) | INTRAMUSCULAR | Status: DC

## 2014-08-23 MED ORDER — METHOCARBAMOL 1000 MG/10ML IJ SOLN
500.0000 mg | Freq: Four times a day (QID) | INTRAVENOUS | Status: DC | PRN
  Filled 2014-08-23: qty 5

## 2014-08-23 MED ORDER — OXYCODONE-ACETAMINOPHEN 5-325 MG PO TABS: 1.0000 | ORAL_TABLET | ORAL | Status: DC | PRN

## 2014-08-23 MED ORDER — 0.9 % SODIUM CHLORIDE (POUR BTL) OPTIME
TOPICAL | Status: DC | PRN
  Administered 2014-08-23: 1000 mL

## 2014-08-23 SURGICAL SUPPLY — 59 items
ADH SKN CLS APL DERMABOND .7 (GAUZE/BANDAGES/DRESSINGS) ×1
BENZOIN TINCTURE PRP APPL 2/3 (GAUZE/BANDAGES/DRESSINGS) ×2 IMPLANT
BIT DRILL SKYLINE 12MM (BIT) ×1 IMPLANT
BLADE SURG ROTATE 9660 (MISCELLANEOUS) IMPLANT
BONE CERV LORDOTIC 14.5X12X6 (Bone Implant) ×2 IMPLANT
BUR ROUND FLUTED 4 SOFT TCH (BURR) IMPLANT
CLSR STERI-STRIP ANTIMIC 1/2X4 (GAUZE/BANDAGES/DRESSINGS) ×2 IMPLANT
COLLAR CERV LO CONTOUR FIRM DE (SOFTGOODS) ×2 IMPLANT
CORDS BIPOLAR (ELECTRODE) IMPLANT
COVER MAYO STAND STRL (DRAPES) ×2 IMPLANT
COVER SURGICAL LIGHT HANDLE (MISCELLANEOUS) ×2 IMPLANT
CRADLE DONUT ADULT HEAD (MISCELLANEOUS) ×2 IMPLANT
DERMABOND ADVANCED (GAUZE/BANDAGES/DRESSINGS) ×1
DERMABOND ADVANCED .7 DNX12 (GAUZE/BANDAGES/DRESSINGS) ×1 IMPLANT
DRAPE C-ARM 42X72 X-RAY (DRAPES) ×2 IMPLANT
DRAPE MICROSCOPE LEICA (MISCELLANEOUS) ×2 IMPLANT
DRAPE PROXIMA HALF (DRAPES) ×2 IMPLANT
DRILL BIT SKYLINE 12MM (BIT) ×2
DRSG MEPILEX BORDER 4X4 (GAUZE/BANDAGES/DRESSINGS) ×2 IMPLANT
DRSG MEPILEX BORDER 4X8 (GAUZE/BANDAGES/DRESSINGS) ×2 IMPLANT
DURAPREP 6ML APPLICATOR 50/CS (WOUND CARE) ×2 IMPLANT
ELECT COATED BLADE 2.86 ST (ELECTRODE) ×2 IMPLANT
ELECT REM PT RETURN 9FT ADLT (ELECTROSURGICAL) ×2
ELECTRODE REM PT RTRN 9FT ADLT (ELECTROSURGICAL) ×1 IMPLANT
EVACUATOR 1/8 PVC DRAIN (DRAIN) ×2 IMPLANT
GAUZE SPONGE 4X4 12PLY STRL (GAUZE/BANDAGES/DRESSINGS) ×2 IMPLANT
GAUZE XEROFORM 1X8 LF (GAUZE/BANDAGES/DRESSINGS) ×4 IMPLANT
GLOVE BIOGEL PI IND STRL 7.5 (GLOVE) ×1 IMPLANT
GLOVE BIOGEL PI IND STRL 8 (GLOVE) ×1 IMPLANT
GLOVE BIOGEL PI INDICATOR 7.5 (GLOVE) ×1
GLOVE BIOGEL PI INDICATOR 8 (GLOVE) ×1
GLOVE ECLIPSE 7.0 STRL STRAW (GLOVE) ×2 IMPLANT
GLOVE ORTHO TXT STRL SZ7.5 (GLOVE) ×2 IMPLANT
GOWN STRL REUS W/ TWL LRG LVL3 (GOWN DISPOSABLE) ×2 IMPLANT
GOWN STRL REUS W/ TWL XL LVL3 (GOWN DISPOSABLE) ×1 IMPLANT
GOWN STRL REUS W/TWL LRG LVL3 (GOWN DISPOSABLE) ×4
GOWN STRL REUS W/TWL XL LVL3 (GOWN DISPOSABLE) ×2
HEAD HALTER (SOFTGOODS) ×2 IMPLANT
HEMOSTAT SURGICEL 2X14 (HEMOSTASIS) IMPLANT
KIT BASIN OR (CUSTOM PROCEDURE TRAY) ×2 IMPLANT
KIT ROOM TURNOVER OR (KITS) ×2 IMPLANT
MANIFOLD NEPTUNE II (INSTRUMENTS) ×2 IMPLANT
NEEDLE 25GX 5/8IN NON SAFETY (NEEDLE) ×2 IMPLANT
NS IRRIG 1000ML POUR BTL (IV SOLUTION) ×2 IMPLANT
PACK ORTHO CERVICAL (CUSTOM PROCEDURE TRAY) ×2 IMPLANT
PAD ARMBOARD 7.5X6 YLW CONV (MISCELLANEOUS) ×4 IMPLANT
PATTIES SURGICAL .5 X.5 (GAUZE/BANDAGES/DRESSINGS) IMPLANT
PIN TEMP SKYLINE THREADED (PIN) ×2 IMPLANT
PLATE ONE LEVEL SKYLINE 14MM (Plate) ×2 IMPLANT
SCREW VARIABLE SELF TAP 12MM (Screw) ×8 IMPLANT
SURGIFLO TRUKIT (HEMOSTASIS) IMPLANT
SUT BONE WAX W31G (SUTURE) ×2 IMPLANT
SUT VIC AB 3-0 X1 27 (SUTURE) ×2 IMPLANT
SUT VICRYL 4-0 PS2 18IN ABS (SUTURE) ×4 IMPLANT
SYR 30ML SLIP (SYRINGE) ×2 IMPLANT
SYR BULB 3OZ (MISCELLANEOUS) ×2 IMPLANT
TOWEL OR 17X24 6PK STRL BLUE (TOWEL DISPOSABLE) ×2 IMPLANT
TOWEL OR 17X26 10 PK STRL BLUE (TOWEL DISPOSABLE) ×2 IMPLANT
WATER STERILE IRR 1000ML POUR (IV SOLUTION) ×2 IMPLANT

## 2014-08-23 NOTE — Plan of Care (Signed)
Problem: Consults Goal: Diagnosis - Spinal Surgery Outcome: Completed/Met Date Met:  08/23/14 Cervical Spine Fusion c6-c7

## 2014-08-23 NOTE — Discharge Instructions (Signed)
No lifting greater than 10 lbs. No overhead use of arms. Avoid bending,and twisting neck. Walk in house for first week them may start to get out slowly increasing distance up to one mile by 3 weeks post op. Keep incision dry for 3 days, may then bathe and wet incision using a covered collar when showering. Change dressing daily or as needed. Call if any fevers >101, chills, or increasing numbness or weakness or increased swelling or drainage.

## 2014-08-23 NOTE — Interval H&P Note (Signed)
History and Physical Interval Note:  08/23/2014 12:32 PM  Sheila Fuller  has presented today for surgery, with the diagnosis of C5-6 Spondylosis, Cervical Stenosis  M48.02, M47.812  The various methods of treatment have been discussed with the patient and family. After consideration of risks, benefits and other options for treatment, the patient has consented to  Procedure(s): C5-6 Anterior Cervical Discectomy and Fusion, Allograft, Plate (N/A) as a surgical intervention .  The patient's history has been reviewed, patient examined, no change in status, stable for surgery.  I have reviewed the patient's chart and labs.  Questions were answered to the patient's satisfaction.     Mikhayla Phillis C

## 2014-08-23 NOTE — Transfer of Care (Signed)
Immediate Anesthesia Transfer of Care Note  Patient: Sheila Fuller  Procedure(s) Performed: Procedure(s): C5-6 Anterior Cervical Discectomy and Fusion, Allograft, Plate (N/A)  Patient Location: PACU  Anesthesia Type:General  Level of Consciousness: awake, alert  and oriented  Airway & Oxygen Therapy: Patient Spontanous Breathing and Patient connected to nasal cannula oxygen  Post-op Assessment: Report given to PACU RN and Post -op Vital signs reviewed and stable  Post vital signs: Reviewed and stable  Complications: No apparent anesthesia complications

## 2014-08-23 NOTE — Interval H&P Note (Signed)
History and Physical Interval Note:  08/23/2014 12:32 PM  Sheila Fuller  has presented today for surgery, with the diagnosis of C5-6 Spondylosis, Cervical Stenosis  M48.02, M47.812  The various methods of treatment have been discussed with the patient and family. After consideration of risks, benefits and other options for treatment, the patient has consented to  Procedure(s): C5-6 Anterior Cervical Discectomy and Fusion, Allograft, Plate (N/A) as a surgical intervention .  The patient's history has been reviewed, patient examined, no change in status, stable for surgery.  I have reviewed the patient's chart and labs.  Questions were answered to the patient's satisfaction.     Adelise Buswell C

## 2014-08-23 NOTE — Anesthesia Procedure Notes (Addendum)
Performed by: Octavio Graves    Procedure Name: Intubation Date/Time: 08/23/2014 12:55 PM Performed by: Octavio Graves Pre-anesthesia Checklist: Patient identified, Timeout performed, Emergency Drugs available, Suction available and Patient being monitored Patient Re-evaluated:Patient Re-evaluated prior to inductionOxygen Delivery Method: Circle system utilized Preoxygenation: Pre-oxygenation with 100% oxygen Intubation Type: IV induction Ventilation: Mask ventilation without difficulty Grade View: Grade I Tube type: Oral Tube size: 7.5 mm Number of attempts: 1 Airway Equipment and Method: Stylet and Video-laryngoscopy Placement Confirmation: ETT inserted through vocal cords under direct vision,  breath sounds checked- equal and bilateral and positive ETCO2 Secured at: 22 cm Tube secured with: Tape Dental Injury: Teeth and Oropharynx as per pre-operative assessment  Comments: IV induction Manney- AM CRNA intubation atraumatic teeth and mouth as preop op

## 2014-08-23 NOTE — Brief Op Note (Cosign Needed)
08/23/2014  2:23 PM  PATIENT:  Sheila Fuller  57 y.o. female  PRE-OPERATIVE DIAGNOSIS:  C5-6 Spondylosis, Cervical Stenosis  M48.02, M47.812  POST-OPERATIVE DIAGNOSIS:  C5-6 Spondylosis, Cervical Stenosis   PROCEDURE:  Procedure(s): C5-6 Anterior Cervical Discectomy and Fusion, Allograft, Plate (N/A)  SURGEON:  Surgeon(s) and Role:    * Marybelle Killings, MD - Primary  PHYSICIAN ASSISTANT: Phillips Hay PAC  ASSISTANTS: none   ANESTHESIA:   general  EBL:  Total I/O In: 1200 [I.V.:1200] Out: 58 [Blood:50]  BLOOD ADMINISTERED:none  DRAINS: (1) Hemovact drain(s) in the anterior neck with  Suction Open   LOCAL MEDICATIONS USED:  MARCAINE     SPECIMEN:  No Specimen  DISPOSITION OF SPECIMEN:  N/A  COUNTS:  YES  TOURNIQUET:  * No tourniquets in log *  DICTATION: .Note written in EPIC  PLAN OF CARE: Admit for overnight observation  PATIENT DISPOSITION:  PACU - hemodynamically stable.   Delay start of Pharmacological VTE agent (>24hrs) due to surgical blood loss or risk of bleeding: yes

## 2014-08-23 NOTE — Anesthesia Postprocedure Evaluation (Signed)
  Anesthesia Post-op Note  Patient: Sheila Fuller  Procedure(s) Performed: Procedure(s): C5-6 Anterior Cervical Discectomy and Fusion, Allograft, Plate (N/A)  Patient Location: PACU  Anesthesia Type:General  Level of Consciousness: awake and alert   Airway and Oxygen Therapy: Patient Spontanous Breathing  Post-op Pain: mild  Post-op Assessment: Post-op Vital signs reviewed, Patient's Cardiovascular Status Stable, Respiratory Function Stable, Patent Airway and No signs of Nausea or vomiting  Post-op Vital Signs: Reviewed and stable  Last Vitals:  Filed Vitals:   08/23/14 1447  BP:   Pulse:   Temp: 36.7 C  Resp:     Complications: No apparent anesthesia complications

## 2014-08-24 DIAGNOSIS — M47812 Spondylosis without myelopathy or radiculopathy, cervical region: Secondary | ICD-10-CM | POA: Diagnosis not present

## 2014-08-24 LAB — GLUCOSE, CAPILLARY: Glucose-Capillary: 123 mg/dL — ABNORMAL HIGH (ref 70–99)

## 2014-08-24 NOTE — Progress Notes (Signed)
Patient provided with discharge instructions and follow up information. Additional soft collar provided for showering at home. She is going home at this time with husband.

## 2014-08-24 NOTE — Progress Notes (Signed)
Orthopedic Tech Progress Note Patient Details:  Sheila Fuller September 09, 1956 [id number] Delivered to patient for discharge. Patient to wrap collar in protective covering and wear as a shower collar. Re-apply dry collar after shower. Ortho Devices Type of Ortho Device: Soft collar Ortho Device/Splint Interventions: Ordered   [place] R Thompson 08/24/2014, 8:30 AM

## 2014-08-24 NOTE — Op Note (Signed)
[name], Sheila Fuller                ACCOUNT NO.:  [id number]  MEDICAL RECORD NO.:  [id number]  LOCATION:  5N24C                        FACILITY:  [facility]  PHYSICIAN:  Ragan Reale C. Lorin Mercy, M.D.    DATE OF BIRTH:  June 19, 1956  DATE OF PROCEDURE:  08/23/2014 DATE OF DISCHARGE:                              OPERATIVE REPORT   PREOPERATIVE DIAGNOSIS:  Cervical spondylosis with cervical stenosis, C5- C6.  POSTOPERATIVE DIAGNOSIS:  Cervical spondylosis with cervical stenosis, C5-C6.  PROCEDURE:  C5-6 anterior cervical diskectomy and fusion, allograft and plate.  SURGEON:  Imara Standiford C. Lorin Mercy, M.D.  ASSISTANT:  Epimenio Foot, PA-C, medically necessary and present for the entire procedure.  ESTIMATED BLOOD LOSS:  Minimal.  DRAINS:  1 Hemovac neck.  ANESTHESIA:  GOT plus Marcaine skin local.  IMPLANTS:  DePuy Synthes Skyline 14-mm plate, 12-mm screws x4, 6-mm lordotic cortical cancellous graft.  DESCRIPTION OF PROCEDURE:  After induction of general anesthesia, orotracheal intubation, preoperative Ancef prophylaxis, time-out procedure was completed.  DuraPrep was used.  Area was squared with towels, sterile skin marker on prominent skin fold just inferior to the planned level based on palpable landmarks, carotid tubercle and cricothyroid cartilage as well as clavicle.  Incision was started at the midline extending to the left, then with the skin marker and hash marks followed by Betadine Steri-Drape, sterile Mayo stand at the head, thyroid sheet and drape.  Incision was made.  Platysma was divided in line with the fibers starting at the midline, extending to the left, extending 3 cm.  Blunt dissection with carotid sheath and contents lateral, esophagus, trachea to the midline.  She had very prominent, thick and longus coli muscles with prominent large spurs at C5-6, confirmed with 25-gauge short needle, straight clamp and draped cross- table lateral C-arm.  C-arm was removed.  Spurs were  removed.  Self- retaining Cloward retractors were placed.  Progression down to the posterior longitudinal ligament was performed with spur removal.  She had touching osteophytes, these were initially curetted with a smallest Cloward curette.  Then, 1 and 2-mm Kerrison was used to remove spurs and then removal of disk fragments with micropituitary.  Continued decompression until the dura was completely decompressed.  Width was wide enough for 6-mm trial.  Endplates had been prepared with the bur and curette.  Decompression of uncovertebral joints, right and left. Head halter traction pulled by the CRNA.  Graft was countersunk 2 mm. Anterior aspect of the graft had been marked vertically in the midline and was exactly in the midline.  A 14-mm plate was selected.  Prong was inserted, checked under C-arm.  Holes were drilled.  Screws 12 mm were placed and the 14 mm plate, confirmed the lateral C-arm.  AP C-arm showing good position and alignment.  After irrigation with saline solution, Hemovac was placed with in-and-out technique in line with the skin incision, 3-0 platysma for closure of the platysma muscle, 4-0 Vicryl subcuticular closure, tincture of benzoin, Steri-Strips, Marcaine infiltration, 4x4s, tape, and soft collar.  Instrument count and needle count were correct.  The patient tolerated the procedure well, and transferred to the recovery room in stable condition.     Lus Kriegel C.  Lorin Mercy, M.D.     MCY/MEDQ  D:  08/23/2014  T:  08/24/2014  Job:  [id number]

## 2014-08-24 NOTE — Progress Notes (Signed)
Orthopedic Tech Progress Note Patient Details:  Sheila Fuller 1956-08-15 [id number]  Ortho Devices Type of Ortho Device: Soft collar Ortho Device/Splint Interventions: Application   Katheren Shams 08/24/2014, 1:13 AM

## 2014-08-24 NOTE — Progress Notes (Signed)
Subjective: 1 Day Post-Op Procedure(s) (LRB): C5-6 Anterior Cervical Discectomy and Fusion, Allograft, Plate (N/A) Patient reports pain as mild.  Had anxiety, asthma problems last night and drainage around HV which was charged but clotted off.   Objective: Vital signs in last 24 hours: Temp:  [96.8 F (36 C)-98.4 F (36.9 C)] 97.8 F (36.6 C) (10/29 0656) Pulse Rate:  [70-111] 103 (10/29 0656) Resp:  [7-20] 17 (10/29 0656) BP: (148-173)/(85-102) 160/93 mmHg (10/29 0656) SpO2:  [93 %-99 %] 98 % (10/29 0656) Weight:  [87.091 kg (192 lb)] 87.091 kg (192 lb) (10/28 1044)  Intake/Output from previous day: 10/28 0701 - 10/29 0700 In: 1320 [P.O.:120; I.V.:1200] Out: 9 [Drains:28; Blood:50] Intake/Output this shift:    No results found for this basename: HGB,  in the last 72 hours No results found for this basename: WBC, RBC, HCT, PLT,  in the last 72 hours No results found for this basename: NA, K, CL, CO2, BUN, CREATININE, GLUCOSE, CALCIUM,  in the last 72 hours No results found for this basename: LABPT, INR,  in the last 72 hours  Neurologically intact  Assessment/Plan: 1 Day Post-Op Procedure(s) (LRB): C5-6 Anterior Cervical Discectomy and Fusion, Allograft, Plate (N/A) Plan:   HV pulled steri strips and dressing changed. Extra collar for showers ( saran wrap over the collar).  Discharge home.   Shriley Joffe C 08/24/2014, 8:16 AM

## 2014-08-25 ENCOUNTER — Encounter (HOSPITAL_COMMUNITY): Payer: Self-pay | Admitting: Orthopaedic Surgery

## 2014-09-14 ENCOUNTER — Ambulatory Visit (INDEPENDENT_AMBULATORY_CARE_PROVIDER_SITE_OTHER): Payer: BC Managed Care – PPO | Admitting: Internal Medicine

## 2014-09-14 ENCOUNTER — Encounter: Payer: Self-pay | Admitting: Internal Medicine

## 2014-09-14 VITALS — BP 128/72 | HR 95 | Temp 98.0°F | Wt 190.1 lb

## 2014-09-14 DIAGNOSIS — M5020 Other cervical disc displacement, unspecified cervical region: Secondary | ICD-10-CM

## 2014-09-14 DIAGNOSIS — R350 Frequency of micturition: Secondary | ICD-10-CM

## 2014-09-14 DIAGNOSIS — F329 Major depressive disorder, single episode, unspecified: Secondary | ICD-10-CM

## 2014-09-14 DIAGNOSIS — F418 Other specified anxiety disorders: Secondary | ICD-10-CM

## 2014-09-14 DIAGNOSIS — F32A Depression, unspecified: Secondary | ICD-10-CM

## 2014-09-14 DIAGNOSIS — F419 Anxiety disorder, unspecified: Principal | ICD-10-CM

## 2014-09-14 DIAGNOSIS — N3001 Acute cystitis with hematuria: Secondary | ICD-10-CM

## 2014-09-14 LAB — URINALYSIS, ROUTINE W REFLEX MICROSCOPIC
Bilirubin Urine: NEGATIVE
KETONES UR: NEGATIVE
LEUKOCYTES UA: NEGATIVE
Nitrite: NEGATIVE
Specific Gravity, Urine: 1.02 (ref 1.000–1.030)
Total Protein, Urine: NEGATIVE
UROBILINOGEN UA: 0.2 (ref 0.0–1.0)
Urine Glucose: NEGATIVE
WBC, UA: NONE SEEN (ref 0–?)
pH: 6 (ref 5.0–8.0)

## 2014-09-14 LAB — POCT URINALYSIS DIPSTICK
Bilirubin, UA: NEGATIVE
Glucose, UA: NEGATIVE
Ketones, UA: NEGATIVE
LEUKOCYTES UA: NEGATIVE
NITRITE UA: NEGATIVE
PROTEIN UA: NEGATIVE
Spec Grav, UA: 1.025
Urobilinogen, UA: 0.2
pH, UA: 6

## 2014-09-14 MED ORDER — FLUOXETINE HCL 20 MG PO TABS: 20.0000 mg | ORAL_TABLET | Freq: Every day | ORAL | Status: DC

## 2014-09-14 MED ORDER — EZETIMIBE 10 MG PO TABS: 10.0000 mg | ORAL_TABLET | Freq: Every day | ORAL | Status: DC

## 2014-09-14 MED ORDER — CYCLOBENZAPRINE HCL 5 MG PO TABS: 5.0000 mg | ORAL_TABLET | Freq: Two times a day (BID) | ORAL | Status: DC | PRN

## 2014-09-14 NOTE — Assessment & Plan Note (Signed)
Urinary frequency last week, otherwise asymptomatic,UDIP + blood, check a urine culture, treat if appropriate.

## 2014-09-14 NOTE — Patient Instructions (Signed)
Please come back to the office in 3-4 months for a physical exam. Come back fasting    

## 2014-09-14 NOTE — Assessment & Plan Note (Addendum)
See previous entry, symptoms well-controlled with Prozac, refill provided

## 2014-09-14 NOTE — Assessment & Plan Note (Addendum)
Status post surgery 2 weeks ago, doing well Methocarbamol causes insomnia, prefers Flexeril Plan--  refill Flexeril

## 2014-09-14 NOTE — Progress Notes (Signed)
Subjective:    Patient ID: Sheila Fuller, female    DOB: 05/03/1956, 58 y.o.   MRN: [id number]  DOS:  09/14/2014 Type of visit - description : Routine visit Interval history: Anxiety, much improved with SSRIs, needs a refill COPD, still tobacco free and feeling well Had neck surgery 3 weeks ago so far good results, needs a prescription for Flexeril. UTI? Last week had more nocturia than usual, but back to baseline this week   ROS Denies fever chills No dysuria, hematuria or difficulty urinating  Past Medical History  Diagnosis Date  . Allergic rhinitis   . Depression   . Hyperlipidemia   . Carcinoma in situ of perianal skin     +XRT & chemo 2000; no further f/u w/ specialist.   . Microhematuria 01/2006    saw Dr.Peterson, renal u/s: r pyelocaliectasis but CT was neg, cysto neg  . Lactose intolerance 10/04/2013  . Asthma     seasonally  . Shortness of breath     only with asthma flare up  . Sleep apnea     rarely uses cpap  . Anxiety   . GERD (gastroesophageal reflux disease)     takes zantac maybe 2/week  . Arthritis   . Cancer     skin    Past Surgical History  Procedure Laterality Date  . Cervical cauterization    . Cesarean section    . Nasal septum surgery    . Ganglion cyst excision    . Anterior cervical decomp/discectomy fusion N/A 08/23/2014    Procedure: C5-6 Anterior Cervical Discectomy and Fusion, Allograft, Plate;  Surgeon: Marybelle Killings, MD;  Location: [facility];  Service: Orthopedics;  Laterality: N/A;    History   Social History  . Marital Status: Married    Spouse Name: N/A    Number of Children: 1  . 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 Smoking status: Former Smoker -- 0.30 packs/day for 3 years    Types: Cigarettes  . Smokeless tobacco: Never Used     Comment: quit 04-2013 ([facility])  . Alcohol Use: 1.5 oz/week    3 drink(s) per week  . Drug Use: No  .  Sexual Activity: Not on file   Other Topics Concern  . Not on file   Social History Narrative   remarried 10-11, 1 child        Medication List       This list is accurate as of: 09/14/14  3:01 PM.  Always use your most recent med list.               albuterol 108 (90 BASE) MCG/ACT inhaler  Commonly known as:  PROVENTIL HFA;VENTOLIN HFA  Inhale 2 puffs into the lungs daily as needed for shortness of breath. Inhale 2 puffs by mouth four times a day if needed. DUE for physical [phone] please schedule for additional refills.     cyclobenzaprine 5 MG tablet  Commonly known as:  FLEXERIL  Take 1 tablet (5 mg total) by mouth 2 (two) times daily as needed for muscle spasms.     desloratadine 5 MG tablet  Commonly known as:  CLARINEX  Take 5 mg by mouth daily.     ezetimibe 10 MG tablet  Commonly known as:  ZETIA  Take 1 tablet (10 mg total) by mouth daily.     FLUoxetine 20 MG  tablet  Commonly known as:  PROZAC  Take 1 tablet (20 mg total) by mouth daily.     fluticasone 50 MCG/ACT nasal spray  Commonly known as:  FLONASE  Place 2 sprays into both nostrils daily. instill 2 sprays into each nostril once daily     naproxen 500 MG tablet  Commonly known as:  NAPROSYN  Take 500 mg by mouth every morning.     oxyCODONE-acetaminophen 5-325 MG per tablet  Commonly known as:  ROXICET  Take 1-2 tablets by mouth every 4 (four) hours as needed for severe pain.     ranitidine 75 MG tablet  Commonly known as:  ZANTAC  Take 75 mg by mouth daily as needed.           Objective:   Physical Exam BP 128/72 mmHg  Pulse 95  Temp(Src) 98 F (36.7 C) (Oral)  Wt 190 lb 2 oz (86.24 kg)  SpO2 97% General -- alert, well-developed, NAD.   Abdomen-- Not distended, good bowel sounds,soft, non-tender. No  CVA tenderness Neurologic--  alert & oriented X3. Speech normal, gait appropriate for age, strength symmetric and appropriate for age.  Psych-- Cognition and judgment appear  intact. Cooperative with normal attention span and concentration. No anxious or depressed appearing.     Assessment & Plan:    High cholesterol, refill second, will check FLP when she comes back for a physical in few months

## 2014-09-14 NOTE — Progress Notes (Signed)
Pre visit review using our clinic review tool, if applicable. No additional management support is needed unless otherwise documented below in the visit note. 

## 2014-09-15 LAB — URINE CULTURE
COLONY COUNT: NO GROWTH
ORGANISM ID, BACTERIA: NO GROWTH

## 2014-09-18 NOTE — Discharge Summary (Signed)
Physician Discharge Summary  Patient ID: Sheila Fuller MRN: [id number] DOB/AGE: 57/28/1957 57 y.o.  Admit date: 08/23/2014 Discharge date: 08/24/2014  Admission Diagnoses:  HNP (herniated nucleus pulposus), cervical C5-6  Discharge Diagnoses:  Principal Problem:   HNP (herniated nucleus pulposus), cervical   Past Medical History  Diagnosis Date  . Allergic rhinitis   . Depression   . Hyperlipidemia   . Carcinoma in situ of perianal skin     +XRT & chemo 2000; no further f/u w/ specialist.   . Microhematuria 01/2006    saw Dr.Peterson, renal u/s: r pyelocaliectasis but CT was neg, cysto neg  . Lactose intolerance 10/04/2013  . Asthma     seasonally  . Shortness of breath     only with asthma flare up  . Sleep apnea     rarely uses cpap  . Anxiety   . GERD (gastroesophageal reflux disease)     takes zantac maybe 2/week  . Arthritis   . Cancer     skin    Surgeries: Procedure(s): C5-6 Anterior Cervical Discectomy and Fusion, Allograft, Plate on 33/00/7622   Consultants (if any):  none  Discharged Condition: Improved  Hospital Course: Sheila Fuller is an 58 y.o. female who was admitted 08/23/2014 with a diagnosis of HNP (herniated nucleus pulposus), cervical and went to the operating room on 08/23/2014 and underwent the above named procedures.    She was given perioperative antibiotics:      Anti-infectives    Start     Dose/Rate Route Frequency Ordered Stop   08/23/14 2000  ceFAZolin (ANCEF) IVPB 1 g/50 mL premix     1 g100 mL/hr over 30 Minutes Intravenous  Once 08/23/14 1723 08/23/14 2223   08/23/14 0600  ceFAZolin (ANCEF) IVPB 2 g/50 mL premix     2 g100 mL/hr over 30 Minutes Intravenous On call to O.R. 08/22/14 1417 08/23/14 1305    .  She was given sequential compression devices, early ambulation for DVT prophylaxis.  She benefited maximally from the hospital stay and there were no complications.    Recent vital signs:  Filed Vitals:   08/24/14 0656   BP: 160/93  Pulse: 103  Temp: 97.8 F (36.6 C)  Resp: 17    Recent laboratory studies:  Lab Results  Component Value Date   HGB 14.1 08/18/2014   HGB 14.0 01/14/2013   HGB 13.9 12/04/2011   Lab Results  Component Value Date   WBC 5.4 08/18/2014   PLT 230 08/18/2014   Lab Results  Component Value Date   INR 0.98 08/18/2014   Lab Results  Component Value Date   NA 141 08/18/2014   K 4.4 08/18/2014   CL 103 08/18/2014   CO2 25 08/18/2014   BUN 15 08/18/2014   CREATININE 0.77 08/18/2014   GLUCOSE 92 08/18/2014    Discharge Medications:     Medication List    STOP taking these medications        cyclobenzaprine 5 MG tablet  Commonly known as:  FLEXERIL     ezetimibe 10 MG tablet  Commonly known as:  ZETIA     FLUoxetine 20 MG tablet  Commonly known as:  PROZAC      TAKE these medications        albuterol 108 (90 BASE) MCG/ACT inhaler  Commonly known as:  PROVENTIL HFA;VENTOLIN HFA  Inhale 2 puffs into the lungs daily as needed for shortness of breath. Inhale 2 puffs by mouth four times a  day if needed. DUE for physical [phone] please schedule for additional refills.     desloratadine 5 MG tablet  Commonly known as:  CLARINEX  Take 5 mg by mouth daily.     fluticasone 50 MCG/ACT nasal spray  Commonly known as:  FLONASE  Place 2 sprays into both nostrils daily. instill 2 sprays into each nostril once daily     naproxen 500 MG tablet  Commonly known as:  NAPROSYN  Take 500 mg by mouth every morning.     oxyCODONE-acetaminophen 5-325 MG per tablet  Commonly known as:  ROXICET  Take 1-2 tablets by mouth every 4 (four) hours as needed for severe pain.     ranitidine 75 MG tablet  Commonly known as:  ZANTAC  Take 75 mg by mouth daily as needed.        Diagnostic Studies: Dg Cervical Spine 2-3 Views  08/23/2014   CLINICAL DATA:  ACDF C5-6.  EXAM: DG C-ARM 61-120 MIN; CERVICAL SPINE - 2-3 VIEW  FLUOROSCOPY TIME:  0 min 18 seconds.   COMPARISON:  MR cervical spine 08/04/2014.  FINDINGS: Intraoperative fluoroscopic spot views of the cervical spine are submitted in the AP and lateral projections. Patient is status post C5-6 anterior cervical fusion, relatively obscured by the shoulders on the lateral view.  IMPRESSION: Intraoperative visualization of C5-6 anterior cervical fusion.   Electronically Signed   By: Lorin Picket M.D.   On: 08/23/2014 15:13   Dg C-arm 1-60 Min  08/23/2014   CLINICAL DATA:  ACDF C5-6.  EXAM: DG C-ARM 61-120 MIN; CERVICAL SPINE - 2-3 VIEW  FLUOROSCOPY TIME:  0 min 18 seconds.  COMPARISON:  MR cervical spine 08/04/2014.  FINDINGS: Intraoperative fluoroscopic spot views of the cervical spine are submitted in the AP and lateral projections. Patient is status post C5-6 anterior cervical fusion, relatively obscured by the shoulders on the lateral view.  IMPRESSION: Intraoperative visualization of C5-6 anterior cervical fusion.   Electronically Signed   By: Lorin Picket M.D.   On: 08/23/2014 15:13    Disposition: 01-Home or Self Care   DISCHARGE INSTRUCTIONS: No lifting greater than 10 lbs. No overhead use of arms. Avoid bending,and twisting neck. Walk in house for first week them may start to get out slowly increasing distance up to one mile by 3 weeks post op. Keep incision dry for 3 days, may then bathe and wet incision using a covered collar when showering. Call if any fevers >101, chills, or increasing numbness or weakness or increased swelling or drainage.    Follow-up Information    Follow up with Marybelle Killings, MD. Schedule an appointment as soon as possible for a visit in 2 weeks.   Specialty:  Orthopedic Surgery   Contact information:   [place] [address] [phone]        Signed: Epimenio Foot 09/18/2014, 8:50 AM

## 2014-10-12 ENCOUNTER — Other Ambulatory Visit: Payer: Self-pay | Admitting: Internal Medicine

## 2014-10-12 ENCOUNTER — Other Ambulatory Visit: Payer: Self-pay

## 2015-01-03 ENCOUNTER — Encounter: Payer: Self-pay | Admitting: Medical

## 2015-01-03 ENCOUNTER — Ambulatory Visit (INDEPENDENT_AMBULATORY_CARE_PROVIDER_SITE_OTHER): Payer: BLUE CROSS/BLUE SHIELD | Admitting: Medical

## 2015-01-03 VITALS — BP 150/90 | HR 82 | Temp 97.8°F | Wt 191.2 lb

## 2015-01-03 DIAGNOSIS — I10 Essential (primary) hypertension: Secondary | ICD-10-CM

## 2015-01-03 MED ORDER — LISINOPRIL 5 MG PO TABS: 5.0000 mg | ORAL_TABLET | Freq: Every day | ORAL | Status: DC

## 2015-01-03 NOTE — Assessment & Plan Note (Addendum)
Dash diet, and exercise. Start low dose lisinopril. Follow up in one month. May need to increase dose if bp not adequately controlled.  Note my diastolic reading is not as high a her recent. I am starting with low dose med after discussion with her. She is aware that she may need higher dose.

## 2015-01-03 NOTE — Patient Instructions (Addendum)
Essential hypertension Dash diet, and exercise. Start low dose lisinopril. Follow up in one month. May need to increase dose if bp not adequately controlled.  Note my diastolic reading is not as high a her recent. I am starting with low dose med after discussion with her. She is aware that she may need higher dose.    DASH Eating Plan DASH stands for "Dietary Approaches to Stop Hypertension." The DASH eating plan is a healthy eating plan that has been shown to reduce high blood pressure (hypertension). Additional health benefits may include reducing the risk of type 2 diabetes mellitus, heart disease, and stroke. The DASH eating plan may also help with weight loss. WHAT DO I NEED TO KNOW ABOUT THE DASH EATING PLAN? For the DASH eating plan, you will follow these general guidelines:  Choose foods with a percent daily value for sodium of less than 5% (as listed on the food label).  Use salt-free seasonings or herbs instead of table salt or sea salt.  Check with your health care provider or pharmacist before using salt substitutes.  Eat lower-sodium products, often labeled as "lower sodium" or "no salt added."  Eat fresh foods.  Eat more vegetables, fruits, and low-fat dairy products.  Choose whole grains. Look for the word "whole" as the first word in the ingredient list.  Choose fish and skinless chicken or [place] more often than red meat. Limit fish, poultry, and meat to 6 oz (170 g) each day.  Limit sweets, desserts, sugars, and sugary drinks.  Choose heart-healthy fats.  Limit cheese to 1 oz (28 g) per day.  Eat more home-cooked food and less restaurant, buffet, and fast food.  Limit fried foods.  Cook foods using methods other than frying.  Limit canned vegetables. If you do use them, rinse them well to decrease the sodium.  When eating at a restaurant, ask that your food be prepared with less salt, or no salt if possible. WHAT FOODS CAN I EAT? Seek help from a  dietitian for individual calorie needs. Grains Whole grain or whole wheat bread. Brown rice. Whole grain or whole wheat pasta. Quinoa, bulgur, and whole grain cereals. Low-sodium cereals. Corn or whole wheat flour tortillas. Whole grain cornbread. Whole grain crackers. Low-sodium crackers. Vegetables Fresh or frozen vegetables (raw, steamed, roasted, or grilled). Low-sodium or reduced-sodium tomato and vegetable juices. Low-sodium or reduced-sodium tomato sauce and paste. Low-sodium or reduced-sodium canned vegetables.  Fruits All fresh, canned (in natural juice), or frozen fruits. Meat and Other Protein Products Ground beef (85% or leaner), grass-fed beef, or beef trimmed of fat. Skinless chicken or [place]. Ground chicken or [place]. Pork trimmed of fat. All fish and seafood. Eggs. Dried beans, peas, or lentils. Unsalted nuts and seeds. Unsalted canned beans. Dairy Low-fat dairy products, such as skim or 1% milk, 2% or reduced-fat cheeses, low-fat ricotta or cottage cheese, or plain low-fat yogurt. Low-sodium or reduced-sodium cheeses. Fats and Oils Tub margarines without trans fats. Light or reduced-fat mayonnaise and salad dressings (reduced sodium). Avocado. Safflower, olive, or canola oils. Natural peanut or almond butter. Other Unsalted popcorn and pretzels. The items listed above may not be a complete list of recommended foods or beverages. Contact your dietitian for more options. WHAT FOODS ARE NOT RECOMMENDED? Grains White bread. White pasta. White rice. Refined cornbread. Bagels and croissants. Crackers that contain trans fat. Vegetables Creamed or fried vegetables. Vegetables in a cheese sauce. Regular canned vegetables. Regular canned tomato sauce and paste. Regular tomato and vegetable juices.  Fruits Dried fruits. Canned fruit in light or heavy syrup. Fruit juice. Meat and Other Protein Products Fatty cuts of meat. Ribs, chicken wings, bacon, sausage, bologna, salami,  chitterlings, fatback, hot dogs, bratwurst, and packaged luncheon meats. Salted nuts and seeds. Canned beans with salt. Dairy Whole or 2% milk, cream, half-and-half, and cream cheese. Whole-fat or sweetened yogurt. Full-fat cheeses or blue cheese. Nondairy creamers and whipped toppings. Processed cheese, cheese spreads, or cheese curds. Condiments Onion and garlic salt, seasoned salt, table salt, and sea salt. Canned and packaged gravies. Worcestershire sauce. Tartar sauce. Barbecue sauce. Teriyaki sauce. Soy sauce, including reduced sodium. Steak sauce. Fish sauce. Oyster sauce. Cocktail sauce. Horseradish. Ketchup and mustard. Meat flavorings and tenderizers. Bouillon cubes. Hot sauce. Tabasco sauce. Marinades. Taco seasonings. Relishes. Fats and Oils Butter, stick margarine, lard, shortening, ghee, and bacon fat. Coconut, palm kernel, or palm oils. Regular salad dressings. Other Pickles and olives. Salted popcorn and pretzels. The items listed above may not be a complete list of foods and beverages to avoid. Contact your dietitian for more information. WHERE CAN I FIND MORE INFORMATION? National Heart, Lung, and Blood Institute: travelstabloid.com Document Released: 10/02/2011 Document Revised: 02/27/2014 Document Reviewed: 08/17/2013 [facility] Patient Information 2015 [place], [place]. This information is not intended to replace advice given to you by your health care provider. Make sure you discuss any questions you have with your health care provider.

## 2015-01-03 NOTE — Progress Notes (Signed)
Subjective:    Patient ID: Sheila Fuller, female    DOB: 08/12/1956, 59 y.o.   MRN: [id number]  HPI   Pt in with some recently. Pt states at dentist about 1 wk ago was 150/110. Then the other night at pharmacy 150/110. Then last night was 164/118.  Pt while in hospital day of surgery and day after surgery her bp was elevated.  Pt did have faint mild ha 4 week. But then states cleared up over last 2 wks.  No cardiac or neurologic symptoms now  2 cups coffee a day.  Does some yoga 1 day a week and walks dogs 3 days a week.  Pt does not smoke.        Review of Systems  Constitutional: Negative for fever, chills, diaphoresis, activity change and fatigue.  Respiratory: Negative for cough, chest tightness and shortness of breath.   Cardiovascular: Negative for chest pain, palpitations and leg swelling.  Gastrointestinal: Negative for nausea, vomiting and abdominal pain.  Musculoskeletal: Negative for neck pain and neck stiffness.  Neurological: Negative for dizziness, tremors, seizures, syncope, facial asymmetry, speech difficulty, weakness, light-headedness, numbness and headaches.  Psychiatric/Behavioral: Negative for behavioral problems, confusion and agitation. The patient is not nervous/anxious.      Past Medical History  Diagnosis Date  . Allergic rhinitis   . Depression   . Hyperlipidemia   . Carcinoma in situ of perianal skin     +XRT & chemo 2000; no further f/u w/ specialist.   . Microhematuria 01/2006    saw Dr.Peterson, renal u/s: r pyelocaliectasis but CT was neg, cysto neg  . Lactose intolerance 10/04/2013  . Asthma     seasonally  . Shortness of breath     only with asthma flare up  . Sleep apnea     rarely uses cpap  . Anxiety   . GERD (gastroesophageal reflux disease)     takes zantac maybe 2/week  . Arthritis   . Cancer     skin    History   Social History  . Marital Status: Married    Spouse Name: N/A  . Number of Children: 1  . 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 Smoking status: Former Smoker -- 0.30 packs/day for 3 years    Types: Cigarettes  . Smokeless tobacco: Never Used     Comment: quit 04-2013 (Chantix)  . Alcohol Use: 1.5 oz/week    3 drink(s) per week  . Drug Use: No  . Sexual Activity: Not on file   Other Topics Concern  . Not on file   Social History Narrative   remarried 10-11, 1 child    Past Surgical History  Procedure Laterality Date  . Cervical cauterization    . Cesarean section    . Nasal septum surgery    . Ganglion cyst excision    . Anterior cervical decomp/discectomy fusion N/A 08/23/2014    Procedure: C5-6 Anterior Cervical Discectomy and Fusion, Allograft, Plate;  Surgeon: Marybelle Killings, MD;  Location: [place];  Service: Orthopedics;  Laterality: N/A;    Family History  Problem Relation Age of Onset  . Breast cancer Neg Hx   . Coronary artery disease Neg Hx   . Hypertension Neg Hx   . Stroke Neg Hx   . Sudden death Neg Hx   . Heart attack Neg Hx   . Colon cancer  aunt  . Lung cancer Father     smoker  . Diabetes Mother     late onset  . Hyperlipidemia Mother   . Cervical cancer Sister     Allergies  Allergen Reactions  . Statins     REACTION: myalgia  . Sulfonamide Derivatives Rash    Current Outpatient Prescriptions on File Prior to Visit  Medication Sig Dispense Refill  . albuterol (PROVENTIL HFA;VENTOLIN HFA) 108 (90 BASE) MCG/ACT inhaler Inhale 2 puffs into the lungs daily as needed for shortness of breath. Inhale 2 puffs by mouth four times a day if needed. DUE for physical [phone] please schedule for additional refills.    . cyclobenzaprine (FLEXERIL) 5 MG tablet Take 1 tablet (5 mg total) by mouth 2 (two) times daily as needed for muscle spasms. 60 tablet 2  . ezetimibe (ZETIA) 10 MG tablet Take 1 tablet (10 mg total) by mouth daily. 90 tablet 2  . fluticasone  (FLONASE) 50 MCG/ACT nasal spray Place 2 sprays into both nostrils daily. instill 2 sprays into each nostril once daily    . naproxen (NAPROSYN) 500 MG tablet Take 500 mg by mouth every morning.    Marland Kitchen oxyCODONE-acetaminophen (ROXICET) 5-325 MG per tablet Take 1-2 tablets by mouth every 4 (four) hours as needed for severe pain. 60 tablet 0  . ranitidine (ZANTAC) 75 MG tablet Take 75 mg by mouth daily as needed.     . desloratadine (CLARINEX) 5 MG tablet Take 5 mg by mouth daily.     No current facility-administered medications on file prior to visit.    BP 148/98 mmHg  Pulse 82  Temp(Src) 97.8 F (36.6 C) (Oral)  Wt 191 lb 3.2 oz (86.728 kg)  SpO2 98%       Objective:   Physical Exam  General Mental Status- Alert. General Appearance- Not in acute distress.   Skin General: Color- Normal Color. Moisture- Normal Moisture.  Neck Carotid Arteries- Normal color. Moisture- Normal Moisture. No carotid bruits. No JVD.  Chest and Lung Exam Auscultation: Breath Sounds:-Normal.  Cardiovascular Auscultation:Rythm- Regular. Murmurs & Other Heart Sounds:Auscultation of the heart reveals- No Murmurs.  Abdomen Inspection:-Inspeection Normal. Palpation/Percussion:Note:No mass. Palpation and Percussion of the abdomen reveal- Non Tender, Non Distended + BS, no rebound or guarding.    Neurologic Cranial Nerve exam:- CN III-XII intact(No nystagmus), symmetric smile. Drift Test:- No drift. Romberg Exam:- Negative.  Heal to Toe Gait exam:-Normal. Finger to Nose:- Normal/Intact Strength:- 5/5 equal and symmetric strength both upper and lower extremities.      Assessment & Plan:

## 2015-01-04 LAB — CBC WITH DIFFERENTIAL/PLATELET
BASOS PCT: 0.5 % (ref 0.0–3.0)
Basophils Absolute: 0 10*3/uL (ref 0.0–0.1)
EOS ABS: 0.1 10*3/uL (ref 0.0–0.7)
Eosinophils Relative: 1.6 % (ref 0.0–5.0)
HCT: 41.4 % (ref 36.0–46.0)
HEMOGLOBIN: 14.4 g/dL (ref 12.0–15.0)
Lymphocytes Relative: 40.4 % (ref 12.0–46.0)
Lymphs Abs: 1.8 10*3/uL (ref 0.7–4.0)
MCHC: 34.7 g/dL (ref 30.0–36.0)
MCV: 85.9 fl (ref 78.0–100.0)
Monocytes Absolute: 0.3 10*3/uL (ref 0.1–1.0)
Monocytes Relative: 7 % (ref 3.0–12.0)
NEUTROS ABS: 2.3 10*3/uL (ref 1.4–7.7)
NEUTROS PCT: 50.5 % (ref 43.0–77.0)
Platelets: 243 10*3/uL (ref 150.0–400.0)
RBC: 4.82 Mil/uL (ref 3.87–5.11)
RDW: 13.3 % (ref 11.5–15.5)
WBC: 4.6 10*3/uL (ref 4.0–10.5)

## 2015-01-04 LAB — COMPREHENSIVE METABOLIC PANEL
ALT: 17 U/L (ref 0–35)
AST: 16 U/L (ref 0–37)
Albumin: 4.3 g/dL (ref 3.5–5.2)
Alkaline Phosphatase: 64 U/L (ref 39–117)
BUN: 13 mg/dL (ref 6–23)
CO2: 31 meq/L (ref 19–32)
CREATININE: 0.74 mg/dL (ref 0.40–1.20)
Calcium: 9.6 mg/dL (ref 8.4–10.5)
Chloride: 104 mEq/L (ref 96–112)
GFR: 85.68 mL/min (ref >60.00)
Glucose, Bld: 83 mg/dL (ref 70–99)
Potassium: 3.8 mEq/L (ref 3.5–5.1)
Sodium: 139 mEq/L (ref 135–145)
Total Bilirubin: 0.3 mg/dL (ref 0.2–1.2)
Total Protein: 7.1 g/dL (ref 6.0–8.3)

## 2015-01-30 ENCOUNTER — Other Ambulatory Visit: Payer: Self-pay | Admitting: Internal Medicine

## 2015-02-02 ENCOUNTER — Ambulatory Visit: Payer: BLUE CROSS/BLUE SHIELD | Admitting: Medical

## 2015-02-05 ENCOUNTER — Ambulatory Visit: Payer: BLUE CROSS/BLUE SHIELD | Admitting: Medical

## 2015-02-05 ENCOUNTER — Telehealth: Payer: Self-pay | Admitting: Medical

## 2015-02-05 NOTE — Telephone Encounter (Signed)
-----   Message from [facility] sent at 02/05/2015  1:02 PM EDT ----- Pt had appt today, pt states her car will not start and she has to call and wrecker, pt does not want to be charged a no show fee.

## 2015-02-07 ENCOUNTER — Ambulatory Visit (INDEPENDENT_AMBULATORY_CARE_PROVIDER_SITE_OTHER): Payer: BLUE CROSS/BLUE SHIELD | Admitting: Medical

## 2015-02-07 ENCOUNTER — Encounter: Payer: Self-pay | Admitting: Medical

## 2015-02-07 VITALS — BP 134/94 | HR 90 | Temp 98.1°F | Ht 69.5 in | Wt 190.1 lb

## 2015-02-07 DIAGNOSIS — I10 Essential (primary) hypertension: Secondary | ICD-10-CM

## 2015-02-07 MED ORDER — ALBUTEROL SULFATE HFA 108 (90 BASE) MCG/ACT IN AERS: INHALATION_SPRAY | RESPIRATORY_TRACT | Status: DC

## 2015-02-07 MED ORDER — LOSARTAN POTASSIUM 100 MG PO TABS: 100.0000 mg | ORAL_TABLET | Freq: Every day | ORAL | Status: DC

## 2015-02-07 NOTE — Patient Instructions (Signed)
Essential hypertension Pt bp is better. But unfortunatley cough with ace. Therefore will prescribe ARB.    Losartan rx 100 mg a day.  Give [place] a call in one month with bp reading to confirm on correct dose. Call sooner if bp reading to low, to high, or side effects. But don't anticipate any.  Rx of albuterol if wheezing during allergy season written today.  Follow up in 3 months or as needed.

## 2015-02-07 NOTE — Assessment & Plan Note (Signed)
Pt bp is better. But unfortunatley cough with ace. Therefore will prescribe ARB.

## 2015-02-07 NOTE — Progress Notes (Signed)
Subjective:    Patient ID: Sheila Fuller, female    DOB: 11/26/1955, 59 y.o.   MRN: [id number]  HPI  Pt in for follow up. She states with lisinopril of cough. Pt associated allergy symptoms so does appear to be side effect of medication. No chest pain and no neurologic signs or symptoms. Pt bp is running 130/90 range. (At home 130/high 80's to low 90's.   Review of Systems  Constitutional: Negative for fever, chills, diaphoresis, activity change and fatigue.  Respiratory: Positive for cough. Negative for chest tightness and shortness of breath.        Which pt states onset came on with med.  Cardiovascular: Negative for chest pain, palpitations and leg swelling.  Gastrointestinal: Negative for nausea, vomiting and abdominal pain.  Musculoskeletal: Negative for neck pain and neck stiffness.  Neurological: Negative for dizziness, tremors, seizures, syncope, facial asymmetry, speech difficulty, weakness, light-headedness, numbness and headaches.  Psychiatric/Behavioral: Negative for behavioral problems, confusion and agitation. The patient is not nervous/anxious.    Past Medical History  Diagnosis Date  . Allergic rhinitis   . Depression   . Hyperlipidemia   . Carcinoma in situ of perianal skin     +XRT & chemo 2000; no further f/u w/ specialist.   . Microhematuria 01/2006    saw Dr.Peterson, renal u/s: r pyelocaliectasis but CT was neg, cysto neg  . Lactose intolerance 10/04/2013  . Asthma     seasonally  . Shortness of breath     only with asthma flare up  . Sleep apnea     rarely uses cpap  . Anxiety   . GERD (gastroesophageal reflux disease)     takes zantac maybe 2/week  . Arthritis   . Cancer     skin    History   Social History  . Marital Status: Married    Spouse Name: N/A  . Number of Children: 1  . 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 Smoking status: Former Smoker -- 0.30  packs/day for 3 years    Types: Cigarettes  . Smokeless tobacco: Never Used     Comment: quit 04-2013 (Chantix)  . Alcohol Use: 1.5 oz/week    3 drink(s) per week  . Drug Use: No  . Sexual Activity: Not on file   Other Topics Concern  . Not on file   Social History Narrative   remarried 10-11, 1 child    Past Surgical History  Procedure Laterality Date  . Cervical cauterization    . Cesarean section    . Nasal septum surgery    . Ganglion cyst excision    . Anterior cervical decomp/discectomy fusion N/A 08/23/2014    Procedure: C5-6 Anterior Cervical Discectomy and Fusion, Allograft, Plate;  Surgeon: Marybelle Killings, MD;  Location: [place];  Service: Orthopedics;  Laterality: N/A;    Family History  Problem Relation Age of Onset  . Breast cancer Neg Hx   . Coronary artery disease Neg Hx   . Hypertension Neg Hx   . Stroke Neg Hx   . Sudden death Neg Hx   . Heart attack Neg Hx   . Colon cancer      aunt  . Lung cancer Father     smoker  . Diabetes Mother     late onset  . Hyperlipidemia Mother   . Cervical cancer Sister  Allergies  Allergen Reactions  . Statins     REACTION: myalgia  . Sulfonamide Derivatives Rash    Current Outpatient Prescriptions on File Prior to Visit  Medication Sig Dispense Refill  . albuterol (PROVENTIL HFA;VENTOLIN HFA) 108 (90 BASE) MCG/ACT inhaler Inhale 2 puffs into the lungs daily as needed for shortness of breath. Inhale 2 puffs by mouth four times a day if needed. DUE for physical [phone] please schedule for additional refills.    . cyclobenzaprine (FLEXERIL) 5 MG tablet Take 1 tablet (5 mg total) by mouth 2 (two) times daily as needed for muscle spasms. 60 tablet 2  . desloratadine (CLARINEX) 5 MG tablet Take 5 mg by mouth daily.    Marland Kitchen ezetimibe (ZETIA) 10 MG tablet Take 1 tablet (10 mg total) by mouth daily. 90 tablet 2  . FLUoxetine (PROZAC) 20 MG capsule   0  . fluticasone (FLONASE) 50 MCG/ACT nasal spray Place 2 sprays into  both nostrils daily. instill 2 sprays into each nostril once daily    . lisinopril (PRINIVIL,ZESTRIL) 5 MG tablet Take 1 tablet (5 mg total) by mouth daily. 30 tablet 1  . naproxen (NAPROSYN) 500 MG tablet Take 500 mg by mouth every morning.    Marland Kitchen oxyCODONE-acetaminophen (ROXICET) 5-325 MG per tablet Take 1-2 tablets by mouth every 4 (four) hours as needed for severe pain. 60 tablet 0  . ranitidine (ZANTAC) 75 MG tablet Take 75 mg by mouth daily as needed.      No current facility-administered medications on file prior to visit.    BP 134/94 mmHg  Pulse 90  Temp(Src) 98.1 F (36.7 C) (Oral)  Ht 5' 9.5" (1.765 m)  Wt 190 lb 1.6 oz (86.229 kg)  BMI 27.68 kg/m2  SpO2 95%      Objective:   Physical Exam  General Mental Status- Alert. General Appearance- Not in acute distress.   Skin General: Color- Normal Color. Moisture- Normal Moisture.    Chest and Lung Exam Auscultation: Breath Sounds:-Normal.  Cardiovascular Auscultation:Rythm- Regular. Murmurs & Other Heart Sounds:Auscultation of the heart reveals- No Murmurs.    Neurologic Cranial Nerve exam:- CN III-XII intact(No nystagmus), symmetric smile. Strength:- 5/5 equal and symmetric strength both upper and lower extremities.      Assessment & Plan:

## 2015-02-07 NOTE — Progress Notes (Signed)
Pre visit review using our clinic review tool, if applicable. No additional management support is needed unless otherwise documented below in the visit note. 

## 2015-02-22 ENCOUNTER — Other Ambulatory Visit: Payer: Self-pay

## 2015-03-28 ENCOUNTER — Telehealth: Payer: Self-pay | Admitting: Internal Medicine

## 2015-03-28 NOTE — Telephone Encounter (Signed)
Pre Visit letter sent  °

## 2015-04-12 ENCOUNTER — Other Ambulatory Visit: Payer: Self-pay | Admitting: Internal Medicine

## 2015-04-16 ENCOUNTER — Other Ambulatory Visit: Payer: Self-pay

## 2015-04-17 ENCOUNTER — Encounter: Payer: Self-pay | Admitting: Internal Medicine

## 2015-04-17 ENCOUNTER — Ambulatory Visit (INDEPENDENT_AMBULATORY_CARE_PROVIDER_SITE_OTHER): Payer: BLUE CROSS/BLUE SHIELD | Admitting: Internal Medicine

## 2015-04-17 VITALS — BP 130/78 | HR 74 | Temp 97.6°F | Ht 70.0 in | Wt 193.2 lb

## 2015-04-17 DIAGNOSIS — Z0000 Encounter for general adult medical examination without abnormal findings: Secondary | ICD-10-CM

## 2015-04-17 DIAGNOSIS — I10 Essential (primary) hypertension: Secondary | ICD-10-CM

## 2015-04-17 DIAGNOSIS — Z23 Encounter for immunization: Secondary | ICD-10-CM

## 2015-04-17 DIAGNOSIS — G4730 Sleep apnea, unspecified: Secondary | ICD-10-CM

## 2015-04-17 MED ORDER — LOSARTAN POTASSIUM 100 MG PO TABS: 100.0000 mg | ORAL_TABLET | Freq: Every day | ORAL | Status: DC

## 2015-04-17 MED ORDER — FLUTICASONE PROPIONATE 50 MCG/ACT NA SUSP: 2.0000 | Freq: Every day | NASAL | Status: DC

## 2015-04-17 MED ORDER — EZETIMIBE 10 MG PO TABS: 10.0000 mg | ORAL_TABLET | Freq: Every day | ORAL | Status: DC

## 2015-04-17 NOTE — Progress Notes (Signed)
Subjective:    Patient ID: Sheila Fuller, female    DOB: [birth year], 59 y.o.   MRN: [id number]  DOS:  04/17/2015 Type of visit - description : cpx Interval history: Good compliance of medication, ambulatory BPs very good.   Review of Systems Constitutional: No fever. No chills. No unexplained wt changes. No unusual sweats  HEENT: No dental problems, no ear discharge, no facial swelling, no voice changes. No eye discharge, no eye  redness , no  intolerance to light   Respiratory: No wheezing , no  difficulty breathing. No cough , no mucus production. Admits to fatigue, very sleepy in the afternoons, often times take a nap, not using CPAP lately  Cardiovascular: No CP, no leg swelling , no  Palpitations  GI: no nausea, no vomiting, no diarrhea , no  abdominal pain.  No blood in the stools. No dysphagia, no odynophagia    Endocrine: No polyphagia, no polyuria , no polydipsia  GU: No dysuria, gross hematuria, difficulty urinating. No urinary urgency, no frequency.  Musculoskeletal: No joint swellings or unusual aches or pains  Skin: No change in the color of the skin, palor , no  Rash  Allergic, immunologic: + environmental allergies , no  food allergies  Neurological: No dizziness no  syncope. No headaches. No diplopia, no slurred, no slurred speech, no motor deficits, no facial  Numbness  Hematological: No enlarged lymph nodes, no easy bruising , no unusual bleedings  Psychiatry: No suicidal ideas, no hallucinations, no beavior problems, no confusion.  No unusual/severe anxiety, no depression    Past Medical History  Diagnosis Date  . Allergic rhinitis   . Depression   . Hyperlipidemia   . Carcinoma in situ of perianal skin     +XRT & chemo 2000; no further f/u w/ specialist.   . Microhematuria 01/2006    saw Dr.Peterson, renal u/s: r pyelocaliectasis but CT was neg, cysto neg  . Lactose intolerance 10/04/2013  . Asthma     seasonally  . Shortness of breath    only with asthma flare up  . Sleep apnea     rarely uses cpap  . Anxiety   . GERD (gastroesophageal reflux disease)     takes zantac maybe 2/week  . Arthritis   . Cancer     skin    Past Surgical History  Procedure Laterality Date  . Cervical cauterization    . Cesarean section    . Nasal septum surgery    . Ganglion cyst excision    . Anterior cervical decomp/discectomy fusion N/A 08/23/2014    Procedure: C5-6 Anterior Cervical Discectomy and Fusion, Allograft, Plate;  Surgeon: Marybelle Killings, MD;  Location: [place];  Service: Orthopedics;  Laterality: N/A;    History   Social History  . Marital Status: Married    Spouse Name: N/A  . Number of Children: 1  . 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 Smoking status: Former Smoker -- 0.30 packs/day for 3 years    Types: Cigarettes  . Smokeless tobacco: Never Used     Comment: quit 04-2013 (Chantix)  . Alcohol Use: 1.5 oz/week    3 drink(s) per week  . Drug Use: No  . Sexual Activity: Not on file   Other Topics Concern  . Not on file   Social History Narrative   remarried 10-11, 1 child  Family History  Problem Relation Age of Onset  . Breast cancer Neg Hx   . Coronary artery disease Neg Hx   . Hypertension Neg Hx   . Stroke Neg Hx   . Sudden death Neg Hx   . Heart attack Neg Hx   . Colon cancer Other     aunt  . Lung cancer Father     smoker  . Diabetes Mother     late onset  . Hyperlipidemia Mother   . Cervical cancer Sister       Medication List       This list is accurate as of: 04/17/15 11:59 PM.  Always use your most recent med list.               albuterol 108 (90 BASE) MCG/ACT inhaler  Commonly known as:  PROVENTIL HFA;VENTOLIN HFA  Inhale 2 puffs by mouth four times a day if needed.     cyclobenzaprine 5 MG tablet  Commonly known as:  FLEXERIL  Take 1 tablet (5 mg total) by mouth 2 (two) times daily as  needed for muscle spasms.     desloratadine 5 MG tablet  Commonly known as:  CLARINEX  Take 1 tablet (5 mg total) by mouth daily.     ezetimibe 10 MG tablet  Commonly known as:  ZETIA  Take 1 tablet (10 mg total) by mouth daily.     FLUoxetine 20 MG capsule  Commonly known as:  PROZAC     fluticasone 50 MCG/ACT nasal spray  Commonly known as:  FLONASE  Place 2 sprays into both nostrils daily.     losartan 100 MG tablet  Commonly known as:  COZAAR  Take 1 tablet (100 mg total) by mouth daily.     naproxen 500 MG tablet  Commonly known as:  NAPROSYN  Take 500 mg by mouth every morning.     oxyCODONE-acetaminophen 5-325 MG per tablet  Commonly known as:  ROXICET  Take 1-2 tablets by mouth every 4 (four) hours as needed for severe pain.     ranitidine 75 MG tablet  Commonly known as:  ZANTAC  Take 75 mg by mouth daily as needed.           Objective:   Physical Exam BP 130/78 mmHg  Pulse 74  Temp(Src) 97.6 F (36.4 C) (Oral)  Ht 5\' 10"  (1.778 m)  Wt 193 lb 4 oz (87.658 kg)  BMI 27.73 kg/m2  SpO2 97%  General:   Well developed, well nourished . NAD.  Neck:  Full range of motion. Supple. No  thyromegaly , normal carotid pulse HEENT:  Normocephalic . Face symmetric, atraumatic Lungs:  CTA B Normal respiratory effort, no intercostal retractions, no accessory muscle use. Heart: RRR,  no murmur.  No pretibial edema bilaterally  Abdomen:  Not distended, soft, non-tender. No rebound or rigidity. No mass,organomegaly Skin: Exposed areas without rash. Not pale. Not jaundice Neurologic:  alert & oriented X3.  Speech normal, gait appropriate for age and unassisted Strength symmetric and appropriate for age.  Psych: Cognition and judgment appear intact.  Cooperative with normal attention span and concentration.  Behavior appropriate. No anxious or depressed appearing.       Assessment & Plan:   Allergies, not controlled 100% but she simply likes to continue  with Flonase and antihistaminic's

## 2015-04-17 NOTE — Progress Notes (Signed)
Pre visit review using our clinic review tool, if applicable. No additional management support is needed unless otherwise documented below in the visit note. 

## 2015-04-17 NOTE — Patient Instructions (Signed)
  Please schedule labs to be done within few days (fasting)   

## 2015-04-17 NOTE — Assessment & Plan Note (Addendum)
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 We talk about lung cancer screening however she does not qualify at this point based on her smoking history

## 2015-04-19 ENCOUNTER — Other Ambulatory Visit (INDEPENDENT_AMBULATORY_CARE_PROVIDER_SITE_OTHER): Payer: BLUE CROSS/BLUE SHIELD

## 2015-04-19 DIAGNOSIS — Z0000 Encounter for general adult medical examination without abnormal findings: Secondary | ICD-10-CM | POA: Diagnosis not present

## 2015-04-19 LAB — BASIC METABOLIC PANEL
BUN: 15 mg/dL (ref 6–23)
CO2: 26 mEq/L (ref 19–32)
Calcium: 8.6 mg/dL (ref 8.4–10.5)
Chloride: 106 mEq/L (ref 96–112)
Creatinine, Ser: 0.75 mg/dL (ref 0.40–1.20)
GFR: 84.27 mL/min (ref >60.00)
Glucose, Bld: 98 mg/dL (ref 70–99)
POTASSIUM: 3.9 meq/L (ref 3.5–5.1)
SODIUM: 139 meq/L (ref 135–145)

## 2015-04-19 LAB — LIPID PANEL
CHOL/HDL RATIO: 5
Cholesterol: 243 mg/dL — ABNORMAL HIGH (ref 0–200)
HDL: 52.8 mg/dL (ref >39.00)
LDL Cholesterol: 157 mg/dL — ABNORMAL HIGH (ref 0–99)
NonHDL: 190.2
Triglycerides: 166 mg/dL — ABNORMAL HIGH (ref 0.0–149.0)
VLDL: 33.2 mg/dL (ref 0.0–40.0)

## 2015-04-19 LAB — TSH: TSH: 2.6 u[IU]/mL (ref 0.35–4.50)

## 2015-04-19 NOTE — Assessment & Plan Note (Signed)
Hypertension, Started lisinopril 01/03/2015, developed cough, was switch to  ARB , BPs are very good. Plan: Continue with present care, check a BMP

## 2015-04-19 NOTE — Assessment & Plan Note (Signed)
Sleep apnea, Complain of fatigue and feeling sleepy, likely related to sleep apnea, has been unable to tolerate a CPAP, recommend try again. Will call her CPAP provider and try different masks and if a prescription is needed I will help her.

## 2015-06-06 ENCOUNTER — Other Ambulatory Visit: Payer: Self-pay

## 2015-06-06 DIAGNOSIS — Z1231 Encounter for screening mammogram for malignant neoplasm of breast: Secondary | ICD-10-CM

## 2015-06-28 ENCOUNTER — Other Ambulatory Visit: Payer: Self-pay | Admitting: Internal Medicine

## 2015-07-03 ENCOUNTER — Encounter: Payer: Self-pay | Admitting: Gastroenterology

## 2015-08-08 ENCOUNTER — Ambulatory Visit
Admission: RE | Admit: 2015-08-08 | Discharge: 2015-08-08 | Disposition: A | Discharge status: Home / Self Care | Payer: BLUE CROSS/BLUE SHIELD | Source: Ambulatory Visit

## 2015-08-08 DIAGNOSIS — Z1231 Encounter for screening mammogram for malignant neoplasm of breast: Secondary | ICD-10-CM

## 2015-08-21 ENCOUNTER — Ambulatory Visit (INDEPENDENT_AMBULATORY_CARE_PROVIDER_SITE_OTHER): Payer: BLUE CROSS/BLUE SHIELD | Admitting: Podiatry

## 2015-08-21 ENCOUNTER — Encounter: Payer: Self-pay | Admitting: Podiatry

## 2015-08-21 VITALS — BP 151/101 | HR 71 | Ht 69.0 in | Wt 187.0 lb

## 2015-08-21 DIAGNOSIS — M21962 Unspecified acquired deformity of left lower leg: Secondary | ICD-10-CM | POA: Diagnosis not present

## 2015-08-21 DIAGNOSIS — M7742 Metatarsalgia, left foot: Secondary | ICD-10-CM | POA: Insufficient documentation

## 2015-08-21 DIAGNOSIS — M216X2 Other acquired deformities of left foot: Secondary | ICD-10-CM | POA: Diagnosis not present

## 2015-08-21 NOTE — Patient Instructions (Signed)
Seen for pain in left foot. Noted of excess motion on the first and 5th Metatarsal bone. Metatarsal binder dispensed. Need orthotics.

## 2015-08-21 NOTE — Progress Notes (Signed)
Subjective: 59 year old female presents complaining of pain in whole left foot, hurts in the morning especially, started about a month ago. 5th MPJ lateral aspect gets very puffy in the morning. She cannot pin point the source of pain because the whole foot hurt.  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 Been doing walking exercise about a mile a day with new walking shoes.   Review of Systems - Negative except hypertension and having high cholesterol.    Objective: Vascular: All pedal pulses are palpable. No edema or erythema noted. Dermatologic: No abnormal findings. Wearing artificial nail on both great toes. Neurologic: All epicritic and tactile sensations grossly intact. Orthopedic: Positive of excess sagittal plane motion of the first ray with loading of forefoot L>R. Excess sagittal plane motion of the 5th ray with weight bearing.  Mild enlarged first and 5th Metatarsal head left. Positive of STJ excess pronation with weight bearing.   Assessment: Metatarsalgia left foot. Hypermobile first and 5th ray left. Long first metatarsal left. STJ excess pronation left.  Plan: Reviewed clinical findings and available treatment options. Metatarsal binder dispensed to stabilize the first ray. May take Advils as needed.  Patient will return to prepare for orthotics.

## 2015-10-10 ENCOUNTER — Other Ambulatory Visit: Payer: Self-pay | Admitting: Internal Medicine

## 2015-10-17 ENCOUNTER — Encounter: Payer: Self-pay | Admitting: Internal Medicine

## 2015-10-17 ENCOUNTER — Ambulatory Visit (INDEPENDENT_AMBULATORY_CARE_PROVIDER_SITE_OTHER): Payer: BLUE CROSS/BLUE SHIELD | Admitting: Internal Medicine

## 2015-10-17 VITALS — BP 126/88 | HR 100 | Temp 98.0°F | Ht 69.0 in | Wt 193.1 lb

## 2015-10-17 DIAGNOSIS — E663 Overweight: Secondary | ICD-10-CM | POA: Diagnosis not present

## 2015-10-17 DIAGNOSIS — Z09 Encounter for follow-up examination after completed treatment for conditions other than malignant neoplasm: Secondary | ICD-10-CM

## 2015-10-17 DIAGNOSIS — I10 Essential (primary) hypertension: Secondary | ICD-10-CM | POA: Diagnosis not present

## 2015-10-17 MED ORDER — NAPROXEN 500 MG PO TABS: 500.0000 mg | ORAL_TABLET | Freq: Every morning | ORAL | Status: DC

## 2015-10-17 MED ORDER — LOSARTAN POTASSIUM 100 MG PO TABS: 100.0000 mg | ORAL_TABLET | Freq: Every day | ORAL | Status: DC

## 2015-10-17 MED ORDER — FLUTICASONE PROPIONATE 50 MCG/ACT NA SUSP: 2.0000 | Freq: Every day | NASAL | Status: DC

## 2015-10-17 MED ORDER — FLUOXETINE HCL 20 MG PO CAPS: 20.0000 mg | ORAL_CAPSULE | Freq: Every day | ORAL | Status: DC

## 2015-10-17 NOTE — Progress Notes (Signed)
Pre visit review using our clinic review tool, if applicable. No additional management support is needed unless otherwise documented below in the visit note. 

## 2015-10-17 NOTE — Progress Notes (Signed)
Subjective:    Patient ID: Sheila Fuller, female    DOB: 04-26-1956, 59 y.o.   MRN: [id number]  DOS:  10/17/2015 Type of visit - description : rov Interval history: Overweight : pt's main concern is to loose wt, d/c prozac ? (may increase appetite) HTN: good med compliance, amb BPs wnl when checked  Anxiety: Well-controlled on Prilosec.   Review of Systems Denies chest pain or difficulty breathing. No nausea, vomiting, diarrhea  Past Medical History  Diagnosis Date  . Allergic rhinitis   . Depression   . Hyperlipidemia   . Carcinoma in situ of perianal skin     +XRT & chemo 2000; no further f/u w/ specialist.   . Microhematuria 01/2006    saw Dr.Peterson, renal u/s: r pyelocaliectasis but CT was neg, cysto neg  . Lactose intolerance 10/04/2013  . Asthma     seasonally  . Shortness of breath     only with asthma flare up  . Sleep apnea     rarely uses cpap  . Anxiety   . GERD (gastroesophageal reflux disease)     takes zantac maybe 2/week  . Arthritis   . Cancer [facility])     skin    Past Surgical History  Procedure Laterality Date  . Cervical cauterization    . Cesarean section    . Nasal septum surgery    . Ganglion cyst excision    . Anterior cervical decomp/discectomy fusion N/A 08/23/2014    Procedure: C5-6 Anterior Cervical Discectomy and Fusion, Allograft, Plate;  Surgeon: Marybelle Killings, MD;  Location: [place];  Service: Orthopedics;  Laterality: N/A;    Social History   Social History  . Marital Status: Married    Spouse Name: N/A  . Number of Children: 1  . 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 Smoking status: Former Smoker -- 0.30 packs/day for 3 years    Types: Cigarettes  . Smokeless tobacco: Never Used     Comment: quit 04-2013 (Chantix)  . Alcohol Use: 1.8 oz/week    3 Standard drinks or equivalent per week  . Drug Use: No  . Sexual Activity: Not on file    Other Topics Concern  . Not on file   Social History Narrative   remarried 10-11, 1 child        Medication List       This list is accurate as of: 10/17/15 11:59 PM.  Always use your most recent med list.               albuterol 108 (90 BASE) MCG/ACT inhaler  Commonly known as:  PROVENTIL HFA;VENTOLIN HFA  Inhale 2 puffs by mouth four times a day if needed.     cyclobenzaprine 5 MG tablet  Commonly known as:  FLEXERIL  Take 1 tablet (5 mg total) by mouth 2 (two) times daily as needed for muscle spasms.     ezetimibe 10 MG tablet  Commonly known as:  ZETIA  Take 1 tablet (10 mg total) by mouth daily.     FLUoxetine 20 MG capsule  Commonly known as:  PROZAC  Take 1 capsule (20 mg total) by mouth daily.     fluticasone 50 MCG/ACT nasal spray  Commonly known as:  FLONASE  Place 2 sprays into both nostrils daily.     HYDROcodone-acetaminophen 7.5-325 MG tablet  Commonly known as:  NORCO  take 1 tablet by mouth every 6 to 8 hours if needed for pain     ibuprofen 800 MG tablet  Commonly known as:  ADVIL,MOTRIN  take 1 tablet by mouth every 6 to 8 hours if needed for pain     losartan 100 MG tablet  Commonly known as:  COZAAR  Take 1 tablet (100 mg total) by mouth daily.     naproxen 500 MG tablet  Commonly known as:  NAPROSYN  Take 1 tablet (500 mg total) by mouth every morning.     PAZEO 0.7 % Soln  Generic drug:  Olopatadine HCl     ranitidine 75 MG tablet  Commonly known as:  ZANTAC  Take 75 mg by mouth daily as needed.           Objective:   Physical Exam BP 126/88 mmHg  Pulse 100  Temp(Src) 98 F (36.7 C) (Oral)  Ht 5\' 9"  (1.753 m)  Wt 193 lb 2 oz (87.601 kg)  BMI 28.51 kg/m2  SpO2 99% General:   Well developed, well nourished . NAD.  HEENT:  Normocephalic . Face symmetric, atraumatic Lungs:  CTA B Normal respiratory effort, no intercostal retractions, no accessory muscle use. Heart: RRR,  no murmur.  No pretibial edema bilaterally   Skin: Not pale. Not jaundice Neurologic:  alert & oriented X3.  Speech normal, gait appropriate for age and unassisted Psych--  Cognition and judgment appear intact.  Cooperative with normal attention span and concentration.  Behavior appropriate. No anxious or depressed appearing.      Assessment & Plan:   Assessment HTN Hyperlipidemia Anxiety, depression GERD Seasonal asthma Allergies OSA: Uses CPAP irregularly MSK: DJD, occasionally uses pain medication H/o Carcinoma in situ, perianal skin, XRT, chemotherapy 2000. Not further follow-ups with specialist H/o Microhematuria 2007, Dr. Terance Hart, renal US showed right pyelocaliectasis, CT was negative, cystoscopy negative  PLAN: Overweight: Patient BMI consistent with being overweight, she has gained approximately 20 pounds in the last several years. Wonders if dc Prozac is a good idea as SSRIs can increase appetite. No recent serious attempt to lose weight. Anxiety is under excellent control, I don't recommend to stop SSRIs Rec diet and exercise, extensive discussion about diet including calorie counting . Consider rx Qsymia or Belviq HTN: Recent BMP normal, no change Allergies:dc  antihistaminics, they may increase appetite, use Flonase consistently Primary care: Already had a flu shot RTC 6 months

## 2015-10-17 NOTE — Patient Instructions (Signed)
Stop Clarinex  Take Flonase consistently  Consider use MYFITNESSPAL or similar app to count calories   Before you leave the office:   [place]  Schedule a complete physical exam to be done in 6 months  Please be fasting

## 2015-10-19 DIAGNOSIS — Z09 Encounter for follow-up examination after completed treatment for conditions other than malignant neoplasm: Secondary | ICD-10-CM | POA: Insufficient documentation

## 2015-10-19 NOTE — Assessment & Plan Note (Signed)
Overweight: Patient BMI consistent with being overweight, she has gained approximately 20 pounds in the last several years. Wonders if dc Prozac is a good idea as SSRIs can increase appetite. No recent serious attempt to lose weight. Anxiety is under excellent control, I don't recommend to stop SSRIs Rec diet and exercise, extensive discussion about diet including calorie counting . Consider rx Qsymia or Belviq HTN: Recent BMP normal, no change Allergies:dc  antihistaminics, they may increase appetite, use Flonase consistently Primary care: Already had a flu shot RTC 6 months

## 2015-10-19 NOTE — Addendum Note (Signed)
Addended byDamita Dunnings D on: 10/19/2015 09:42 AM   Modules accepted: Medications

## 2015-11-23 ENCOUNTER — Telehealth: Payer: Self-pay | Admitting: Pulmonary Disease

## 2015-11-23 NOTE — Telephone Encounter (Signed)
LMTCB x1- has pt been seen in office?

## 2015-11-26 NOTE — Telephone Encounter (Signed)
Wed 9 am

## 2015-11-26 NOTE — Telephone Encounter (Signed)
Patient says that she spoke with [facility] [place] regarding CPAP machine.  She is in need of a new CPAP machine.  She has had her machine for 8 years.  Advised patient that she would need to be seen in order to place order for new machine since she has not been seen here since 2012.  Dr. Elsworth Soho, please advise where I can add patient for Consult.  Thanks.

## 2015-11-26 NOTE — Telephone Encounter (Signed)
Called and spoke with patient, scheduled to see RA on Wed 2/1 at 9am Nothing further needed.

## 2015-11-28 ENCOUNTER — Ambulatory Visit (INDEPENDENT_AMBULATORY_CARE_PROVIDER_SITE_OTHER): Payer: BLUE CROSS/BLUE SHIELD | Admitting: Pulmonary Disease

## 2015-11-28 ENCOUNTER — Encounter: Payer: Self-pay | Admitting: Pulmonary Disease

## 2015-11-28 VITALS — BP 154/100 | HR 77 | Ht 68.5 in | Wt 193.6 lb

## 2015-11-28 DIAGNOSIS — G4733 Obstructive sleep apnea (adult) (pediatric): Secondary | ICD-10-CM | POA: Diagnosis not present

## 2015-11-28 DIAGNOSIS — I10 Essential (primary) hypertension: Secondary | ICD-10-CM | POA: Diagnosis not present

## 2015-11-28 NOTE — Progress Notes (Signed)
Subjective:    Patient ID: Sheila Fuller, female    DOB: 1956/04/09, 60 y.o.   MRN: [id number]  HPI  54/F for evaluation and management of moderate OSA.  11/2010 - PSG 175 lbs - mod AHI 26/h She has been maintained on nasal CPAP 7 cm since. She did not like nasal pillows and is currently using a nasal mask with chinstrap. She has not changed her supplies and many years. She recently had neck surgery and is finding it difficult to sleep with the up at this on her face. She also reports significant life stressors Epworth sleepiness score is 8 Bedtime is around 10 PM, sleep latency is minimal, denies nocturnal awakenings and is out of bed at 5:30 AM feeling rested with occasional dryness of mouth but denies headaches She has gained 15 pounds since her study  There is no history suggestive of cataplexy, sleep paralysis or parasomnias BP is elevated today-and she admits to not having used losartan   Chief Complaint  Patient presents with  . Sleep Consult    Coming in to get new CPAP and supplies; Sleep Study done 2009 (in [place]) Former Dr. Annamaria Boots patient. Epworth Score 8; Was using DME: [facility] they are no longer in business, no download available      Past Medical History  Diagnosis Date  . Allergic rhinitis   . Depression   . Hyperlipidemia   . Carcinoma in situ of perianal skin     +XRT & chemo 2000; no further f/u w/ specialist.   . Microhematuria 01/2006    saw Dr.Peterson, renal u/s: r pyelocaliectasis but CT was neg, cysto neg  . Lactose intolerance 10/04/2013  . Asthma     seasonally  . Shortness of breath     only with asthma flare up  . Sleep apnea     rarely uses cpap  . Anxiety   . GERD (gastroesophageal reflux disease)     takes zantac maybe 2/week  . Arthritis   . Cancer [facility])     skin    Past Surgical History  Procedure Laterality Date  . Cervical cauterization    . Cesarean section    . Nasal septum surgery    . Ganglion cyst excision    .  Anterior cervical decomp/discectomy fusion N/A 08/23/2014    Procedure: C5-6 Anterior Cervical Discectomy and Fusion, Allograft, Plate;  Surgeon: Marybelle Killings, MD;  Location: [place];  Service: Orthopedics;  Laterality: N/A;    Allergies  Allergen Reactions  . Statins     REACTION: myalgia  . Sulfonamide Derivatives Rash     Social History   Social History  . Marital Status: Married    Spouse Name: N/A  . Number of Children: 1  . 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 Smoking status: Former Smoker -- 0.30 packs/day for 3 years    Types: Cigarettes  . Smokeless tobacco: Never Used     Comment: quit 04-2013 (Chantix)  . Alcohol Use: 1.8 oz/week    3 Standard drinks or equivalent per week  . Drug Use: No  . Sexual Activity: Not on file   Other Topics Concern  . Not on file   Social History Narrative   remarried 10-11, 1 child    Family History  Problem Relation Age of Onset  . Breast cancer Neg Hx   . Coronary artery  disease Neg Hx   . Hypertension Neg Hx   . Stroke Neg Hx   . Sudden death Neg Hx   . Heart attack Neg Hx   . Colon cancer Other     aunt  . Lung cancer Father     smoker  . Diabetes Mother     late onset  . Hyperlipidemia Mother   . Cervical cancer Sister      Review of Systems  Constitutional: Negative for fever, chills and unexpected weight change.  HENT: Negative for congestion, dental problem, ear pain, nosebleeds, postnasal drip, rhinorrhea, sinus pressure, sneezing, sore throat, trouble swallowing and voice change.   Eyes: Negative for visual disturbance.  Respiratory: Positive for apnea. Negative for cough, choking and shortness of breath.   Cardiovascular: Negative for chest pain and leg swelling.  Gastrointestinal: Negative for vomiting, abdominal pain and diarrhea.  Genitourinary: Negative for difficulty urinating.  Musculoskeletal: Negative for  arthralgias.  Skin: Negative for rash.  Neurological: Negative for tremors, syncope and headaches.  Hematological: Does not bruise/bleed easily.       Objective:   Physical Exam  Gen. Pleasant, obese, in no distress ENT - no lesions, no post nasal drip, class 2 airway Neck: No JVD, no thyromegaly, no carotid bruits Lungs: no use of accessory muscles, no dullness to percussion, decreased without rales or rhonchi  Cardiovascular: Rhythm regular, heart sounds  normal, no murmurs or gallops, no peripheral edema Musculoskeletal: No deformities, no cyanosis or clubbing , no tremors       Assessment & Plan:

## 2015-11-28 NOTE — Patient Instructions (Signed)
CPAP supplies will be renewed - new nasal mask, new DME We will check download in 1 month & change pressure if required  BP is high today - Take losartan & FU with PCP

## 2015-11-28 NOTE — Assessment & Plan Note (Signed)
CPAP supplies will be renewed - new nasal mask, new DME We will check download in 1 month & change pressure if required  Weight loss encouraged, compliance with goal of at least 4-6 hrs every night is the expectation. Advised against medications with sedative side effects Cautioned against driving when sleepy - understanding that sleepiness will vary on a day to day basis

## 2015-11-28 NOTE — Assessment & Plan Note (Signed)
BP is high today - Take losartan & FU with PCP

## 2016-01-29 ENCOUNTER — Ambulatory Visit: Admitting: Neurological Surgery

## 2016-02-06 ENCOUNTER — Ambulatory Visit: Admitting: Neurological Surgery

## 2016-02-06 NOTE — Progress Notes (Signed)
.  Progress Notes  .  Patient: [name]  Provider: Glendell Docker    .  DOB:[birth year] Age: 60 Y Sex: Female  .  PCP: Shelly Flatten DO  Date: 02/06/2016  .  --------------------------------------------------------------------------------  .  REASON FOR APPOINTMENT  .  1. postoperative followup status post L4-5 laminectomy and TLIF  (transforaminal lumbar interbody fusion) with bilateral pedicle  screw fixation 10/02/15  .  HISTORY OF PRESENT ILLNESS  .  Associated Providers:  Primary Care Provider  Name: Shelly Flatten, MD Address: [address]  [place] [address] Phone: [phone] [phone].  Marland Kitchen  NEUROSURGERY:  Johnny is s/p L4-5 decompression  and fusion for spinal stenosis and spondylolisthesis in December  2016. She complains of low back pain, stiffness, weakness with  walking or any strenous activity. She has trouble rolling over in  bed or side to side. She is going to PT for 27 sessions which is  going to end next week and then she will do her own exercises at  the gym.  .  CURRENT MEDICATIONS  .  Taking Citalopram Hydrobromide 20 MG Tablet 1 tablet Once a day  Taking Mupirocin Ointment 1 application in nostril as needed for  MRSA flareup as directed  Taking Omeprazole 40 MG Capsule Delayed Release 1 capsule. two  days on, one day off as directed  Taking Seroquel 75 MG 1.5 tablet at bedtime Once a day  Taking Tums 500 MG Tablet Chewable 1 tablet as needed for GI  upset Four times a day  Taking Tylenol 325 MG Tablet 2 tablets as needed every 6 hrs  Taking Vitamin D3 2000 UNIT Tablet 1 tablet Once a day  Taking Zoloft 100 MG Tablet 1 tablet in morning Once a day  Medication List reviewed and reconciled with the patient  .  PAST MEDICAL HISTORY  .  Kidney Stones  Chronic Depression  Anxiety  Post Tramatic Stress Disorder  MRSA left nostril (flares up occasionally)  .  ALLERGIES  .  sulfa  penicillin  latex  dark dye  .  SURGICAL HISTORY  .  Appendectomy  Percutaneous  diskectomy L4-5  L4-5 laminectomy and TLIF with bilateral L4-5 pedicle screw  fixation 10/02/2015  .  FAMILY HISTORY  .  Mother: deceased  Father: deceased  5 sister(s) .  Marland Kitchen  SOCIAL HISTORY  .  .  Tobaccohistory:Never smoked.  .  Work/Occupation: unemployed, previously in assisted living  facility.  Marland Kitchen  HOSPITALIZATION/MAJOR DIAGNOSTIC PROCEDURE  .  see surgeries  .  VITAL SIGNS  .  Pain scale 8, Ht-in 5'1", Wt-lbs 119, BMI 22.48.  Marland Kitchen  PHYSICAL EXAMINATION  .  NEUROSURGERY:  MOTOR Lower Extremities  : 5/5 strength in the bilateral lower  extremities. :5/5 strength in the bilateral lower extremities.  Reflexes  Left Knee Jerk 2+, Right Knee Jerk 2+, Left Ankle Jerk  1+, Right Ankle Jerk 1+. Right Ankle Jerk1+ , Left Ankle Jerk1+ ,  Right Knee Jerk2+ , Left Knee Jerk2+.  DIAGNOSTIC STUDIES:  RADIOLOGY  I reviewed CT lumbar spine performed on 01/29/16 at   shows excellent hardware position without evidence of  loosening and with evidence of early fusion. MRI  I reviewed MRI  lumbar spine performed with and without gadolinium on 01/29/16 at  [facility] shows excellent decompression.  .  ASSESSMENTS  .  Spondylolisthesis at L4-L5 level - M43.16 (Primary)  .  Spinal stenosis at L4-L5 level - M48.06  .  Imp: Azarah's postop imaging  looks excellent and I think her  residual pain is likely related to muscular deconditioning and  possibly amplified by her chronic pain issues. I recommended  continued PT and exercise.  .  FOLLOW UP  .  1 Year (Reason: lumbar flex/ex films)  .  Electronically signed by Glendell Docker , MD on  02/08/2016 at 10:22 AM EDT  .  Document electronically signed by Glendell Docker    .

## 2016-02-06 NOTE — Progress Notes (Signed)
* * *        **[name]    --- ---    75 Y old Female, DOB: 24-Sep-1956, External MRN: [id number]    Account Number: [id number]    [address], [place], [id number]    Home: [phone]    Insurance: [employer]    PCP: Shelly Flatten, DO Referring: Shelly Flatten, DO    Appointment Facility: Neurosurgery        * * *    02/06/2016  Progress Notes: Glendell Docker, MD **CHN#:** [id number]    --- ---    ---        Reason for Appointment    ---      1\. postoperative followup status post L4-5 laminectomy and TLIF  (transforaminal lumbar interbody fusion) with bilateral pedicle screw fixation  10/02/15    ---      History of Present Illness    ---     _Associated Providers_ :    Primary Care Provider Name: Shelly Flatten, MD Address: [address] [place] [address] Phone: [phone] Fax:[phone].    _NEUROSURGERY_ :    [name] is s/p L4-5 decompression and fusion for spinal stenosis and  spondylolisthesis in December 2016. She complains of low back pain, stiffness,  weakness with walking or any strenous activity. She has trouble rolling over  in bed or side to side. She is going to PT for 27 sessions which is going to  end next week and then she will do her own exercises at the gym.      Current Medications    ---    Taking     * Citalopram Hydrobromide 20 MG Tablet 1 tablet Once a day    ---    * Mupirocin Ointment 1 application in nostril as needed for MRSA flareup as directed    ---    * Omeprazole 40 MG Capsule Delayed Release 1 capsule. two days on, one day off as directed    ---    * Seroquel 75 MG 1.5 tablet at bedtime Once a day    ---    * Tums 500 MG Tablet Chewable 1 tablet as needed for GI upset Four times a day    ---    * Tylenol 325 MG Tablet 2 tablets as needed every 6 hrs    ---    * Vitamin D3 2000 UNIT Tablet 1 tablet Once a day    ---    * Zoloft 100 MG Tablet 1 tablet in morning Once a day    ---    * Medication List reviewed and  reconciled with the patient    ---      Past Medical History    ---       Kidney Stones .        ---    Chronic Depression .        ---    Anxiety.        ---    Post Tramatic Stress Disorder .        ---    MRSA left nostril (flares up occasionally).        ---      Surgical History    ---      Appendectomy    ---    Percutaneous diskectomy L4-5    ---    L4-5 laminectomy and TLIF with bilateral L4-5 pedicle screw fixation 10/02/2015    ---  Family History    ---      Mother: deceased    ---    Father: deceased    ---    5 sister(s) .    ---      Social History    ---    Tobacco  history: Never smoked.    Work/Occupation: unemployed, previously in assisted living facility.      Allergies    ---      sulfa    ---    penicillin    ---    latex    ---    dark dye    ---      Hospitalization/Major Diagnostic Procedure    ---      see surgeries    ---      Vital Signs    ---    Pain scale 8, Ht-in 5'1", Wt-lbs 119, BMI 22.48.      Physical Examination    ---     _NEUROSURGERY_ :    MOTOR Lower Extremities : 5/5 strength in the bilateral lower extremities.  Reflexes Right Ankle Jerk 1+, Left Ankle Jerk 1+, Right Knee Jerk 2+, Left  Knee Jerk 2+.    _DIAGNOSTIC STUDIES_ :    RADIOLOGY I reviewed CT lumbar spine performed on 01/29/16 at [facility] shows  excellent hardware position without evidence of loosening and with evidence of  early fusion. MRI I reviewed MRI lumbar spine performed with and without  gadolinium on 01/29/16 at [facility] shows excellent decompression.          Assessments    ---    1\. Spondylolisthesis at L4-L5 level - M43.16 (Primary)    ---    2\. Spinal stenosis at L4-L5 level - M48.06    ---      Imp: [name]'s postop imaging looks excellent and I think her residual pain  is likely related to muscular deconditioning and possibly amplified by her  chronic pain issues. I recommended continued PT and exercise.    ---      Follow Up    ---    1 Year (Reason: lumbar flex/ex films)    Electronically signed by  Glendell Docker , MD on 02/08/2016 at 10:22 AM EDT    Sign off status: Completed        * * *        Neurosurgery    [address]    [place], [place] [address]    Tel: [phone]    Fax: [phone]              * * *          Patient: [name], [name] DOB: 06/09/1956 Progress Note: Glendell Docker, MD  02/06/2016    ---    Note generated by eClinicalWorks EMR/PM Software ([email or web address])

## 2016-02-26 ENCOUNTER — Telehealth: Payer: Self-pay

## 2016-02-26 MED ORDER — ALBUTEROL SULFATE HFA 108 (90 BASE) MCG/ACT IN AERS: INHALATION_SPRAY | RESPIRATORY_TRACT | Status: DC

## 2016-02-26 NOTE — Telephone Encounter (Signed)
New Rx for Ventolin sent to the pharmacy.  Pt has a CPE appt scheduled on 04/01/16 @ 9 am.

## 2016-03-10 ENCOUNTER — Ambulatory Visit (INDEPENDENT_AMBULATORY_CARE_PROVIDER_SITE_OTHER): Payer: BLUE CROSS/BLUE SHIELD | Admitting: Medical

## 2016-03-10 ENCOUNTER — Encounter: Payer: Self-pay | Admitting: Medical

## 2016-03-10 VITALS — BP 140/90 | HR 77 | Temp 97.0°F | Ht 69.0 in | Wt 188.8 lb

## 2016-03-10 DIAGNOSIS — R195 Other fecal abnormalities: Secondary | ICD-10-CM | POA: Diagnosis not present

## 2016-03-10 DIAGNOSIS — Z86010 Personal history of colonic polyps: Secondary | ICD-10-CM | POA: Diagnosis not present

## 2016-03-10 DIAGNOSIS — Z85048 Personal history of other malignant neoplasm of rectum, rectosigmoid junction, and anus: Secondary | ICD-10-CM

## 2016-03-10 NOTE — Progress Notes (Addendum)
Subjective:    Patient ID: Sheila Fuller, female    DOB: Mar 14, 1956, 60 y.o.   MRN: [id number]  HPI   Pt in for evaluation. Pt has been having some loose stools just recently. This happened over the weekend. Pt states last week she had some loose stools all week. She states she diarrhea for 2 days. Three loose stools a day. Pt states clear gel look at times to her loose stools. Pt has history of rectal cancer 17 years ago. Pt is due for colonosocpy this year. Due every 5 years. Pt has seen Dr. Ardis Hughs. With loose stools and recent diarrhea. No abdomen pain. Pt states she took probiotics over the weekend and think this helped a lot.  On review no suspsicious food prior to onset of diarrhea and no known contacts with GI type illness.   With recent loose stools no abd pain, no fever, no chills, no sweats, no black or bloody stools.   Review of Systems  Constitutional: Negative for chills, activity change and fatigue.  Respiratory: Negative for cough, chest tightness, shortness of breath and wheezing.   Cardiovascular: Negative for chest pain and palpitations.  Gastrointestinal: Positive for diarrhea. Negative for nausea, vomiting, abdominal pain, blood in stool, anal bleeding and rectal pain.       Some loose stools recently. See hpi.  Genitourinary: Negative for dysuria and flank pain.  Musculoskeletal: Negative for back pain.  Skin: Negative for rash.  Neurological: Negative for dizziness and headaches.  Hematological: Negative for adenopathy. Does not bruise/bleed easily.  Psychiatric/Behavioral: Negative for behavioral problems and confusion.    Past Medical History  Diagnosis Date  . Allergic rhinitis   . Depression   . Hyperlipidemia   . Carcinoma in situ of perianal skin     +XRT & chemo 2000; no further f/u w/ specialist.   . Microhematuria 01/2006    saw Dr.Peterson, renal u/s: r pyelocaliectasis but CT was neg, cysto neg  . Lactose intolerance 10/04/2013  . Asthma    seasonally  . Shortness of breath     only with asthma flare up  . Sleep apnea     rarely uses cpap  . Anxiety   . GERD (gastroesophageal reflux disease)     takes zantac maybe 2/week  . Arthritis   . Cancer [facility])     skin     Social History   Social History  . Marital Status: Married    Spouse Name: N/A  . Number of Children: 1  . 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 Smoking status: Former Smoker -- 0.30 packs/day for 3 years    Types: Cigarettes  . Smokeless tobacco: Never Used     Comment: quit 04-2013 (Chantix)  . Alcohol Use: 1.8 oz/week    3 Standard drinks or equivalent per week  . Drug Use: No  . Sexual Activity: Not on file   Other Topics Concern  . Not on file   Social History Narrative   remarried 10-11, 1 child    Past Surgical History  Procedure Laterality Date  . Cervical cauterization    . Cesarean section    . Nasal septum surgery    . Ganglion cyst excision    . Anterior cervical decomp/discectomy fusion N/A 08/23/2014    Procedure: C5-6 Anterior Cervical Discectomy and Fusion, Allograft, Plate;  Surgeon: Marybelle Killings, MD;  Location:  [facility];  Service: Orthopedics;  Laterality: N/A;    Family History  Problem Relation Age of Onset  . Breast cancer Neg Hx   . Coronary artery disease Neg Hx   . Hypertension Neg Hx   . Stroke Neg Hx   . Sudden death Neg Hx   . Heart attack Neg Hx   . Colon cancer Other     aunt  . Lung cancer Father     smoker  . Diabetes Mother     late onset  . Hyperlipidemia Mother   . Cervical cancer Sister     Allergies  Allergen Reactions  . Statins     REACTION: myalgia  . Sulfonamide Derivatives Rash    Current Outpatient Prescriptions on File Prior to Visit  Medication Sig Dispense Refill  . albuterol (PROVENTIL HFA;VENTOLIN HFA) 108 (90 Base) MCG/ACT inhaler Inhale 2 puffs by mouth four times a day if needed. 18 Inhaler 1   . cyclobenzaprine (FLEXERIL) 5 MG tablet Take 1 tablet (5 mg total) by mouth 2 (two) times daily as needed for muscle spasms. 60 tablet 2  . FLUoxetine (PROZAC) 20 MG capsule Take 1 capsule (20 mg total) by mouth daily. 30 capsule 6  . fluticasone (FLONASE) 50 MCG/ACT nasal spray Place 2 sprays into both nostrils daily. 16 g 6  . HYDROcodone-acetaminophen (NORCO) 7.5-325 MG tablet take 1 tablet by mouth every 6 to 8 hours if needed for pain  0  . ibuprofen (ADVIL,MOTRIN) 800 MG tablet take 1 tablet by mouth every 6 to 8 hours if needed for pain  0  . losartan (COZAAR) 100 MG tablet Take 1 tablet (100 mg total) by mouth daily. 30 tablet 6  . naproxen (NAPROSYN) 500 MG tablet Take 1 tablet (500 mg total) by mouth every morning. 30 tablet 6  . PAZEO 0.7 % SOLN   0   No current facility-administered medications on file prior to visit.    BP 140/90 mmHg  Pulse 77  Temp(Src) 97 F (36.1 C) (Oral)  Ht 5\' 9"  (1.753 m)  Wt 188 lb 12.8 oz (85.639 kg)  BMI 27.87 kg/m2  SpO2 98%       Objective:   Physical Exam  General Appearance- Not in acute distress.  HEENT Eyes- Scleraeral/Conjuntiva-bilat- Not Yellow. Mouth & Throat- Normal.  Chest and Lung Exam Auscultation: Breath sounds:-Normal. Adventitious sounds:- No Adventitious sounds.  Cardiovascular Auscultation:Rythm - Regular. Heart Sounds -Normal heart sounds.  Abdomen Inspection:-Inspection Normal.  Palpation/Perucssion: Palpation and Percussion of the abdomen reveal- Non Tender, No Rebound tenderness, No rigidity(Guarding) and No Palpable abdominal masses.  Liver:-Normal.  Spleen:- Normal.   Back- no cva tenderness.      Assessment & Plan:  You had mild loose stools and diarrhea recently but resolved now. If you have 3 or more loose stools that are recurrent then turn in stool panel. Pick that up today so you can do studies in event loose stools return. I will go ahead and but in your GI referral.  Did discuss also  could continue to use probiotics.  Follow up as regularly scheduled with pcp or as needed.

## 2016-03-10 NOTE — Progress Notes (Signed)
Pre visit review using our clinic review tool, if applicable. No additional management support is needed unless otherwise documented below in the visit note. 

## 2016-03-10 NOTE — Patient Instructions (Addendum)
You had mild loose stools and diarrhea recently but resolved now. If you have 3 or more loose stools that are recurrent then turn in stool panel. Pick that up today so you can do studies in event loose stools return. I will go ahead and but in your GI referral.  Did discuss also could continue to use probiotics.  Follow up as regularly scheduled with pcp or as needed.

## 2016-04-01 ENCOUNTER — Encounter: Payer: Self-pay | Admitting: Internal Medicine

## 2016-04-01 ENCOUNTER — Ambulatory Visit (INDEPENDENT_AMBULATORY_CARE_PROVIDER_SITE_OTHER): Payer: BLUE CROSS/BLUE SHIELD | Admitting: Internal Medicine

## 2016-04-01 VITALS — BP 120/78 | HR 70 | Temp 97.0°F | Ht 69.0 in | Wt 187.5 lb

## 2016-04-01 DIAGNOSIS — Z01419 Encounter for gynecological examination (general) (routine) without abnormal findings: Secondary | ICD-10-CM | POA: Diagnosis not present

## 2016-04-01 DIAGNOSIS — Z87891 Personal history of nicotine dependence: Secondary | ICD-10-CM

## 2016-04-01 DIAGNOSIS — Z0000 Encounter for general adult medical examination without abnormal findings: Secondary | ICD-10-CM | POA: Diagnosis not present

## 2016-04-01 DIAGNOSIS — Z801 Family history of malignant neoplasm of trachea, bronchus and lung: Secondary | ICD-10-CM

## 2016-04-01 LAB — LIPID PANEL
CHOLESTEROL: 237 mg/dL — AB (ref 0–200)
HDL: 56.7 mg/dL (ref >39.00)
LDL Cholesterol: 150 mg/dL — ABNORMAL HIGH (ref 0–99)
NONHDL: 180.62
Total CHOL/HDL Ratio: 4
Triglycerides: 153 mg/dL — ABNORMAL HIGH (ref 0.0–149.0)
VLDL: 30.6 mg/dL (ref 0.0–40.0)

## 2016-04-01 LAB — CBC WITH DIFFERENTIAL/PLATELET
BASOS PCT: 0.6 % (ref 0.0–3.0)
Basophils Absolute: 0 10*3/uL (ref 0.0–0.1)
EOS PCT: 2.2 % (ref 0.0–5.0)
Eosinophils Absolute: 0.1 10*3/uL (ref 0.0–0.7)
HEMATOCRIT: 40.2 % (ref 36.0–46.0)
HEMOGLOBIN: 13.7 g/dL (ref 12.0–15.0)
LYMPHS PCT: 33.8 % (ref 12.0–46.0)
Lymphs Abs: 1.7 10*3/uL (ref 0.7–4.0)
MCHC: 34.1 g/dL (ref 30.0–36.0)
MCV: 87.3 fl (ref 78.0–100.0)
MONO ABS: 0.4 10*3/uL (ref 0.1–1.0)
Monocytes Relative: 7.9 % (ref 3.0–12.0)
Neutro Abs: 2.7 10*3/uL (ref 1.4–7.7)
Neutrophils Relative %: 55.5 % (ref 43.0–77.0)
Platelets: 242 10*3/uL (ref 150.0–400.0)
RBC: 4.6 Mil/uL (ref 3.87–5.11)
RDW: 13.4 % (ref 11.5–15.5)
WBC: 4.9 10*3/uL (ref 4.0–10.5)

## 2016-04-01 LAB — BASIC METABOLIC PANEL
BUN: 15 mg/dL (ref 6–23)
CALCIUM: 8.9 mg/dL (ref 8.4–10.5)
CO2: 30 mEq/L (ref 19–32)
CREATININE: 0.72 mg/dL (ref 0.40–1.20)
Chloride: 105 mEq/L (ref 96–112)
GFR: 88.05 mL/min (ref >60.00)
GLUCOSE: 94 mg/dL (ref 70–99)
Potassium: 4 mEq/L (ref 3.5–5.1)
Sodium: 140 mEq/L (ref 135–145)

## 2016-04-01 NOTE — Progress Notes (Signed)
Subjective:    Patient ID: Sheila Fuller, female    DOB: [birth year], 60 y.o.   MRN: [id number]  DOS:  04/01/2016 Type of visit - description : CPX Interval history: Feeling well in general   Review of Systems Constitutional: No fever. No chills. No unexplained wt changes. No unusual sweats  HEENT: No dental problems, no ear discharge, no facial swelling, no voice changes. No eye discharge, occ L eye slt red, no d/c   Respiratory: No wheezing , no  difficulty breathing. No cough , no mucus production  Cardiovascular: No CP, no leg swelling , no  Palpitations  GI: no nausea, no vomiting, no  abdominal pain.  Had diarrhea and some point, symptoms completely resolved w/ probiotics. Occasional anal dc, no new issue, happening since XRT years ago No blood in the stools. No dysphagia, no odynophagia    Endocrine: No polyphagia, no polyuria , no polydipsia  GU: No dysuria, gross hematuria, difficulty urinating. No urinary urgency, no frequency.  Musculoskeletal: No joint swellings or unusual aches or pains  Skin: No change in the color of the skin, palor , no  Rash  Allergic, immunologic: No environmental allergies , no  food allergies  Neurological: No dizziness no  syncope. No headaches. No diplopia, no slurred, no slurred speech, no motor deficits, no facial  Numbness  Hematological: No enlarged lymph nodes, no easy bruising , no unusual bleedings  Psychiatry: No suicidal ideas, no hallucinations, no beavior problems, no confusion.  No unusual/severe anxiety, no depression   Past Medical History  Diagnosis Date  . Allergic rhinitis   . Depression   . Hyperlipidemia   . Carcinoma in situ of perianal skin     +XRT & chemo 2000; no further f/u w/ specialist.   . Microhematuria 01/2006    saw Dr.Peterson, renal u/s: r pyelocaliectasis but CT was neg, cysto neg  . Lactose intolerance 10/04/2013  . Asthma     seasonally  . Shortness of breath     only with asthma flare up  .  Sleep apnea     rarely uses cpap  . Anxiety   . GERD (gastroesophageal reflux disease)     takes zantac maybe 2/week  . Arthritis   . Cancer [facility])     skin    Past Surgical History  Procedure Laterality Date  . Cervical cauterization    . Cesarean section    . Nasal septum surgery    . Ganglion cyst excision    . Anterior cervical decomp/discectomy fusion N/A 08/23/2014    Procedure: C5-6 Anterior Cervical Discectomy and Fusion, Allograft, Plate;  Surgeon: Marybelle Killings, MD;  Location: [facility];  Service: Orthopedics;  Laterality: N/A;    Social History   Social History  . Marital Status: Married    Spouse Name: N/A  . Number of Children: 1  . 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 Smoking status: Former Smoker -- 0.30 packs/day for 3 years    Types: Cigarettes  . Smokeless tobacco: Never Used     Comment: quit 04-2013 (Chantix)  . Alcohol Use: 1.8 oz/week    3 Standard drinks or equivalent per week  . Drug Use: No  . Sexual Activity: Not on file   Other Topics Concern  . Not on file   Social History Narrative   remarried 10-11, 1 child  Family History  Problem Relation Age of Onset  . Breast cancer Neg Hx   . Coronary artery disease Neg Hx   . Hypertension Neg Hx   . Stroke Neg Hx   . Sudden death Neg Hx   . Heart attack Neg Hx   . Colon cancer Other     aunt  . Lung cancer Father     smoker  . Diabetes Mother     late onset  . Hyperlipidemia Mother   . Cervical cancer Sister        Medication List       This list is accurate as of: 04/01/16 11:59 PM.  Always use your most recent med list.               albuterol 108 (90 Base) MCG/ACT inhaler  Commonly known as:  PROVENTIL HFA;VENTOLIN HFA  Inhale 2 puffs by mouth four times a day if needed.     cyclobenzaprine 5 MG tablet  Commonly known as:  FLEXERIL  Take 1 tablet (5 mg total) by mouth 2 (two) times daily  as needed for muscle spasms.     FLUoxetine 20 MG capsule  Commonly known as:  PROZAC  Take 1 capsule (20 mg total) by mouth daily.     fluticasone 50 MCG/ACT nasal spray  Commonly known as:  FLONASE  Place 2 sprays into both nostrils daily.     ibuprofen 800 MG tablet  Commonly known as:  ADVIL,MOTRIN  take 1 tablet by mouth every 6 to 8 hours if needed for pain     losartan 100 MG tablet  Commonly known as:  COZAAR  Take 1 tablet (100 mg total) by mouth daily.     naproxen 500 MG tablet  Commonly known as:  NAPROSYN  Take 1 tablet (500 mg total) by mouth every morning.     PAZEO 0.7 % Soln  Generic drug:  Olopatadine HCl           Objective:   Physical Exam BP 120/78 mmHg  Pulse 70  Temp(Src) 97 F (36.1 C) (Oral)  Ht 5\' 9"  (1.753 m)  Wt 187 lb 8 oz (85.049 kg)  BMI 27.68 kg/m2  SpO2 94%  General:   Well developed, well nourished . NAD.  Neck: No  thyromegaly  HEENT:  Normocephalic . Face symmetric, atraumatic Lungs:  CTA B Normal respiratory effort, no intercostal retractions, no accessory muscle use. Heart: RRR,  no murmur.  No pretibial edema bilaterally  Abdomen:  Not distended, soft, non-tender. No rebound or rigidity.   Skin: Exposed areas without rash. Not pale. Not jaundice Neurologic:  alert & oriented X3.  Speech normal, gait appropriate for age and unassisted Strength symmetric and appropriate for age.  Psych: Cognition and judgment appear intact.  Cooperative with normal attention span and concentration.  Behavior appropriate. No anxious or depressed appearing.    Assessment & Plan:   Assessment HTN Hyperlipidemia Anxiety, depression GERD Seasonal asthma Allergies OSA: Uses CPAP irregularly MSK: DJD, occasionally uses pain medication H/o Carcinoma in situ, perianal skin, XRT, chemotherapy 2000. Not further follow-ups with specialist H/o Microhematuria 2007, Dr. Terance Hart, renal US showed right pyelocaliectasis, CT was negative,  cystoscopy negative  PLAN: HTN controlled Overweight: reviewed role of diet, exercise and medications. She  is losing weight by simply cutting down on eating healthier. RTC 6 months

## 2016-04-01 NOTE — Progress Notes (Signed)
Pre visit review using our clinic review tool, if applicable. No additional management support is needed unless otherwise documented below in the visit note. 

## 2016-04-01 NOTE — Patient Instructions (Addendum)
GO TO THE LAB : Get the blood work     GO TO THE FRONT DESK Schedule your next appointment for a  Check up in 6 months  Consider Belviq, Qsymia , Saxenda, [employer]

## 2016-04-01 NOTE — Assessment & Plan Note (Addendum)
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 Former smoker, today she reports that for at least 56 or 20 years   Smoked 2 ppd, other  years she was a lighter smoker, + FH lung cancer: refer for screening

## 2016-04-02 NOTE — Assessment & Plan Note (Signed)
HTN controlled Overweight: reviewed role of diet, exercise and medications. She  is losing weight by simply cutting down on eating healthier. RTC 6 months

## 2016-05-15 ENCOUNTER — Ambulatory Visit (INDEPENDENT_AMBULATORY_CARE_PROVIDER_SITE_OTHER): Payer: BLUE CROSS/BLUE SHIELD | Admitting: Obstetrics & Gynecology

## 2016-05-15 ENCOUNTER — Encounter: Payer: Self-pay | Admitting: Obstetrics & Gynecology

## 2016-05-15 VITALS — Ht 68.5 in | Wt 188.0 lb

## 2016-05-15 DIAGNOSIS — Z1151 Encounter for screening for human papillomavirus (HPV): Secondary | ICD-10-CM | POA: Diagnosis not present

## 2016-05-15 DIAGNOSIS — Z01419 Encounter for gynecological examination (general) (routine) without abnormal findings: Secondary | ICD-10-CM | POA: Diagnosis not present

## 2016-05-15 DIAGNOSIS — Z124 Encounter for screening for malignant neoplasm of cervix: Secondary | ICD-10-CM

## 2016-05-15 NOTE — Progress Notes (Signed)
GYNECOLOGY CLINIC ANNUAL PREVENTATIVE CARE ENCOUNTER NOTE  Subjective:   Sheila Fuller is a 60 y.o. G68P1011 female here for a routine annual gynecologic exam.  Current complaints: none.   Denies abnormal vaginal bleeding, discharge, pelvic pain, significant problems with intercourse or other gynecologic concerns.    Gynecologic History No LMP recorded. Patient is postmenopausal. Contraception: post menopausal status Last Pap: 2013. Results were: normal Last mammogram: 07/2015. Results were: normal  Obstetric History OB History  Gravida Para Term Preterm AB SAB TAB Ectopic Multiple Living  2 1 1  1  1   1     # Outcome Date GA Lbr Len/2nd Weight Sex Delivery Anes PTL Lv  2 TAB           1 Term      [id number]-[id number]         Past Medical History  Diagnosis Date  . Allergic rhinitis   . Depression   . Hyperlipidemia   . Carcinoma in situ of perianal skin     +XRT & chemo 2000; no further f/u w/ specialist.   . Microhematuria 01/2006    saw Dr.Peterson, renal u/s: r pyelocaliectasis but CT was neg, cysto neg  . Lactose intolerance 10/04/2013  . Asthma     seasonally  . Shortness of breath     only with asthma flare up  . Sleep apnea     rarely uses cpap  . Anxiety   . GERD (gastroesophageal reflux disease)     takes zantac maybe 2/week  . Arthritis   . Cancer [facility])     skin    Past Surgical History  Procedure Laterality Date  . Cervical cauterization    . Cesarean section    . Nasal septum surgery    . Ganglion cyst excision    . Anterior cervical decomp/discectomy fusion N/A 08/23/2014    Procedure: C5-6 Anterior Cervical Discectomy and Fusion, Allograft, Plate;  Surgeon: Marybelle Killings, MD;  Location: [place];  Service: Orthopedics;  Laterality: N/A;    Current Outpatient Prescriptions on File Prior to Visit  Medication Sig Dispense Refill  . albuterol (PROVENTIL HFA;VENTOLIN HFA) 108 (90 Base) MCG/ACT inhaler Inhale 2 puffs by mouth four times a day if needed. 18  Inhaler 1  . cyclobenzaprine (FLEXERIL) 5 MG tablet Take 1 tablet (5 mg total) by mouth 2 (two) times daily as needed for muscle spasms. 60 tablet 2  . FLUoxetine (PROZAC) 20 MG capsule Take 1 capsule (20 mg total) by mouth daily. 30 capsule 6  . fluticasone (FLONASE) 50 MCG/ACT nasal spray Place 2 sprays into both nostrils daily. 16 g 6  . ibuprofen (ADVIL,MOTRIN) 800 MG tablet take 1 tablet by mouth every 6 to 8 hours if needed for pain  0  . losartan (COZAAR) 100 MG tablet Take 1 tablet (100 mg total) by mouth daily. 30 tablet 6  . naproxen (NAPROSYN) 500 MG tablet Take 1 tablet (500 mg total) by mouth every morning. 30 tablet 6  . PAZEO 0.7 % SOLN   0   No current facility-administered medications on file prior to visit.    Allergies  Allergen Reactions  . Statins     REACTION: myalgia  . Sulfonamide Derivatives Rash    Social History   Social History  . Marital Status: Married    Spouse Name: N/A  . Number of Children: 1  . 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 Smoking status: Former Smoker -- 0.30 packs/day for 3 years    Types: Cigarettes  . Smokeless tobacco: Never Used     Comment: quit 04-2013 (Chantix)  . Alcohol Use: 1.8 oz/week    3 Standard drinks or equivalent per week  . Drug Use: No  . Sexual Activity: Yes   Other Topics Concern  . Not on file   Social History Narrative   remarried 10-11, 1 child    Family History  Problem Relation Age of Onset  . Breast cancer Neg Hx   . Coronary artery disease Neg Hx   . Hypertension Neg Hx   . Stroke Neg Hx   . Sudden death Neg Hx   . Heart attack Neg Hx   . Colon cancer Other     aunt  . Lung cancer Father     smoker  . Cancer Father     lung- smoker  . Diabetes Mother     late onset  . Hyperlipidemia Mother   . Cancer Mother     metholosmia  . Cervical cancer Sister   . Cancer Sister 24    cervical  . Cancer  Maternal Aunt     rectal  . Cancer Paternal Uncle     lung- smoker    The following portions of the patient's history were reviewed and updated as appropriate: allergies, current medications, past family history, past medical history, past social history, past surgical history and problem list.  Review of Systems Pertinent items noted in HPI and remainder of comprehensive ROS otherwise negative.   Objective:  Ht 5' 8.5" (1.74 m)  Wt 188 lb (85.276 kg)  BMI 28.17 kg/m2 CONSTITUTIONAL: Well-developed, well-nourished female in no acute distress.  HENT:  Normocephalic, atraumatic, External right and left ear normal. Oropharynx is clear and moist EYES: Conjunctivae and EOM are normal. Pupils are equal, round, and reactive to light. No scleral icterus.  NECK: Normal range of motion, supple, no masses.  Normal thyroid.  SKIN: Skin is warm and dry. No rash noted. Not diaphoretic. No erythema. No pallor. NEUROLOGIC: Alert and oriented to person, place, and time. Normal reflexes, muscle tone coordination. No cranial nerve deficit noted. PSYCHIATRIC: Normal mood and affect. Normal behavior. Normal judgment and thought content. CARDIOVASCULAR: Normal heart rate noted, regular rhythm RESPIRATORY: Clear to auscultation bilaterally. Effort and breath sounds normal, no problems with respiration noted. BREASTS: Symmetric in size. No masses, skin changes, nipple drainage, or lymphadenopathy. ABDOMEN: Soft, normal bowel sounds, no distention noted.  No tenderness, rebound or guarding.  PELVIC: Normal appearing external genitalia; atrophic appearing vaginal mucosa and cervix with cervical stenosis present.  No abnormal discharge noted.  Pap smear obtained, unable to get endocervical sample.  Normal uterine size, no other palpable masses, no uterine or adnexal tenderness. MUSCULOSKELETAL: Normal range of motion. No tenderness.  No cyanosis, clubbing, or edema.  2+ distal pulses.   Assessment:  Annual  gynecologic examination with pap smear   Plan:  Will follow up results of pap smear and manage accordingly. Mammogramis up to date Routine preventative health maintenance measures emphasized. Please refer to After Visit Summary for other counseling recommendations.    Verita Schneiders, MD, [place] Attending Obstetrician & Gynecologist, [place] for [facility]

## 2016-05-15 NOTE — Patient Instructions (Signed)
Preventive Care for Adults, Female A healthy lifestyle and preventive care can promote health and wellness. Preventive health guidelines for women include the following key practices.  A routine yearly physical is a good way to check with your health care provider about your health and preventive screening. It is a chance to share any concerns and updates on your health and to receive a thorough exam.  Visit your dentist for a routine exam and preventive care every 6 months. Brush your teeth twice a day and floss once a day. Good oral hygiene prevents tooth decay and gum disease.  The frequency of eye exams is based on your age, health, family medical history, use of contact lenses, and other factors. Follow your health care provider's recommendations for frequency of eye exams.  Eat a healthy diet. Foods like vegetables, fruits, whole grains, low-fat dairy products, and lean protein foods contain the nutrients you need without too many calories. Decrease your intake of foods high in solid fats, added sugars, and salt. Eat the right amount of calories for you.Get information about a proper diet from your health care provider, if necessary.  Regular physical exercise is one of the most important things you can do for your health. Most adults should get at least 150 minutes of moderate-intensity exercise (any activity that increases your heart rate and causes you to sweat) each week. In addition, most adults need muscle-strengthening exercises on 2 or more days a week.  Maintain a healthy weight. The body mass index (BMI) is a screening tool to identify possible weight problems. It provides an estimate of body fat based on height and weight. Your health care provider can find your BMI and can help you achieve or maintain a healthy weight.For adults 20 years and older:  A BMI below 18.5 is considered underweight.  A BMI of 18.5 to 24.9 is normal.  A BMI of 25 to 29.9 is considered overweight.  A  BMI of 30 and above is considered obese.  Maintain normal blood lipids and cholesterol levels by exercising and minimizing your intake of saturated fat. Eat a balanced diet with plenty of fruit and vegetables. Blood tests for lipids and cholesterol should begin at age 45 and be repeated every 5 years. If your lipid or cholesterol levels are high, you are over 50, or you are at high risk for heart disease, you may need your cholesterol levels checked more frequently.Ongoing high lipid and cholesterol levels should be treated with medicines if diet and exercise are not working.  If you smoke, find out from your health care provider how to quit. If you do not use tobacco, do not start.  Lung cancer screening is recommended for adults aged 45-80 years who are at high risk for developing lung cancer because of a history of smoking. A yearly low-dose CT scan of the lungs is recommended for people who have at least a 30-pack-year history of smoking and are a current smoker or have quit within the past 15 years. A pack year of smoking is smoking an average of 1 pack of cigarettes a day for 1 year (for example: 1 pack a day for 30 years or 2 packs a day for 15 years). Yearly screening should continue until the smoker has stopped smoking for at least 15 years. Yearly screening should be stopped for people who develop a health problem that would prevent them from having lung cancer treatment.  If you are pregnant, do not drink alcohol. If you are  breastfeeding, be very cautious about drinking alcohol. If you are not pregnant and choose to drink alcohol, do not have more than 1 drink per day. One drink is considered to be 12 ounces (355 mL) of beer, 5 ounces (148 mL) of wine, or 1.5 ounces (44 mL) of liquor.  Avoid use of street drugs. Do not share needles with anyone. Ask for help if you need support or instructions about stopping the use of drugs.  High blood pressure causes heart disease and increases the risk  of stroke. Your blood pressure should be checked at least every 1 to 2 years. Ongoing high blood pressure should be treated with medicines if weight loss and exercise do not work.  If you are 55-79 years old, ask your health care provider if you should take aspirin to prevent strokes.  Diabetes screening is done by taking a blood sample to check your blood glucose level after you have not eaten for a certain period of time (fasting). If you are not overweight and you do not have risk factors for diabetes, you should be screened once every 3 years starting at age 45. If you are overweight or obese and you are 40-70 years of age, you should be screened for diabetes every year as part of your cardiovascular risk assessment.  Breast cancer screening is essential preventive care for women. You should practice "breast self-awareness." This means understanding the normal appearance and feel of your breasts and may include breast self-examination. Any changes detected, no matter how small, should be reported to a health care provider. Women in their 20s and 30s should have a clinical breast exam (CBE) by a health care provider as part of a regular health exam every 1 to 3 years. After age 40, women should have a CBE every year. Starting at age 40, women should consider having a mammogram (breast X-ray test) every year. Women who have a family history of breast cancer should talk to their health care provider about genetic screening. Women at a high risk of breast cancer should talk to their health care providers about having an MRI and a mammogram every year.  Breast cancer gene (BRCA)-related cancer risk assessment is recommended for women who have family members with BRCA-related cancers. BRCA-related cancers include breast, ovarian, tubal, and peritoneal cancers. Having family members with these cancers may be associated with an increased risk for harmful changes (mutations) in the breast cancer genes BRCA1 and  BRCA2. Results of the assessment will determine the need for genetic counseling and BRCA1 and BRCA2 testing.  Your health care provider may recommend that you be screened regularly for cancer of the pelvic organs (ovaries, uterus, and vagina). This screening involves a pelvic examination, including checking for microscopic changes to the surface of your cervix (Pap test). You may be encouraged to have this screening done every 3 years, beginning at age 21.  For women ages 30-65, health care providers may recommend pelvic exams and Pap testing every 3 years, or they may recommend the Pap and pelvic exam, combined with testing for human papilloma virus (HPV), every 5 years. Some types of HPV increase your risk of cervical cancer. Testing for HPV may also be done on women of any age with unclear Pap test results.  Other health care providers may not recommend any screening for nonpregnant women who are considered low risk for pelvic cancer and who do not have symptoms. Ask your health care provider if a screening pelvic exam is right for   you.  If you have had past treatment for cervical cancer or a condition that could lead to cancer, you need Pap tests and screening for cancer for at least 20 years after your treatment. If Pap tests have been discontinued, your risk factors (such as having a new sexual partner) need to be reassessed to determine if screening should resume. Some women have medical problems that increase the chance of getting cervical cancer. In these cases, your health care provider may recommend more frequent screening and Pap tests.  Colorectal cancer can be detected and often prevented. Most routine colorectal cancer screening begins at the age of 50 years and continues through age 75 years. However, your health care provider may recommend screening at an earlier age if you have risk factors for colon cancer. On a yearly basis, your health care provider may provide home test kits to check  for hidden blood in the stool. Use of a small camera at the end of a tube, to directly examine the colon (sigmoidoscopy or colonoscopy), can detect the earliest forms of colorectal cancer. Talk to your health care provider about this at age 50, when routine screening begins. Direct exam of the colon should be repeated every 5-10 years through age 75 years, unless early forms of precancerous polyps or small growths are found.  People who are at an increased risk for hepatitis B should be screened for this virus. You are considered at high risk for hepatitis B if:  You were born in a country where hepatitis B occurs often. Talk with your health care provider about which countries are considered high risk.  Your parents were born in a high-risk country and you have not received a shot to protect against hepatitis B (hepatitis B vaccine).  You have HIV or AIDS.  You use needles to inject street drugs.  You live with, or have sex with, someone who has hepatitis B.  You get hemodialysis treatment.  You take certain medicines for conditions like cancer, organ transplantation, and autoimmune conditions.  Hepatitis C blood testing is recommended for all people born from 1945 through 1965 and any individual with known risks for hepatitis C.  Practice safe sex. Use condoms and avoid high-risk sexual practices to reduce the spread of sexually transmitted infections (STIs). STIs include gonorrhea, chlamydia, syphilis, trichomonas, herpes, HPV, and human immunodeficiency virus (HIV). Herpes, HIV, and HPV are viral illnesses that have no cure. They can result in disability, cancer, and death.  You should be screened for sexually transmitted illnesses (STIs) including gonorrhea and chlamydia if:  You are sexually active and are younger than 24 years.  You are older than 24 years and your health care provider tells you that you are at risk for this type of infection.  Your sexual activity has changed  since you were last screened and you are at an increased risk for chlamydia or gonorrhea. Ask your health care provider if you are at risk.  If you are at risk of being infected with HIV, it is recommended that you take a prescription medicine daily to prevent HIV infection. This is called preexposure prophylaxis (PrEP). You are considered at risk if:  You are sexually active and do not regularly use condoms or know the HIV status of your partner(s).  You take drugs by injection.  You are sexually active with a partner who has HIV.  Talk with your health care provider about whether you are at high risk of being infected with HIV. If   you choose to begin PrEP, you should first be tested for HIV. You should then be tested every 3 months for as long as you are taking PrEP.  Osteoporosis is a disease in which the bones lose minerals and strength with aging. This can result in serious bone fractures or breaks. The risk of osteoporosis can be identified using a bone density scan. Women ages 67 years and over and women at risk for fractures or osteoporosis should discuss screening with their health care providers. Ask your health care provider whether you should take a calcium supplement or vitamin D to reduce the rate of osteoporosis.  Menopause can be associated with physical symptoms and risks. Hormone replacement therapy is available to decrease symptoms and risks. You should talk to your health care provider about whether hormone replacement therapy is right for you.  Use sunscreen. Apply sunscreen liberally and repeatedly throughout the day. You should seek shade when your shadow is shorter than you. Protect yourself by wearing long sleeves, pants, a wide-brimmed hat, and sunglasses year round, whenever you are outdoors.  Once a month, do a whole body skin exam, using a mirror to look at the skin on your back. Tell your health care provider of new moles, moles that have irregular borders, moles that  are larger than a pencil eraser, or moles that have changed in shape or color.  Stay current with required vaccines (immunizations).  Influenza vaccine. All adults should be immunized every year.  Tetanus, diphtheria, and acellular pertussis (Td, Tdap) vaccine. Pregnant women should receive 1 dose of Tdap vaccine during each pregnancy. The dose should be obtained regardless of the length of time since the last dose. Immunization is preferred during the 27th-36th week of gestation. An adult who has not previously received Tdap or who does not know her vaccine status should receive 1 dose of Tdap. This initial dose should be followed by tetanus and diphtheria toxoids (Td) booster doses every 10 years. Adults with an unknown or incomplete history of completing a 3-dose immunization series with Td-containing vaccines should begin or complete a primary immunization series including a Tdap dose. Adults should receive a Td booster every 10 years.  Varicella vaccine. An adult without evidence of immunity to varicella should receive 2 doses or a second dose if she has previously received 1 dose. Pregnant females who do not have evidence of immunity should receive the first dose after pregnancy. This first dose should be obtained before leaving the health care facility. The second dose should be obtained 4-8 weeks after the first dose.  Human papillomavirus (HPV) vaccine. Females aged 13-26 years who have not received the vaccine previously should obtain the 3-dose series. The vaccine is not recommended for use in pregnant females. However, pregnancy testing is not needed before receiving a dose. If a female is found to be pregnant after receiving a dose, no treatment is needed. In that case, the remaining doses should be delayed until after the pregnancy. Immunization is recommended for any person with an immunocompromised condition through the age of 61 years if she did not get any or all doses earlier. During the  3-dose series, the second dose should be obtained 4-8 weeks after the first dose. The third dose should be obtained 24 weeks after the first dose and 16 weeks after the second dose.  Zoster vaccine. One dose is recommended for adults aged 30 years or older unless certain conditions are present.  Measles, mumps, and rubella (MMR) vaccine. Adults born  before 1957 generally are considered immune to measles and mumps. Adults born in 1957 or later should have 1 or more doses of MMR vaccine unless there is a contraindication to the vaccine or there is laboratory evidence of immunity to each of the three diseases. A routine second dose of MMR vaccine should be obtained at least 28 days after the first dose for students attending postsecondary schools, health care workers, or international travelers. People who received inactivated measles vaccine or an unknown type of measles vaccine during 1963-1967 should receive 2 doses of MMR vaccine. People who received inactivated mumps vaccine or an unknown type of mumps vaccine before 1979 and are at high risk for mumps infection should consider immunization with 2 doses of MMR vaccine. For females of childbearing age, rubella immunity should be determined. If there is no evidence of immunity, females who are not pregnant should be vaccinated. If there is no evidence of immunity, females who are pregnant should delay immunization until after pregnancy. Unvaccinated health care workers born before 1957 who lack laboratory evidence of measles, mumps, or rubella immunity or laboratory confirmation of disease should consider measles and mumps immunization with 2 doses of MMR vaccine or rubella immunization with 1 dose of MMR vaccine.  Pneumococcal 13-valent conjugate (PCV13) vaccine. When indicated, a person who is uncertain of his immunization history and has no record of immunization should receive the PCV13 vaccine. All adults 65 years of age and older should receive this  vaccine. An adult aged 19 years or older who has certain medical conditions and has not been previously immunized should receive 1 dose of PCV13 vaccine. This PCV13 should be followed with a dose of pneumococcal polysaccharide (PPSV23) vaccine. Adults who are at high risk for pneumococcal disease should obtain the PPSV23 vaccine at least 8 weeks after the dose of PCV13 vaccine. Adults older than 59 years of age who have normal immune system function should obtain the PPSV23 vaccine dose at least 1 year after the dose of PCV13 vaccine.  Pneumococcal polysaccharide (PPSV23) vaccine. When PCV13 is also indicated, PCV13 should be obtained first. All adults aged 65 years and older should be immunized. An adult younger than age 65 years who has certain medical conditions should be immunized. Any person who resides in a nursing home or long-term care facility should be immunized. An adult smoker should be immunized. People with an immunocompromised condition and certain other conditions should receive both PCV13 and PPSV23 vaccines. People with human immunodeficiency virus (HIV) infection should be immunized as soon as possible after diagnosis. Immunization during chemotherapy or radiation therapy should be avoided. Routine use of PPSV23 vaccine is not recommended for American Indians, [place] Natives, or people younger than 65 years unless there are medical conditions that require PPSV23 vaccine. When indicated, people who have unknown immunization and have no record of immunization should receive PPSV23 vaccine. One-time revaccination 5 years after the first dose of PPSV23 is recommended for people aged 19-64 years who have chronic kidney failure, nephrotic syndrome, asplenia, or immunocompromised conditions. People who received 1-2 doses of PPSV23 before age 65 years should receive another dose of PPSV23 vaccine at age 65 years or later if at least 5 years have passed since the previous dose. Doses of PPSV23 are not  needed for people immunized with PPSV23 at or after age 65 years.  Meningococcal vaccine. Adults with asplenia or persistent complement component deficiencies should receive 2 doses of quadrivalent meningococcal conjugate (MenACWY-D) vaccine. The doses should be obtained   at least 2 months apart. Microbiologists working with certain meningococcal bacteria, [place] recruits, people at risk during an outbreak, and people who travel to or live in countries with a high rate of meningitis should be immunized. A first-year college student up through age 34 years who is living in a residence hall should receive a dose if she did not receive a dose on or after her 16th birthday. Adults who have certain high-risk conditions should receive one or more doses of vaccine.  Hepatitis A vaccine. Adults who wish to be protected from this disease, have certain high-risk conditions, work with hepatitis A-infected animals, work in hepatitis A research labs, or travel to or work in countries with a high rate of hepatitis A should be immunized. Adults who were previously unvaccinated and who anticipate close contact with an international adoptee during the first 60 days after arrival in the [place] from a country with a high rate of hepatitis A should be immunized.  Hepatitis B vaccine. Adults who wish to be protected from this disease, have certain high-risk conditions, may be exposed to blood or other infectious body fluids, are household contacts or sex partners of hepatitis B positive people, are clients or workers in certain care facilities, or travel to or work in countries with a high rate of hepatitis B should be immunized.  Haemophilus influenzae type b (Hib) vaccine. A previously unvaccinated person with asplenia or sickle cell disease or having a scheduled splenectomy should receive 1 dose of Hib vaccine. Regardless of previous immunization, a recipient of a hematopoietic stem cell transplant should receive a  3-dose series 6-12 months after her successful transplant. Hib vaccine is not recommended for adults with HIV infection. Preventive Services / Frequency Ages 35 to 4 years  Blood pressure check.** / Every 3-5 years.  Lipid and cholesterol check.** / Every 5 years beginning at age 60.  Clinical breast exam.** / Every 3 years for women in their 71s and 10s.  BRCA-related cancer risk assessment.** / For women who have family members with a BRCA-related cancer (breast, ovarian, tubal, or peritoneal cancers).  Pap test.** / Every 2 years from ages 76 through 26. Every 3 years starting at age 61 through age 76 or 93 with a history of 3 consecutive normal Pap tests.  HPV screening.** / Every 3 years from ages 37 through ages 60 to 51 with a history of 3 consecutive normal Pap tests.  Hepatitis C blood test.** / For any individual with known risks for hepatitis C.  Skin self-exam. / Monthly.  Influenza vaccine. / Every year.  Tetanus, diphtheria, and acellular pertussis (Tdap, Td) vaccine.** / Consult your health care provider. Pregnant women should receive 1 dose of Tdap vaccine during each pregnancy. 1 dose of Td every 10 years.  Varicella vaccine.** / Consult your health care provider. Pregnant females who do not have evidence of immunity should receive the first dose after pregnancy.  HPV vaccine. / 3 doses over 6 months, if 93 and younger. The vaccine is not recommended for use in pregnant females. However, pregnancy testing is not needed before receiving a dose.  Measles, mumps, rubella (MMR) vaccine.** / You need at least 1 dose of MMR if you were born in 1957 or later. You may also need a 2nd dose. For females of childbearing age, rubella immunity should be determined. If there is no evidence of immunity, females who are not pregnant should be vaccinated. If there is no evidence of immunity, females who are  pregnant should delay immunization until after pregnancy.  Pneumococcal  13-valent conjugate (PCV13) vaccine.** / Consult your health care provider.  Pneumococcal polysaccharide (PPSV23) vaccine.** / 1 to 2 doses if you smoke cigarettes or if you have certain conditions.  Meningococcal vaccine.** / 1 dose if you are age 68 to 8 years and a Market researcher living in a residence hall, or have one of several medical conditions, you need to get vaccinated against meningococcal disease. You may also need additional booster doses.  Hepatitis A vaccine.** / Consult your health care provider.  Hepatitis B vaccine.** / Consult your health care provider.  Haemophilus influenzae type b (Hib) vaccine.** / Consult your health care provider. Ages 7 to 53 years  Blood pressure check.** / Every year.  Lipid and cholesterol check.** / Every 5 years beginning at age 25 years.  Lung cancer screening. / Every year if you are aged 11-80 years and have a 30-pack-year history of smoking and currently smoke or have quit within the past 15 years. Yearly screening is stopped once you have quit smoking for at least 15 years or develop a health problem that would prevent you from having lung cancer treatment.  Clinical breast exam.** / Every year after age 48 years.  BRCA-related cancer risk assessment.** / For women who have family members with a BRCA-related cancer (breast, ovarian, tubal, or peritoneal cancers).  Mammogram.** / Every year beginning at age 41 years and continuing for as long as you are in good health. Consult with your health care provider.  Pap test.** / Every 3 years starting at age 65 years through age 37 or 70 years with a history of 3 consecutive normal Pap tests.  HPV screening.** / Every 3 years from ages 72 years through ages 60 to 40 years with a history of 3 consecutive normal Pap tests.  Fecal occult blood test (FOBT) of stool. / Every year beginning at age 21 years and continuing until age 5 years. You may not need to do this test if you get  a colonoscopy every 10 years.  Flexible sigmoidoscopy or colonoscopy.** / Every 5 years for a flexible sigmoidoscopy or every 10 years for a colonoscopy beginning at age 35 years and continuing until age 48 years.  Hepatitis C blood test.** / For all people born from [birth year] through 1965 and any individual with known risks for hepatitis C.  Skin self-exam. / Monthly.  Influenza vaccine. / Every year.  Tetanus, diphtheria, and acellular pertussis (Tdap/Td) vaccine.** / Consult your health care provider. Pregnant women should receive 1 dose of Tdap vaccine during each pregnancy. 1 dose of Td every 10 years.  Varicella vaccine.** / Consult your health care provider. Pregnant females who do not have evidence of immunity should receive the first dose after pregnancy.  Zoster vaccine.** / 1 dose for adults aged 30 years or older.  Measles, mumps, rubella (MMR) vaccine.** / You need at least 1 dose of MMR if you were born in 1957 or later. You may also need a second dose. For females of childbearing age, rubella immunity should be determined. If there is no evidence of immunity, females who are not pregnant should be vaccinated. If there is no evidence of immunity, females who are pregnant should delay immunization until after pregnancy.  Pneumococcal 13-valent conjugate (PCV13) vaccine.** / Consult your health care provider.  Pneumococcal polysaccharide (PPSV23) vaccine.** / 1 to 2 doses if you smoke cigarettes or if you have certain conditions.  Meningococcal vaccine.** /  Consult your health care provider.  Hepatitis A vaccine.** / Consult your health care provider.  Hepatitis B vaccine.** / Consult your health care provider.  Haemophilus influenzae type b (Hib) vaccine.** / Consult your health care provider. Ages 64 years and over  Blood pressure check.** / Every year.  Lipid and cholesterol check.** / Every 5 years beginning at age 23 years.  Lung cancer screening. / Every year if you  are aged 16-80 years and have a 30-pack-year history of smoking and currently smoke or have quit within the past 15 years. Yearly screening is stopped once you have quit smoking for at least 15 years or develop a health problem that would prevent you from having lung cancer treatment.  Clinical breast exam.** / Every year after age 74 years.  BRCA-related cancer risk assessment.** / For women who have family members with a BRCA-related cancer (breast, ovarian, tubal, or peritoneal cancers).  Mammogram.** / Every year beginning at age 44 years and continuing for as long as you are in good health. Consult with your health care provider.  Pap test.** / Every 3 years starting at age 58 years through age 22 or 39 years with 3 consecutive normal Pap tests. Testing can be stopped between 65 and 70 years with 3 consecutive normal Pap tests and no abnormal Pap or HPV tests in the past 10 years.  HPV screening.** / Every 3 years from ages 64 years through ages 70 or 61 years with a history of 3 consecutive normal Pap tests. Testing can be stopped between 65 and 70 years with 3 consecutive normal Pap tests and no abnormal Pap or HPV tests in the past 10 years.  Fecal occult blood test (FOBT) of stool. / Every year beginning at age 40 years and continuing until age 27 years. You may not need to do this test if you get a colonoscopy every 10 years.  Flexible sigmoidoscopy or colonoscopy.** / Every 5 years for a flexible sigmoidoscopy or every 10 years for a colonoscopy beginning at age 7 years and continuing until age 32 years.  Hepatitis C blood test.** / For all people born from [birth year] through 1965 and any individual with known risks for hepatitis C.  Osteoporosis screening.** / A one-time screening for women ages 30 years and over and women at risk for fractures or osteoporosis.  Skin self-exam. / Monthly.  Influenza vaccine. / Every year.  Tetanus, diphtheria, and acellular pertussis (Tdap/Td)  vaccine.** / 1 dose of Td every 10 years.  Varicella vaccine.** / Consult your health care provider.  Zoster vaccine.** / 1 dose for adults aged 35 years or older.  Pneumococcal 13-valent conjugate (PCV13) vaccine.** / Consult your health care provider.  Pneumococcal polysaccharide (PPSV23) vaccine.** / 1 dose for all adults aged 46 years and older.  Meningococcal vaccine.** / Consult your health care provider.  Hepatitis A vaccine.** / Consult your health care provider.  Hepatitis B vaccine.** / Consult your health care provider.  Haemophilus influenzae type b (Hib) vaccine.** / Consult your health care provider. ** Family history and personal history of risk and conditions may change your health care provider's recommendations.   This information is not intended to replace advice given to you by your health care provider. Make sure you discuss any questions you have with your health care provider.   Document Released: 12/09/2001 Document Revised: 11/03/2014 Document Reviewed: 03/10/2011 Elsevier Interactive Patient Education [employer].

## 2016-05-19 ENCOUNTER — Encounter: Payer: Self-pay | Admitting: Gastroenterology

## 2016-05-20 LAB — CYTOLOGY - PAP

## 2016-05-21 ENCOUNTER — Encounter: Payer: Self-pay | Admitting: Gastroenterology

## 2016-05-25 ENCOUNTER — Other Ambulatory Visit: Payer: Self-pay | Admitting: Internal Medicine

## 2016-05-26 ENCOUNTER — Encounter: Payer: Self-pay | Admitting: Internal Medicine

## 2016-05-27 ENCOUNTER — Other Ambulatory Visit: Payer: Self-pay | Admitting: Internal Medicine

## 2016-05-27 ENCOUNTER — Telehealth: Payer: Self-pay | Admitting: Internal Medicine

## 2016-05-27 NOTE — Telephone Encounter (Addendum)
See patient's message, request medication for weight loss. She is doing better with diet and exercise but has barely lost weight. Patient BMI is 27.6, she has high blood pressure and dyslipidemia. Based on that she does qualify for pharmacological treatment. She takes Prozac which has potential interaction with some other medications available to her. Based on that will prescribe Saxenda 0.6 mg qd x 1 week, then 1.2 mg qd  RTC 6 weeks. Please arrange

## 2016-05-28 ENCOUNTER — Ambulatory Visit: Payer: BLUE CROSS/BLUE SHIELD | Admitting: Adult Health

## 2016-05-28 NOTE — Telephone Encounter (Signed)
Spoke w/ Sharen Heck, Saxenda drug representative [phone]) she will be coming by tomorrow (05/29/2016) afternoon to instruct on how to administer medication and w/ medication samples.

## 2016-05-29 MED ORDER — LIRAGLUTIDE -WEIGHT MANAGEMENT 18 MG/3ML ~~LOC~~ SOPN: 3.0000 mg | PEN_INJECTOR | Freq: Every day | SUBCUTANEOUS | 4 refills | Status: DC

## 2016-05-29 MED ORDER — INSULIN PEN NEEDLE 32G X 6 MM MISC: 5 refills | Status: DC

## 2016-05-29 NOTE — Telephone Encounter (Signed)
Rx and needles sent to [facility]. Saxenda PA initiated via Covermymeds, KEY: [id number]. Awaiting determination. Spoke w/ Pt, informed her that Dr. Larose Kells has sent medication to pharmacy. PA required and in process. Nurse visit scheduled for Monday, 06/02/2016, at 0800 to instruct Pt on use.

## 2016-05-29 NOTE — Telephone Encounter (Signed)
PA approved, case number [id number]. Approved through 09/26/2016.

## 2016-06-02 ENCOUNTER — Ambulatory Visit (INDEPENDENT_AMBULATORY_CARE_PROVIDER_SITE_OTHER): Payer: BLUE CROSS/BLUE SHIELD | Admitting: Internal Medicine

## 2016-06-02 VITALS — Wt 190.1 lb

## 2016-06-02 DIAGNOSIS — E669 Obesity, unspecified: Secondary | ICD-10-CM | POA: Insufficient documentation

## 2016-06-02 NOTE — Progress Notes (Signed)
[place] teaching, sample given to Pt. Pt able to accurately administer medication and all questions answered to the best of my ability. 6 week follow-up scheduled 07/21/2016 at 0930. Instructed Pt to call if questions or concerns.   Agree JP

## 2016-06-02 NOTE — Progress Notes (Signed)
Pre visit review using our clinic review tool, if applicable. No additional management support is needed unless otherwise documented below in the visit note. 

## 2016-06-10 NOTE — Telephone Encounter (Signed)
Received PA approval letter. Sent for scanning.

## 2016-06-24 ENCOUNTER — Ambulatory Visit (INDEPENDENT_AMBULATORY_CARE_PROVIDER_SITE_OTHER): Payer: BLUE CROSS/BLUE SHIELD | Admitting: Adult Health

## 2016-06-24 ENCOUNTER — Encounter: Payer: Self-pay | Admitting: Adult Health

## 2016-06-24 DIAGNOSIS — G4733 Obstructive sleep apnea (adult) (pediatric): Secondary | ICD-10-CM | POA: Diagnosis not present

## 2016-06-24 NOTE — Assessment & Plan Note (Signed)
Moderate obstructive sleep apnea with poor C Pap usage. Patient education was provided. C Pap download was requested Try to adjust pressures if needed to help with C Pap compliance  Plan  Patient Instructions  Try to wear C Pap each night Does wear for at least 4-6 hours Continue to work on weight loss. C Pap download. Follow Dr. Elsworth Soho in 1 year and as needed

## 2016-06-24 NOTE — Addendum Note (Signed)
Addended by: Osa Craver on: 06/24/2016 04:37 PM   Modules accepted: Orders

## 2016-06-24 NOTE — Progress Notes (Signed)
Subjective:    Patient ID: Sheila Fuller, female    DOB: 15-Oct-1956, 60 y.o.   MRN: [id number]  HPI 60 year old female followed for moderate obstructive sleep apnea  TEST  11/2010 - PSG 175 lbs - mod AHI 26/h  06/24/2016 follow-up obstructive sleep apnea Patient returns for six-month follow-up Patient says that she's not been wearing her C Pap on a consistent basis. We talked about obstructive sleep apnea and need for wearing each night. Patient education was provided. She has nasal pillows . Feels these are better for her . Does feel they leak and wake her up  Says she feels tired after lunch but does not feel that is is that bad.  No download is available .     Past Medical History:  Diagnosis Date  . Allergic rhinitis   . Anxiety   . Arthritis   . Asthma    seasonally  . Cancer ([place])    skin  . 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   . Depression   . GERD (gastroesophageal reflux disease)    takes zantac maybe 2/week  . Hyperlipidemia   . Lactose intolerance 10/04/2013  . Microhematuria 01/2006   saw Dr.Peterson, renal u/s: r pyelocaliectasis but CT was neg, cysto neg  . Shortness of breath    only with asthma flare up  . Sleep apnea    rarely uses cpap   Current Outpatient Prescriptions on File Prior to Visit  Medication Sig Dispense Refill  . albuterol (PROVENTIL HFA;VENTOLIN HFA) 108 (90 Base) MCG/ACT inhaler Inhale 2 puffs by mouth four times a day if needed. 18 Inhaler 1  . cyclobenzaprine (FLEXERIL) 5 MG tablet Take 1 tablet (5 mg total) by mouth 2 (two) times daily as needed for muscle spasms. 60 tablet 2  . FLUoxetine (PROZAC) 20 MG capsule Take 1 capsule (20 mg total) by mouth daily. 30 capsule 6  . fluticasone (FLONASE) 50 MCG/ACT nasal spray Place 2 sprays into both nostrils daily. 16 g 5  . ibuprofen (ADVIL,MOTRIN) 800 MG tablet take 1 tablet by mouth every 6 to 8 hours if needed for pain  0  . Insulin Pen  Needle 32G X 6 MM MISC To use w/ Saxenda 1 each 5  . Liraglutide -Weight Management (SAXENDA) 18 MG/3ML SOPN Inject 3 mg into the skin daily. Use dosing guidelines as given by MD. 5 pen 4  . losartan (COZAAR) 100 MG tablet Take 1 tablet (100 mg total) by mouth daily. 30 tablet 5  . naproxen (NAPROSYN) 500 MG tablet Take 1 tablet (500 mg total) by mouth every morning. 30 tablet 6  . PAZEO 0.7 % SOLN   0  . desloratadine (CLARINEX) 5 MG tablet      No current facility-administered medications on file prior to visit.       Review of Systems    Constitutional:   No  weight loss, night sweats,  Fevers, chills, fatigue, or  lassitude.  HEENT:   No headaches,  Difficulty swallowing,  Tooth/dental problems, or  Sore throat,                No sneezing, itching, ear ache, nasal congestion, post nasal drip,   CV:  No chest pain,  Orthopnea, PND, swelling in lower extremities, anasarca, dizziness, palpitations, syncope.   GI  No heartburn, indigestion, abdominal pain, nausea, vomiting, diarrhea, change in bowel habits, loss of appetite, bloody  stools.   Resp: No shortness of breath with exertion or at rest.  No excess mucus, no productive cough,  No non-productive cough,  No coughing up of blood.  No change in color of mucus.  No wheezing.  No chest wall deformity  Skin: no rash or lesions.  GU: no dysuria, change in color of urine, no urgency or frequency.  No flank pain, no hematuria   MS:  No joint pain or swelling.  No decreased range of motion.  No back pain.  Psych:  No change in mood or affect. No depression or anxiety.  No memory loss.      Objective:   Physical Exam Vitals:   06/24/16 1600  BP: 132/88  Pulse: 73  Temp: 98 F (36.7 C)  TempSrc: Oral  SpO2: 96%  Weight: 184 lb (83.5 kg)  Height: 5\' 8"  (1.727 m)   GEN: A/Ox3; pleasant , NAD, well nourished    HEENT:  [place]/AT,  EACs-clear, TMs-wnl, NOSE-clear, THROAT-clear, no lesions, no postnasal drip or exudate noted.  Class 2 MP airway   NECK:  Supple w/ fair ROM; no JVD; normal carotid impulses w/o bruits; no thyromegaly or nodules palpated; no lymphadenopathy.    RESP  Clear  P & A; w/o, wheezes/ rales/ or rhonchi. no accessory muscle use, no dullness to percussion  CARD:  RRR, no m/r/g  , no peripheral edema, pulses intact, no cyanosis or clubbing.  GI:   Soft & nt; nml bowel sounds; no organomegaly or masses detected.   Musco: Warm bil, no deformities or joint swelling noted.   Neuro: alert, no focal deficits noted.    Skin: Warm, no lesions or rashes  Jerrod Damiano NP-C  [facility]  06/24/2016        Assessment & Plan:

## 2016-06-24 NOTE — Patient Instructions (Signed)
Try to wear C Pap each night Does wear for at least 4-6 hours Continue to work on weight loss. C Pap download. Follow Dr. Elsworth Soho in 1 year and as needed

## 2016-06-26 NOTE — Progress Notes (Signed)
Reviewed & agree with plan  

## 2016-07-15 ENCOUNTER — Encounter: Payer: BLUE CROSS/BLUE SHIELD | Admitting: Gastroenterology

## 2016-07-21 ENCOUNTER — Ambulatory Visit (INDEPENDENT_AMBULATORY_CARE_PROVIDER_SITE_OTHER): Payer: BLUE CROSS/BLUE SHIELD | Admitting: Internal Medicine

## 2016-07-21 ENCOUNTER — Encounter: Payer: Self-pay | Admitting: Internal Medicine

## 2016-07-21 VITALS — BP 126/78 | HR 76 | Temp 98.3°F | Resp 14 | Ht 68.0 in | Wt 181.4 lb

## 2016-07-21 DIAGNOSIS — E669 Obesity, unspecified: Secondary | ICD-10-CM | POA: Diagnosis not present

## 2016-07-21 DIAGNOSIS — Z23 Encounter for immunization: Secondary | ICD-10-CM | POA: Diagnosis not present

## 2016-07-21 NOTE — Progress Notes (Signed)
Pre visit review using our clinic review tool, if applicable. No additional management support is needed unless otherwise documented below in the visit note. 

## 2016-07-21 NOTE — Patient Instructions (Signed)
Next visit in 2-3 months, fasting

## 2016-07-21 NOTE — Progress Notes (Signed)
Subjective:    Patient ID: Sheila Fuller, female    DOB: 09/30/1956, 60 y.o.   MRN: [id number]  DOS:  07/21/2016 Type of visit - description : Follow-up Interval history: Started Saxenda , doing better with diet, cutting down on carbohydrates, increasing his salad intake, trying to eat healthy proteins. She remains active.  Wt Readings from Last 3 Encounters:  07/21/16 181 lb 6 oz (82.3 kg)  06/24/16 184 lb (83.5 kg)  06/02/16 190 lb 2 oz (86.2 kg)     Review of Systems denies nausea, vomiting, diarrhea. Feeling great in general,  had some abdominal bloating  and that has resolved  Past Medical History:  Diagnosis Date  . Allergic rhinitis   . Anxiety   . Arthritis   . Asthma    seasonally  . Cancer ([place])    skin  . 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   . Depression   . GERD (gastroesophageal reflux disease)    takes zantac maybe 2/week  . Hyperlipidemia   . Lactose intolerance 10/04/2013  . Microhematuria 01/2006   saw Dr.Peterson, renal u/s: r pyelocaliectasis but CT was neg, cysto neg  . Shortness of breath    only with asthma flare up  . Sleep apnea    rarely uses cpap    Past Surgical History:  Procedure Laterality Date  . ANTERIOR CERVICAL DECOMP/DISCECTOMY FUSION N/A 08/23/2014   Procedure: C5-6 Anterior Cervical Discectomy and Fusion, Allograft, Plate;  Surgeon: Marybelle Killings, MD;  Location: [facility];  Service: Orthopedics;  Laterality: N/A;  . cervical cauterization    . CESAREAN SECTION    . GANGLION CYST EXCISION    . NASAL SEPTUM SURGERY      Social History   Social History  . Marital status: Married    Spouse name: N/A  . Number of children: 1  . 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 Smoking status: Former Smoker    Packs/day: 0.30    Years: 3.00    Types: Cigarettes  . Smokeless tobacco: Never Used   Comment: quit 04-2013 ([facility])  . Alcohol use 1.8 oz/week    3 Standard drinks or equivalent per week  . Drug use: No  . Sexual activity: Yes   Other Topics Concern  . Not on file   Social History Narrative   remarried 10-11, 1 child        Medication List       Accurate as of 07/21/16 10:17 AM. Always use your most recent med list.          albuterol 108 (90 Base) MCG/ACT inhaler Commonly known as:  PROVENTIL HFA;VENTOLIN HFA Inhale 2 puffs by mouth four times a day if needed.   cyclobenzaprine 5 MG tablet Commonly known as:  FLEXERIL Take 1 tablet (5 mg total) by mouth 2 (two) times daily as needed for muscle spasms.   desloratadine 5 MG tablet Commonly known as:  CLARINEX   FLUoxetine 20 MG capsule Commonly known as:  PROZAC Take 1 capsule (20 mg total) by mouth daily.   fluticasone 50 MCG/ACT nasal spray Commonly known as:  FLONASE Place 2 sprays into both nostrils daily.   ibuprofen 800 MG tablet Commonly known as:  ADVIL,MOTRIN take 1 tablet by mouth every 6 to 8 hours if needed for pain  Insulin Pen Needle 32G X 6 MM Misc To use w/ Saxenda   Liraglutide -Weight Management 18 MG/3ML Sopn Commonly known as:  SAXENDA Inject 3 mg into the skin daily. Use dosing guidelines as given by MD.   losartan 100 MG tablet Commonly known as:  COZAAR Take 1 tablet (100 mg total) by mouth daily.   naproxen 500 MG tablet Commonly known as:  NAPROSYN Take 1 tablet (500 mg total) by mouth every morning.   PAZEO 0.7 % Soln Generic drug:  Olopatadine HCl          Objective:   Physical Exam BP 126/78 (BP Location: Left Arm, Patient Position: Sitting, Cuff Size: Normal)   Pulse 76   Temp 98.3 F (36.8 C) (Oral)   Resp 14   Ht 5\' 8"  (1.727 m)   Wt 181 lb 6 oz (82.3 kg)   SpO2 98%   BMI 27.58 kg/m  General:   Well developed, well nourished . NAD.  HEENT:  Normocephalic . Face symmetric, atraumatic Skin: Not pale. Not jaundice Neurologic:  alert &  oriented X3.  Speech normal, gait appropriate for age and unassisted Psych--  Cognition and judgment appear intact.  Cooperative with normal attention span and concentration.  Behavior appropriate. No anxious or depressed appearing.      Assessment & Plan:  Assessment HTN Hyperlipidemia Anxiety, depression GERD Seasonal asthma Allergies OSA: Uses CPAP irregularly MSK: DJD, occasionally uses pain medication H/o Carcinoma in situ, perianal skin, XRT, chemotherapy 2000. Not further follow-ups with specialist H/o Microhematuria 2007, Dr. Terance Hart, renal US showed right pyelocaliectasis, CT was negative, cystoscopy negative  PLAN: Obesity : Overweight patient with HTN, hyperlipidemia and OSA started saxenda on  06/02/2016. Doing better with diet, weight decreased from 190 pounds to 181, no apparent side effects. Rec to continue developing healthy habits, try to document calories intake by counting them. Refill as needed, if she plateaus consider increase saxenda  from 2.4 mg to a full dose. Flu shot today RTC 2-3 months

## 2016-07-21 NOTE — Assessment & Plan Note (Signed)
Obesity : Overweight patient with HTN, hyperlipidemia and OSA started saxenda on  06/02/2016. Doing better with diet, weight decreased from 190 pounds to 181, no apparent side effects. Rec to continue developing healthy habits, try to document calories intake by counting them. Refill as needed, if she plateaus consider increase saxenda  from 2.4 mg to a full dose. Flu shot today RTC 2-3 months

## 2016-08-15 ENCOUNTER — Ambulatory Visit (INDEPENDENT_AMBULATORY_CARE_PROVIDER_SITE_OTHER): Payer: BLUE CROSS/BLUE SHIELD | Admitting: Internal Medicine

## 2016-08-15 ENCOUNTER — Encounter: Payer: Self-pay | Admitting: Internal Medicine

## 2016-08-15 VITALS — BP 132/78 | HR 74 | Temp 98.2°F | Resp 14 | Ht 68.0 in | Wt 177.5 lb

## 2016-08-15 DIAGNOSIS — R1030 Lower abdominal pain, unspecified: Secondary | ICD-10-CM

## 2016-08-15 DIAGNOSIS — Z09 Encounter for follow-up examination after completed treatment for conditions other than malignant neoplasm: Secondary | ICD-10-CM | POA: Diagnosis not present

## 2016-08-15 DIAGNOSIS — E6609 Other obesity due to excess calories: Secondary | ICD-10-CM

## 2016-08-15 LAB — CBC WITH DIFFERENTIAL/PLATELET
Basophils Absolute: 0 10*3/uL (ref 0.0–0.1)
Basophils Relative: 0.4 % (ref 0.0–3.0)
EOS PCT: 0.7 % (ref 0.0–5.0)
Eosinophils Absolute: 0 10*3/uL (ref 0.0–0.7)
HEMATOCRIT: 42.2 % (ref 36.0–46.0)
Hemoglobin: 14.7 g/dL (ref 12.0–15.0)
LYMPHS ABS: 1.8 10*3/uL (ref 0.7–4.0)
Lymphocytes Relative: 26.2 % (ref 12.0–46.0)
MCHC: 34.8 g/dL (ref 30.0–36.0)
MCV: 86.4 fl (ref 78.0–100.0)
MONOS PCT: 6.1 % (ref 3.0–12.0)
Monocytes Absolute: 0.4 10*3/uL (ref 0.1–1.0)
NEUTROS ABS: 4.5 10*3/uL (ref 1.4–7.7)
NEUTROS PCT: 66.6 % (ref 43.0–77.0)
PLATELETS: 271 10*3/uL (ref 150.0–400.0)
RBC: 4.88 Mil/uL (ref 3.87–5.11)
RDW: 13.1 % (ref 11.5–15.5)
WBC: 6.7 10*3/uL (ref 4.0–10.5)

## 2016-08-15 LAB — URINALYSIS, ROUTINE W REFLEX MICROSCOPIC
Bilirubin Urine: NEGATIVE
KETONES UR: NEGATIVE
Leukocytes, UA: NEGATIVE
NITRITE: NEGATIVE
Total Protein, Urine: NEGATIVE
UROBILINOGEN UA: 0.2 (ref 0.0–1.0)
Urine Glucose: NEGATIVE
pH: 7 (ref 5.0–8.0)

## 2016-08-15 LAB — COMPREHENSIVE METABOLIC PANEL
ALT: 14 U/L (ref 0–35)
AST: 16 U/L (ref 0–37)
Albumin: 4.5 g/dL (ref 3.5–5.2)
Alkaline Phosphatase: 69 U/L (ref 39–117)
BUN: 12 mg/dL (ref 6–23)
CHLORIDE: 103 meq/L (ref 96–112)
CO2: 31 meq/L (ref 19–32)
Calcium: 9.6 mg/dL (ref 8.4–10.5)
Creatinine, Ser: 0.76 mg/dL (ref 0.40–1.20)
GFR: 82.62 mL/min (ref >60.00)
GLUCOSE: 93 mg/dL (ref 70–99)
POTASSIUM: 3.9 meq/L (ref 3.5–5.1)
Sodium: 139 mEq/L (ref 135–145)
Total Bilirubin: 0.6 mg/dL (ref 0.2–1.2)
Total Protein: 7.6 g/dL (ref 6.0–8.3)

## 2016-08-15 LAB — LIPASE: Lipase: 28 U/L (ref 11.0–59.0)

## 2016-08-15 LAB — AMYLASE: Amylase: 40 U/L (ref 27–131)

## 2016-08-15 NOTE — Assessment & Plan Note (Signed)
Abdominal pain: x 2- 3 days ; no red flag symptoms, she is taking Saxendsa for the last few weeks  (pancreatitis is a potential  s/e). She also has a history of UTIs. Plan: Hold Saxenda, labs (CMP, CBC, amylase, lipase, UA urine culture). Call if not gradually improving, call if severe symptoms. See AVS Obesity: Started Saxenda 06-02-2016, initial weight 190, weight today 177. Will hold medications for 4 weeks due to abdominal pain, encouraged to continue practicing the better diet habits she has developed. Due for a colonoscopy, states she already contacted GI.

## 2016-08-15 NOTE — Patient Instructions (Signed)
GO TO THE LAB : Get the blood work      Follow-up a bland diet for the next few days  Hold Saxenda  for 4 weeks  Call if not gradually improving  Call if severe symptoms, fever, chills, blood  in the stools or not better in the next 10 days

## 2016-08-15 NOTE — Progress Notes (Signed)
Pre visit review using our clinic review tool, if applicable. No additional management support is needed unless otherwise documented below in the visit note. 

## 2016-08-15 NOTE — Progress Notes (Signed)
Subjective:    Patient ID: Sheila Fuller, female    DOB: 1956-09-12, 60 y.o.   MRN: [id number]  DOS:  08/15/2016 Type of visit - description : Acute visit Interval history: Symptoms started 2 or 3 days ago with diffuse mid abdominal pain with some radiation to the back. Pain is not severe but persistent, usually starts after she eats and diminish after a bowel movement. She started [facility] few weeks ago, good compliance, has changed her diet habits and she continue to lose weight.  Wt Readings from Last 3 Encounters:  08/15/16 177 lb 8 oz (80.5 kg)  07/21/16 181 lb 6 oz (82.3 kg)  06/24/16 184 lb (83.5 kg)     Review of Systems Some back pain. Denies fever chills No nausea, vomiting, diarrhea. From time to time she sees blood in the stools associated with history of hemorrhoids, no unusual bleeding lately. Also she has mucus coming from time to time. Not a new issue. No dysuria, gross hematuria difficulty urinating  Past Medical History:  Diagnosis Date  . Allergic rhinitis   . Anxiety   . Arthritis   . Asthma    seasonally  . Cancer ([place])    skin  . 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   . Depression   . GERD (gastroesophageal reflux disease)    takes zantac maybe 2/week  . Hyperlipidemia   . Lactose intolerance 10/04/2013  . Microhematuria 01/2006   saw Dr.Peterson, renal u/s: r pyelocaliectasis but CT was neg, cysto neg  . Shortness of breath    only with asthma flare up  . Sleep apnea    rarely uses cpap    Past Surgical History:  Procedure Laterality Date  . ANTERIOR CERVICAL DECOMP/DISCECTOMY FUSION N/A 08/23/2014   Procedure: C5-6 Anterior Cervical Discectomy and Fusion, Allograft, Plate;  Surgeon: Marybelle Killings, MD;  Location: [facility];  Service: Orthopedics;  Laterality: N/A;  . cervical cauterization    . CESAREAN SECTION    . GANGLION CYST EXCISION    . NASAL SEPTUM SURGERY      Social History    Social History  . Marital status: Married    Spouse name: N/A  . Number of children: 1  . 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 Smoking status: Former Smoker    Packs/day: 0.30    Years: 3.00    Types: Cigarettes  . Smokeless tobacco: Never Used     Comment: quit 04-2013 ([facility])  . Alcohol use 1.8 oz/week    3 Standard drinks or equivalent per week  . Drug use: No  . Sexual activity: Yes   Other Topics Concern  . Not on file   Social History Narrative   remarried 10-11, 1 child        Medication List       Accurate as of 08/15/16  9:26 AM. Always use your most recent med list.          albuterol 108 (90 Base) MCG/ACT inhaler Commonly known as:  PROVENTIL HFA;VENTOLIN HFA Inhale 2 puffs by mouth four times a day if needed.   cyclobenzaprine 5 MG tablet Commonly known as:  FLEXERIL Take 1 tablet (5 mg total) by mouth 2 (two) times daily as needed for muscle spasms.   desloratadine 5 MG tablet Commonly known as:  CLARINEX   FLUoxetine 20 MG capsule Commonly known as:  PROZAC Take 1 capsule (20 mg total) by mouth daily.   fluticasone 50 MCG/ACT nasal spray Commonly known as:  FLONASE Place 2 sprays into both nostrils daily.   ibuprofen 800 MG tablet Commonly known as:  ADVIL,MOTRIN take 1 tablet by mouth every 6 to 8 hours if needed for pain   Insulin Pen Needle 32G X 6 MM Misc To use w/ Saxenda   Liraglutide -Weight Management 18 MG/3ML Sopn Commonly known as:  SAXENDA Inject 3 mg into the skin daily. Use dosing guidelines as given by MD.   losartan 100 MG tablet Commonly known as:  COZAAR Take 1 tablet (100 mg total) by mouth daily.   naproxen 500 MG tablet Commonly known as:  NAPROSYN Take 1 tablet (500 mg total) by mouth every morning.   PAZEO 0.7 % Soln Generic drug:  Olopatadine HCl          Objective:   Physical Exam BP 132/78 (BP Location: Left  Arm, Patient Position: Sitting, Cuff Size: Normal)   Pulse 74   Temp 98.2 F (36.8 C) (Oral)   Resp 14   Ht 5\' 8"  (1.727 m)   Wt 177 lb 8 oz (80.5 kg)   SpO2 97%   BMI 26.99 kg/m  General:   Well developed, well nourished . NAD.  HEENT:  Normocephalic . Face symmetric, atraumatic Lungs:  CTA B Normal respiratory effort, no intercostal retractions, no accessory muscle use. Heart: RRR,  no murmur.  no pretibial edema bilaterally  Abdomen:  Not distended, soft, non-tender. No rebound or rigidity. Bowel sounds are present. Skin: Not pale. Not jaundice Neurologic:  alert & oriented X3.  Speech normal, gait appropriate for age and unassisted Psych--  Cognition and judgment appear intact.  Cooperative with normal attention span and concentration.  Behavior appropriate. No anxious or depressed appearing.    Assessment & Plan:    Assessment HTN Hyperlipidemia Anxiety, depression GERD Seasonal asthma Allergies OSA: Uses CPAP irregularly MSK: DJD, occasionally uses pain medication H/o Carcinoma in situ, perianal skin, XRT, chemotherapy 2000. Not further follow-ups with specialist H/o Microhematuria 2007, Dr. Terance Hart, renal US showed right pyelocaliectasis, CT was negative, cystoscopy negative  PLAN: Abdominal pain: x 2- 3 days ; no red flag symptoms, she is taking Saxendsa for the last few weeks  (pancreatitis is a potential  s/e). She also has a history of UTIs. Plan: Hold Saxenda, labs (CMP, CBC, amylase, lipase, UA urine culture). Call if not gradually improving, call if severe symptoms. See AVS Obesity: Started Saxenda 06-02-2016, initial weight 190, weight today 177. Will hold medications for 4 weeks due to abdominal pain, encouraged to continue practicing the better diet habits she has developed. Due for a colonoscopy, states she already contacted GI.

## 2016-08-17 LAB — URINE CULTURE: Organism ID, Bacteria: NO GROWTH

## 2016-08-26 ENCOUNTER — Other Ambulatory Visit: Payer: Self-pay | Admitting: Internal Medicine

## 2016-08-26 NOTE — Telephone Encounter (Signed)
Patient requesting a refill FLUoxetine (PROZAC) 20 MG capsule

## 2016-09-01 DIAGNOSIS — H2511 Age-related nuclear cataract, right eye: Secondary | ICD-10-CM | POA: Diagnosis not present

## 2016-09-12 ENCOUNTER — Telehealth: Payer: Self-pay

## 2016-09-12 ENCOUNTER — Ambulatory Visit (AMBULATORY_SURGERY_CENTER): Payer: BLUE CROSS/BLUE SHIELD | Admitting: *Deleted

## 2016-09-12 VITALS — Ht 68.0 in | Wt 176.0 lb

## 2016-09-12 DIAGNOSIS — Z86010 Personal history of colonic polyps: Secondary | ICD-10-CM

## 2016-09-12 MED ORDER — NA SULFATE-K SULFATE-MG SULF 17.5-3.13-1.6 GM/177ML PO SOLN: 1.0000 | Freq: Once | ORAL | 0 refills | Status: AC

## 2016-09-12 NOTE — Progress Notes (Signed)
No egg or soy allergy known to patient  No issues with past sedation with any surgeries  or procedures, no intubation problems  No diet pills per patient with phentermine  No home 02 use per patient  No blood thinners per patient  Pt denies issues with constipation  No A fib or A flutter   emmi declined

## 2016-09-12 NOTE — Telephone Encounter (Signed)
[facility] pharmacy informed of PA approval.

## 2016-09-12 NOTE — Telephone Encounter (Signed)
Received PA 4 month renewal request for Saxenda. Pt has lost 4 % body weight. PA initiated via Covermymeds; KEY: [id number]. Received real time response. PA approved through 09/12/2017.   [id number] Name:Weight loss drugs - Saxenda - PA ESI;Status:Approved;Coverage Start Date:08/13/2016;Coverage End Date:09/12/2017;

## 2016-09-25 ENCOUNTER — Encounter: Payer: Self-pay | Admitting: Gastroenterology

## 2016-10-01 ENCOUNTER — Ambulatory Visit: Admitting: Neurological Surgery

## 2016-10-01 ENCOUNTER — Ambulatory Visit: Payer: BLUE CROSS/BLUE SHIELD | Admitting: Internal Medicine

## 2016-10-01 ENCOUNTER — Ambulatory Visit: Admit: 2016-10-01 | Payer: Medicaid HMO

## 2016-10-01 NOTE — Progress Notes (Signed)
* * *        **[name]    --- ---    19 Y old Female, DOB: Oct 17, 1956, External MRN: [id number]    Account Number: [id number]    [address], [place], [id number]    Home: [phone]    Insurance: [employer]    PCP: Shelly Flatten, DO Referring: Shelly Flatten, DO    Appointment Facility: Neurosurgery        * * *    10/01/2016  Progress Notes: Glendell Docker, MD **CHN#:** [id number]    --- ---    ---        Reason for Appointment    ---      1\. 1 year status post L4-L5 decompression with continued lower back pain    ---      History of Present Illness    ---     _NEUROSURGERY_ Para March is s/p L4-5 decompression and fusion for spinal stenosis and  spondylolisthesis in December 2016. She complains of continued low back pain  which is most often constant but can be intermittent; also reports improved  stiffness, weakness with walking or any strenous activity. Reports that when  she puts weight on the L there is a zinging sensation that radiates down the  the L buttock to the lateral thigh. She reports improved ability to roll over  in bed or side to side.    Lower back pain radiates occassionally down to feet, which presents as  tingling and numbness in the bilateral feet. Reports that pain is triggered  with partial bending and/or reaching; pushing/pulling in certain positions  also triggers the pain. Not currently taking any medication for the pain; uses  ice packs for pain. Has continued to do PT on/off since last year; PT can been  concentrating on thoracic area. Has seen a pain clinic; wanted patient to see  MD Molinda Bailiff before any injections are given.      Current Medications    ---    Taking     * Mupirocin Ointment 1 application in nostril as needed for MRSA flareup Externally as directed    ---    * Nystatin     ---    * Omeprazole 40 MG Capsule Delayed Release 1 capsule. two days on, one day off Orally as directed    ---    * Seroquel 75 MG 1.5  tablet at bedtime Orally Once a day    ---    * Tums 500 MG Tablet Chewable 1 tablet as needed for GI upset Orally Four times a day    ---    * Vitamin D3 2000 UNIT Tablet 1 tablet Orally Once a day    ---    * Zoloft 100 MG Tablet 1 tablet in morning Orally Once a day    ---    Not-Taking/PRN    * Citalopram Hydrobromide 20 MG Tablet 1 tablet Orally Once a day    ---    * Tylenol 325 MG Tablet 2 tablets as needed Orally every 6 hrs    ---    * Medication List reviewed and reconciled with the patient    ---      Past Medical History    ---       Kidney Stones .        ---    Chronic Depression .        ---  Anxiety.        ---    Post Tramatic Stress Disorder .        ---    MRSA left nostril (flares up occasionally).        ---      Surgical History    ---      Appendectomy    ---    Percutaneous diskectomy L4-5    ---    L4-5 laminectomy and TLIF with bilateral L4-5 pedicle screw fixation 10/02/2015    ---      Family History    ---      Mother: deceased    ---    Father: deceased    ---    5 sister(s) .    ---      Social History    ---    Tobacco  history: Never smoked.    Work/Occupation: unemployed, Sport and exercise psychologist.      Allergies    ---      sulfa    ---    penicillin    ---    latex    ---    dark dye    ---      Hospitalization/Major Diagnostic Procedure    ---      see surgeries    ---      Review of Systems    ---     _GENERAL_ :    Negative for: chest pain,, breast discharge,, irregular heart beats,, heart  murmurs,, nausea / vomiting,, easy bruising,, fever / chills,, frequent nose  bleeds,, frequent headaches,, shortness of breath,, kidney disease,, liver  disease,, previous anesthesiea problems,, excess bleeding during or after  prior procedure,. Positive for: weight gain in the past year,, hoarse voice,.    _NEUROLOGY_ :    Positive for: ringing in the ears,, , back pain, weakness. Negative for: loss  of smell,, double vision,, difficulty swallowing,, vision loss in one eye or  the other,, hearing loss,,  trouble with balance and coordination,.          Vital Signs    ---    Pain scale 7, Ht-in 5'1", Wt-lbs 120, BMI 22.67, BP 124/59, HR 71, BSA 1.53,  Wt-kg 54.43, Wt Change 1 lb.      Physical Examination    ---     _NEUROSURGERY_ :    MOTOR Lower Extremities : 5/5 strength in the bilateral lower extremities.  SENSATION : Intact to light touch in the bilateral lower extremities Reports  slightly altered sensation to L thigh vs R thigh.. Reflexes Right Ankle Jerk  1+, Left Ankle Jerk 1+, Right Knee Jerk 2+, Left Knee Jerk 2+. Gait : WNL.  Babinski : Negative. Ankle Clonus : Negative.          Assessments    ---    1\. Low back pain - M54.5 (Primary)    ---    2\. Other chronic pain - G89.29    ---      Patient presents with continued lower back pain that has not completely  improved after surgery. Reviewing xrays that were completed indicates that  hardware is still positioned well. I discussed continuning PT with the patient  and the possibility that accupuncture may improve her pain. I do not believe  at this time that injections into her neck will improve her pain. Patient's  questions regarding previous surgery were answered.    ---      Follow Up    ---  prn    Electronically signed by Glendell Docker , MD on 10/03/2016 at 02:14 PM EST    Sign off status: Completed        * * *        Neurosurgery    [address]    [place], [place] [address]    Tel: [phone]    Fax: [phone]              * * *          Patient: CHELBI, HERBER DOB: 07/03/1956 Progress Note: Glendell Docker, MD  10/01/2016    ---    Note generated by eClinicalWorks EMR/PM Software ([email or web address])

## 2016-10-01 NOTE — Progress Notes (Signed)
.  Progress Notes  .  Patient: [name]  Provider: Glendell Docker    .  DOB:1956-01-21 Age: 60 Y Sex: Female  .  PCP: Shelly Flatten DO  Date: 10/01/2016  .  --------------------------------------------------------------------------------  .  REASON FOR APPOINTMENT  .  1. 1 year status post L4-L5 decompression with continued lower  back pain  .  HISTORY OF PRESENT ILLNESS  .  NEUROSURGERY:  Reizy is s/p L4-5 decompression  and fusion for spinal stenosis and spondylolisthesis in December  2016. She complains of continued low back pain which is most  often constant but can be intermittent; also reports improved  stiffness, weakness with walking or any strenous activity.  Reports that when she puts weight on the L there is a zinging  sensation that radiates down the the L buttock to the lateral  thigh. She reports improved ability to roll over in bed or side  to side. Lower back pain radiates occassionally down to feet,  which presents as tingling and numbness in the bilateral feet.  Reports that pain is triggered with partial bending and/or  reaching; pushing/pulling in certain positions also triggers the  pain. Not currently taking any medication for the pain; uses ice  packs for pain. Has continued to do PT on/off since last year; PT  can been concentrating on thoracic area. Has seen a pain clinic;  wanted patient to see MD Molinda Bailiff before any injections are  given.  .  CURRENT MEDICATIONS  .  Taking Mupirocin Ointment 1 application in nostril as needed for  MRSA flareup Externally as directed  Taking Nystatin  Taking Omeprazole 40 MG Capsule Delayed Release 1 capsule. two  days on, one day off Orally as directed  Taking Seroquel 75 MG 1.5 tablet at bedtime Orally Once a day  Taking Tums 500 MG Tablet Chewable 1 tablet as needed for GI  upset Orally Four times a day  Taking Vitamin D3 2000 UNIT Tablet 1 tablet Orally Once a day  Taking Zoloft 100 MG Tablet 1 tablet in morning Orally Once a  day  Not-Taking/PRN Citalopram Hydrobromide 20 MG Tablet 1 tablet  Orally Once a day  Not-Taking/PRN Tylenol 325 MG Tablet 2 tablets as needed Orally  every 6 hrs  Medication List reviewed and reconciled with the patient  .  PAST MEDICAL HISTORY  .  Kidney Stones  Chronic Depression  Anxiety  Post Tramatic Stress Disorder  MRSA left nostril (flares up occasionally)  .  ALLERGIES  .  sulfa  penicillin  latex  dark dye  .  SURGICAL HISTORY  .  Appendectomy  Percutaneous diskectomy L4-5  L4-5 laminectomy and TLIF with bilateral L4-5 pedicle screw  fixation 10/02/2015  .  FAMILY HISTORY  .  Mother: deceased  Father: deceased  5 sister(s) .  Marland Kitchen  SOCIAL HISTORY  .  .  Tobaccohistory:Never smoked.  .  Work/Occupation: unemployed, SSDI.  Marland Kitchen  HOSPITALIZATION/MAJOR DIAGNOSTIC PROCEDURE  .  see surgeries  .  REVIEW OF SYSTEMS  .  GENERAL:  .  Negative for:    chest pain,, breast discharge,, irregular heart  beats,, heart murmurs,, nausea / vomiting,, easy bruising,, fever  / chills,, frequent nose bleeds,, frequent headaches,, shortness  of breath,, kidney disease,, liver disease,, previous anesthesiea  problems,, excess bleeding during or after prior procedure, .  Positive for:    weight gain in the past year,, hoarse voice, .  .  NEUROLOGY:  .  Positive for:  ringing in the ears,, , back pain, weakness .  Negative for:    loss of smell,, double vision,, difficulty  swallowing,, vision loss in one eye or the other,, hearing loss,,  trouble with balance and coordination, .  .  VITAL SIGNS  .  Pain scale 7, Ht-in 5'1", Wt-lbs 120, BMI 22.67, BP 124/59, HR  71, BSA 1.53, Wt-kg 54.43, Wt Change 1 lb.  .  PHYSICAL EXAMINATION  .  NEUROSURGERY:  MOTOR Lower Extremities  : 5/5 strength in the bilateral lower  extremities. :5/5 strength in the bilateral lower extremities.  SENSATION  : Intact to light touch in the bilateral lower  extremities Reports slightly altered sensation to L thigh vs R  thigh.. :Intact to light touch in the  bilateral lower extremities  Reports slightly altered sensation to L thigh vs R thigh..  Reflexes  Left Knee Jerk 2+, Right Knee Jerk 2+, Left Ankle Jerk  1+, Right Ankle Jerk 1+. Right Ankle Jerk1+ , Left Ankle Jerk1+ ,  Right Knee Jerk2+ , Left Knee Jerk2+. Gait  : WNL. :WNL. Babinski   : Negative. :Negative. Ankle Clonus  : Negative. :Negative.  .  ASSESSMENTS  .  Low back pain - M54.5 (Primary)  .  Other chronic pain - G89.29  .  Patient presents with continued lower back pain that has not  completely improved after surgery. Reviewing xrays that were  completed indicates that hardware is still positioned well. I  discussed continuning PT with the patient and the possibility  that accupuncture may improve her pain. I do not believe at this  time that injections into her neck will improve her pain.  Patient's questions regarding previous surgery were answered.  .  FOLLOW UP  .  prn  .  Electronically signed by Glendell Docker , MD on  10/03/2016 at 02:14 PM EST  .  Document electronically signed by Glendell Docker    .

## 2016-10-03 ENCOUNTER — Encounter: Payer: Self-pay | Admitting: Gastroenterology

## 2016-10-03 ENCOUNTER — Ambulatory Visit (AMBULATORY_SURGERY_CENTER): Payer: BLUE CROSS/BLUE SHIELD | Admitting: Gastroenterology

## 2016-10-03 VITALS — BP 119/80 | HR 78 | Temp 97.5°F | Resp 16 | Ht 68.0 in | Wt 176.0 lb

## 2016-10-03 DIAGNOSIS — D122 Benign neoplasm of ascending colon: Secondary | ICD-10-CM

## 2016-10-03 DIAGNOSIS — K635 Polyp of colon: Secondary | ICD-10-CM | POA: Diagnosis not present

## 2016-10-03 DIAGNOSIS — Z86010 Personal history of colonic polyps: Secondary | ICD-10-CM | POA: Diagnosis present

## 2016-10-03 DIAGNOSIS — D120 Benign neoplasm of cecum: Secondary | ICD-10-CM

## 2016-10-03 DIAGNOSIS — D128 Benign neoplasm of rectum: Secondary | ICD-10-CM

## 2016-10-03 DIAGNOSIS — K5730 Diverticulosis of large intestine without perforation or abscess without bleeding: Secondary | ICD-10-CM

## 2016-10-03 DIAGNOSIS — K621 Rectal polyp: Secondary | ICD-10-CM

## 2016-10-03 MED ORDER — SODIUM CHLORIDE 0.9 % IV SOLN: 500.0000 mL | INTRAVENOUS | Status: DC

## 2016-10-03 NOTE — Progress Notes (Signed)
Called to room to assist during endoscopic procedure.  Patient ID and intended procedure confirmed with present staff. Received instructions for my participation in the procedure from the performing physician.  

## 2016-10-03 NOTE — Progress Notes (Signed)
Report to PACU, RN, vss, BBS= Clear.  

## 2016-10-03 NOTE — Op Note (Signed)
[facility] Patient Name: Sheila Fuller Procedure Date: 10/03/2016 10:06 AM MRN: [id number] Endoscopist: Milus Banister , MD Age: 60 Referring MD:  Date of Birth: 09-06-1956 Gender: Female Account #: [id number] Procedure:                Colonoscopy Indications:              High risk colon cancer surveillance: Personal                            history of colonic polyps (colonoscopy 2007 13mm                            TVA, colonoscopy 2012 3 polyps one TA, one SSA) Medicines:                Monitored Anesthesia Care Procedure:                Pre-Anesthesia Assessment:                           - Prior to the procedure, a History and Physical                            was performed, and patient medications and                            allergies were reviewed. The patient's tolerance of                            previous anesthesia was also reviewed. The risks                            and benefits of the procedure and the sedation                            options and risks were discussed with the patient.                            All questions were answered, and informed consent                            was obtained. Prior Anticoagulants: The patient has                            taken no previous anticoagulant or antiplatelet                            agents. ASA Grade Assessment: II - A patient with                            mild systemic disease. After reviewing the risks                            and benefits, the patient was deemed in  satisfactory condition to undergo the procedure.                           After obtaining informed consent, the colonoscope                            was passed under direct vision. Throughout the                            procedure, the patient's blood pressure, pulse, and                            oxygen saturations were monitored continuously. The                            Model CF-HQ190L  [id number]) scope was introduced                            through the anus and advanced to the the cecum,                            identified by appendiceal orifice and ileocecal                            valve. The colonoscopy was performed without                            difficulty. The patient tolerated the procedure                            well. The quality of the bowel preparation was                            good. The ileocecal valve, appendiceal orifice, and                            rectum were photographed. I could not perform                            retroflex in rectum due to small rectal vault                            however good distal rectal views obtained with                            standard views. Scope In: 10:08:17 AM Scope Out: 10:21:34 AM Scope Withdrawal Time: 0 hours 9 minutes 29 seconds  Total Procedure Duration: 0 hours 13 minutes 17 seconds  Findings:                 Three sessile polyps were found in the rectum and                            cecum. The polyps were 3 to 5 mm in size.  These                            polyps were removed with a cold snare. Resection                            and retrieval were complete.                           A 7 mm polyp was found in the ascending colon. The                            polyp was sessile. The polyp was removed with a hot                            snare. Resection and retrieval were complete.                           Diverticulosis in left colon.                           The exam was otherwise without abnormality. Complications:            No immediate complications. Estimated blood loss:                            None. Estimated Blood Loss:     Estimated blood loss: none. Impression:               - Three 3 to 5 mm polyps in the rectum and in the                            cecum, removed with a cold snare. Resected and                            retrieved.                           -  One 7 mm polyp in the ascending colon, removed                            with a hot snare. Resected and retrieved.                           - Diverticulosis in the left colon.                           - The examination was otherwise normal. Recommendation:           - Patient has a contact number available for                            emergencies. The signs and symptoms of potential                            delayed  complications were discussed with the                            patient. Return to normal activities tomorrow.                            Written discharge instructions were provided to the                            patient.                           - Resume previous diet.                           - Continue present medications.                           You will receive a letter within 2-3 weeks with the                            pathology results and my final recommendations.                           If the polyp(s) is proven to be 'pre-cancerous' on                            pathology, you will need repeat colonoscopy in 3-5                            years. Milus Banister, MD 10/03/2016 10:26:31 AM This report has been signed electronically.

## 2016-10-03 NOTE — Patient Instructions (Signed)
YOU HAD AN ENDOSCOPIC PROCEDURE TODAY AT [facility]:   Refer to the procedure report that was given to you for any specific questions about what was found during the examination.  If the procedure report does not answer your questions, please call your gastroenterologist to clarify.  If you requested that your care partner not be given the details of your procedure findings, then the procedure report has been included in a sealed envelope for you to review at your convenience later.  YOU SHOULD EXPECT: Some feelings of bloating in the abdomen. Passage of more gas than usual.  Walking can help get rid of the air that was put into your GI tract during the procedure and reduce the bloating. If you had a lower endoscopy (such as a colonoscopy or flexible sigmoidoscopy) you may notice spotting of blood in your stool or on the toilet paper. If you underwent a bowel prep for your procedure, you may not have a normal bowel movement for a few days.  Please Note:  You might notice some irritation and congestion in your nose or some drainage.  This is from the oxygen used during your procedure.  There is no need for concern and it should clear up in a day or so.  SYMPTOMS TO REPORT IMMEDIATELY:   Following lower endoscopy (colonoscopy or flexible sigmoidoscopy):  Excessive amounts of blood in the stool  Significant tenderness or worsening of abdominal pains  Swelling of the abdomen that is new, acute  Fever of 100F or higher   For urgent or emergent issues, a gastroenterologist can be reached at any hour by calling [phone].   DIET:  We do recommend a small meal at first, but then you may proceed to your regular diet.  Drink plenty of fluids but you should avoid alcoholic beverages for 24 hours.  ACTIVITY:  You should plan to take it easy for the rest of today and you should NOT DRIVE or use heavy machinery until tomorrow (because of the sedation medicines used during the test).     FOLLOW UP: Our staff will call the number listed on your records the next business day following your procedure to check on you and address any questions or concerns that you may have regarding the information given to you following your procedure. If we do not reach you, we will leave a message.  However, if you are feeling well and you are not experiencing any problems, there is no need to return our call.  We will assume that you have returned to your regular daily activities without incident.  If any biopsies were taken you will be contacted by phone or by letter within the next 1-3 weeks.  Please call us at [phone] if you have not heard about the biopsies in 3 weeks.    SIGNATURES/CONFIDENTIALITY: You and/or your care partner have signed paperwork which will be entered into your electronic medical record.  These signatures attest to the fact that that the information above on your After Visit Summary has been reviewed and is understood.  Full responsibility of the confidentiality of this discharge information lies with you and/or your care-partner.  Polyps, diverticulosis, high fiber diet-handouts given  Repeat colonoscopy will be determined by pathology.

## 2016-10-06 ENCOUNTER — Telehealth: Payer: Self-pay | Admitting: *Deleted

## 2016-10-06 NOTE — Telephone Encounter (Signed)
  Follow up Call-  Call back number 10/03/2016  Post procedure Call Back phone  # [phone]  Permission to leave phone message Yes  Some recent data might be hidden     Patient questions:  Do you have a fever, pain , or abdominal swelling? No. Pain Score  0 *  Have you tolerated food without any problems? Yes.    Have you been able to return to your normal activities? Yes.    Do you have any questions about your discharge instructions: Diet   No. Medications  No. Follow up visit  No.  Do you have questions or concerns about your Care? No.  Actions: * If pain score is 4 or above: No action needed, pain <4.

## 2016-10-08 ENCOUNTER — Telehealth: Payer: Self-pay | Admitting: Internal Medicine

## 2016-10-08 MED ORDER — INSULIN PEN NEEDLE 32G X 6 MM MISC: 5 refills | Status: DC

## 2016-10-08 NOTE — Telephone Encounter (Signed)
Rx sent 

## 2016-10-08 NOTE — Telephone Encounter (Signed)
Caller name: Relationship to patient: Self Can be reached: [phone]  Pharmacy:  [facility], [place] [phone] (Phone) [phone] (Fax)     Reason for call: Request refill on Insulin Pen Needle 32G X 6 MM MISC [id number]  Patient would like to know if a Rx can be called in for the needles ONLY

## 2016-10-13 ENCOUNTER — Ambulatory Visit (INDEPENDENT_AMBULATORY_CARE_PROVIDER_SITE_OTHER): Payer: BLUE CROSS/BLUE SHIELD | Admitting: Internal Medicine

## 2016-10-13 ENCOUNTER — Encounter: Payer: Self-pay | Admitting: Gastroenterology

## 2016-10-13 ENCOUNTER — Encounter: Payer: Self-pay | Admitting: Internal Medicine

## 2016-10-13 VITALS — BP 124/76 | HR 80 | Temp 97.6°F | Resp 14 | Ht 68.0 in | Wt 177.5 lb

## 2016-10-13 DIAGNOSIS — E6609 Other obesity due to excess calories: Secondary | ICD-10-CM

## 2016-10-13 DIAGNOSIS — R1030 Lower abdominal pain, unspecified: Secondary | ICD-10-CM

## 2016-10-13 NOTE — Progress Notes (Signed)
Subjective:    Patient ID: Sheila Fuller, female    DOB: [birth year], 60 y.o.   MRN: [id number]  DOS:  10/13/2016 Type of visit - description : f/u Interval history: Since the last office visit, she restarted saxenda successfully. Currently on 1.8 mL's daily. Is eating  healthier with more fruits and vegetables, more fluids. Is trying to control intake of  cheese and carbohydrates.  Wt Readings from Last 3 Encounters:  10/13/16 177 lb 8 oz (80.5 kg)  10/03/16 176 lb (79.8 kg)  09/12/16 176 lb (79.8 kg)     Review of Systems Denies abdominal pain. No nausea, vomiting, diarrhea.  Past Medical History:  Diagnosis Date  . Allergic rhinitis   . Allergy   . Anxiety   . Arthritis   . Asthma    seasonally  . Cancer ([place])    skin  . 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   . Cataract    will remove left end of 10-2016  . Depression   . GERD (gastroesophageal reflux disease)    takes zantac maybe 2/week  . Hyperlipidemia   . Lactose intolerance 10/04/2013  . Microhematuria 01/2006   saw Dr.Peterson, renal u/s: r pyelocaliectasis but CT was neg, cysto neg  . Shortness of breath    only with asthma flare up  . Sleep apnea    rarely uses cpap    Past Surgical History:  Procedure Laterality Date  . ANTERIOR CERVICAL DECOMP/DISCECTOMY FUSION N/A 08/23/2014   Procedure: C5-6 Anterior Cervical Discectomy and Fusion, Allograft, Plate;  Surgeon: Marybelle Killings, MD;  Location: [facility];  Service: Orthopedics;  Laterality: N/A;  . cervical cauterization    . CESAREAN SECTION    . COLONOSCOPY    . GANGLION CYST EXCISION    . NASAL SEPTUM SURGERY    . POLYPECTOMY      Social History   Social History  . Marital status: Married    Spouse name: N/A  . Number of children: 1  . 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 Smoking status: Former Smoker   Packs/day: 0.30    Years: 3.00    Types: Cigarettes  . Smokeless tobacco: Never Used     Comment: quit 04-2013 ([facility])  . Alcohol use 1.8 oz/week    3 Standard drinks or equivalent per week     Comment: a week   . Drug use: No  . Sexual activity: Yes   Other Topics Concern  . Not on file   Social History Narrative   remarried 10-11, 1 child      Allergies as of 10/13/2016      Reactions   Statins    REACTION: myalgia   Sulfonamide Derivatives Rash      Medication List       Accurate as of 10/13/16 11:59 PM. Always use your most recent med list.          albuterol 108 (90 Base) MCG/ACT inhaler Commonly known as:  PROVENTIL HFA;VENTOLIN HFA Inhale 2 puffs by mouth four times a day if needed.   cyclobenzaprine 5 MG tablet Commonly known as:  FLEXERIL Take 1 tablet (5 mg total) by mouth 2 (two) times daily as needed for muscle spasms.   desloratadine 5 MG tablet Commonly known as:  CLARINEX   FLUoxetine 20  MG capsule Commonly known as:  PROZAC Take 1 capsule (20 mg total) by mouth daily.   fluticasone 50 MCG/ACT nasal spray Commonly known as:  FLONASE Place 2 sprays into both nostrils daily.   ibuprofen 800 MG tablet Commonly known as:  ADVIL,MOTRIN take 1 tablet by mouth every 6 to 8 hours if needed for pain   Insulin Pen Needle 32G X 6 MM Misc To use w/ Saxenda   Liraglutide -Weight Management 18 MG/3ML Sopn Commonly known as:  SAXENDA Inject 3 mg into the skin daily. Use dosing guidelines as given by MD.   losartan 100 MG tablet Commonly known as:  COZAAR Take 1 tablet (100 mg total) by mouth daily.   MUCINEX SINUS-MAX PO Take by mouth 4 (four) times daily as needed.   PAZEO 0.7 % Soln Generic drug:  Olopatadine HCl   phenylephrine 10 MG Tabs tablet Commonly known as:  SUDAFED PE Take 10 mg by mouth every 4 (four) hours as needed.   ranitidine 75 MG tablet Commonly known as:  ZANTAC Take 75 mg by mouth at bedtime.            Objective:   Physical Exam BP 124/76 (BP Location: Left Arm, Patient Position: Sitting, Cuff Size: Normal)   Pulse 80   Temp 97.6 F (36.4 C) (Oral)   Resp 14   Ht 5\' 8"  (1.727 m)   Wt 177 lb 8 oz (80.5 kg)   SpO2 96%   BMI 26.99 kg/m  General:   Well developed, well nourished . NAD.  HEENT:  Normocephalic . Face symmetric, atraumatic Lungs:  CTA B Normal respiratory effort, no intercostal retractions, no accessory muscle use. Heart: RRR,  no murmur.  No pretibial edema bilaterally  Skin: Not pale. Not jaundice Neurologic:  alert & oriented X3.  Speech normal, gait appropriate for age and unassisted Psych--  Cognition and judgment appear intact.  Cooperative with normal attention span and concentration.  Behavior appropriate. No anxious or depressed appearing.      Assessment & Plan:   Assessment HTN Hyperlipidemia Anxiety, depression GERD Seasonal asthma Allergies Obesity : BMI~ 28 plus HTN, hyperlipidemia, OSA OSA: Uses CPAP irregularly MSK: DJD, occasionally uses pain medication H/o Carcinoma in situ, perianal skin, XRT, chemotherapy 2000. Not further follow-ups with specialist H/o Microhematuria 2007, Dr. Terance Hart, renal US showed right pyelocaliectasis, CT was negative, cystoscopy negative  PLAN: Abdominal pain: see last OV.Resolved Obesity: Started meds 06/02/2016, initial weight 190, she held saxenda x several l weeks but is now back on it. Weight has stabilized at around 177. The  plan is to increase dose gradually to 3 mls qd ; plans to do even  better with diet after the holidays. Refill meds when necessary. RTC 2 months

## 2016-10-13 NOTE — Patient Instructions (Signed)
GO TO THE FRONT DESK Schedule your next appointment for a  Check up in 2 months    Up Saxenda gradually to 3 ml a day

## 2016-10-13 NOTE — Progress Notes (Signed)
Pre visit review using our clinic review tool, if applicable. No additional management support is needed unless otherwise documented below in the visit note. 

## 2016-10-14 NOTE — Assessment & Plan Note (Signed)
Abdominal pain: see last OV.Resolved Obesity: Started meds 06/02/2016, initial weight 190, she held saxenda x several l weeks but is now back on it. Weight has stabilized at around 177. The  plan is to increase dose gradually to 3 mls qd ; plans to do even  better with diet after the holidays. Refill meds when necessary. RTC 2 months

## 2016-10-28 ENCOUNTER — Ambulatory Visit: Admitting: Primary Care

## 2016-10-28 NOTE — Progress Notes (Signed)
* * *        **  [name]**    --- ---    56 Y old Female, DOB: 11/11/1955    [address], [address], [place], [place] [address]    Home: [phone]    Provider: Verdon Cummins, NP        * * *    Telephone Encounter    ---    Answered by   Verdon Cummins  Date: 10/28/2016         Time: 10:17 AM    Caller   Patient    --- ---            Reason   new lower back pain            Message                      Patient reports that on last Saturday night she attempted to lay on her abdomen when going to sleep and she immediately felt pain that radiated from her lower back to her left leg to her left foot. The pain is now felt occassionally but is not constant and is described as an ache. Patient had tylenol yesterday; patient unable to state if this was effective. Discussed with Dr. Molinda Bailiff who does not believe that further imaging is warrented at this time and that patient should monitor over time. Patient made aware of this.                     * * *                ---          * * *          Patient: [name], [name] DOB: 06/13/1956 Provider: Verdon Cummins, NP  10/28/2016    ---    Note generated by eClinicalWorks EMR/PM Software ([email or web address])

## 2016-11-25 DIAGNOSIS — H18411 Arcus senilis, right eye: Secondary | ICD-10-CM | POA: Diagnosis not present

## 2016-11-25 DIAGNOSIS — H2512 Age-related nuclear cataract, left eye: Secondary | ICD-10-CM | POA: Diagnosis not present

## 2016-11-25 DIAGNOSIS — H2513 Age-related nuclear cataract, bilateral: Secondary | ICD-10-CM | POA: Diagnosis not present

## 2016-11-25 DIAGNOSIS — H25011 Cortical age-related cataract, right eye: Secondary | ICD-10-CM | POA: Diagnosis not present

## 2016-11-25 DIAGNOSIS — H02839 Dermatochalasis of unspecified eye, unspecified eyelid: Secondary | ICD-10-CM | POA: Diagnosis not present

## 2016-11-26 ENCOUNTER — Other Ambulatory Visit: Payer: Self-pay | Admitting: Internal Medicine

## 2016-11-26 ENCOUNTER — Other Ambulatory Visit: Payer: Self-pay | Admitting: Obstetrics & Gynecology

## 2016-11-26 DIAGNOSIS — Z1231 Encounter for screening mammogram for malignant neoplasm of breast: Secondary | ICD-10-CM

## 2016-11-27 ENCOUNTER — Other Ambulatory Visit: Payer: Self-pay | Admitting: Internal Medicine

## 2016-12-23 ENCOUNTER — Encounter: Payer: Self-pay | Admitting: Internal Medicine

## 2016-12-23 ENCOUNTER — Ambulatory Visit (INDEPENDENT_AMBULATORY_CARE_PROVIDER_SITE_OTHER): Payer: BLUE CROSS/BLUE SHIELD | Admitting: Internal Medicine

## 2016-12-23 VITALS — BP 126/74 | HR 76 | Temp 97.5°F | Resp 12 | Ht 68.0 in | Wt 171.5 lb

## 2016-12-23 DIAGNOSIS — E6609 Other obesity due to excess calories: Secondary | ICD-10-CM | POA: Diagnosis not present

## 2016-12-23 DIAGNOSIS — I10 Essential (primary) hypertension: Secondary | ICD-10-CM | POA: Diagnosis not present

## 2016-12-23 DIAGNOSIS — Z6830 Body mass index (BMI) 30.0-30.9, adult: Secondary | ICD-10-CM

## 2016-12-23 NOTE — Patient Instructions (Signed)
   GO TO THE FRONT DESK Schedule your next appointment for a  Check up in 6 months  

## 2016-12-23 NOTE — Assessment & Plan Note (Signed)
Obesity: Started saxenda,  06/02/2016, initial weight 190, today 171. She has been very successful, no side effects. She is making meaningful changes in her lifestyle. Plan: Continue creating healthy habits, call for saxenda RFs prn  HTN: Since she is doing better with lifestyle, BP may start decreasing, will call if BP is consistently less than 110 otherwise continue medications rtc 6 months

## 2016-12-23 NOTE — Progress Notes (Signed)
Subjective:    Patient ID: Sheila Fuller, female    DOB: 11/03/1955, 61 y.o.   MRN: [id number]  DOS:  12/23/2016 Type of visit - description : f/u Interval history: Doing well on saxenda, using 3 mls daily, she has made good changes on his diet, trying to do portion control and eating healthier. She is more active now, take walks and plays bowling   Wt Readings from Last 3 Encounters:  12/23/16 171 lb 8 oz (77.8 kg)  10/13/16 177 lb 8 oz (80.5 kg)  10/03/16 176 lb (79.8 kg)    Review of Systems Denies nausea, vomiting. No diarrhea Energy level has definitely increased, not sleepy.  Past Medical History:  Diagnosis Date  . Allergic rhinitis   . Allergy   . Anxiety   . Arthritis   . Asthma    seasonally  . Cancer ([place])    skin  . 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   . Cataract    will remove left end of 10-2016  . Depression   . GERD (gastroesophageal reflux disease)    takes zantac maybe 2/week  . Hyperlipidemia   . Lactose intolerance 10/04/2013  . Microhematuria 01/2006   saw Dr.Peterson, renal u/s: r pyelocaliectasis but CT was neg, cysto neg  . Shortness of breath    only with asthma flare up  . Sleep apnea    rarely uses cpap    Past Surgical History:  Procedure Laterality Date  . ANTERIOR CERVICAL DECOMP/DISCECTOMY FUSION N/A 08/23/2014   Procedure: C5-6 Anterior Cervical Discectomy and Fusion, Allograft, Plate;  Surgeon: Marybelle Killings, MD;  Location: [facility];  Service: Orthopedics;  Laterality: N/A;  . cervical cauterization    . CESAREAN SECTION    . COLONOSCOPY    . GANGLION CYST EXCISION    . NASAL SEPTUM SURGERY    . POLYPECTOMY      Social History   Social History  . Marital status: Married    Spouse name: N/A  . Number of children: 1  . 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 Smoking status: Former Smoker   Packs/day: 0.30    Years: 3.00    Types: Cigarettes  . Smokeless tobacco: Never Used     Comment: quit 04-2013 ([facility])  . Alcohol use 1.8 oz/week    3 Standard drinks or equivalent per week     Comment: a week   . Drug use: No  . Sexual activity: Yes   Other Topics Concern  . Not on file   Social History Narrative   remarried 10-11, 1 child      Allergies as of 12/23/2016      Reactions   Statins    REACTION: myalgia   Sulfonamide Derivatives Rash      Medication List       Accurate as of 12/23/16  6:21 PM. Always use your most recent med list.          albuterol 108 (90 Base) MCG/ACT inhaler Commonly known as:  PROVENTIL HFA;VENTOLIN HFA Inhale 2 puffs by mouth four times a day if needed.   cyclobenzaprine 5 MG tablet Commonly known as:  FLEXERIL Take 1 tablet (5 mg total) by mouth 2 (two) times daily as needed for muscle spasms.   desloratadine 5 MG tablet Commonly known  as:  CLARINEX   FLUoxetine 20 MG capsule Commonly known as:  PROZAC Take 1 capsule (20 mg total) by mouth daily.   fluticasone 50 MCG/ACT nasal spray Commonly known as:  FLONASE Place 2 sprays into both nostrils daily.   ibuprofen 800 MG tablet Commonly known as:  ADVIL,MOTRIN take 1 tablet by mouth every 6 to 8 hours if needed for pain   Insulin Pen Needle 32G X 6 MM Misc To use w/ Saxenda   Liraglutide -Weight Management 18 MG/3ML Sopn Commonly known as:  SAXENDA Inject 3 mg into the skin daily. Use dosing guidelines as given by MD.   losartan 100 MG tablet Commonly known as:  COZAAR Take 1 tablet (100 mg total) by mouth daily.   MUCINEX SINUS-MAX PO Take by mouth 4 (four) times daily as needed.   PAZEO 0.7 % Soln Generic drug:  Olopatadine HCl   phenylephrine 10 MG Tabs tablet Commonly known as:  SUDAFED PE Take 10 mg by mouth every 4 (four) hours as needed.   ranitidine 75 MG tablet Commonly known as:  ZANTAC Take 75 mg by mouth at bedtime.          Objective:    Physical Exam BP 126/74 (BP Location: Left Arm, Patient Position: Sitting, Cuff Size: Normal)   Pulse 76   Temp 97.5 F (36.4 C) (Oral)   Resp 12   Ht 5\' 8"  (1.727 m)   Wt 171 lb 8 oz (77.8 kg)   SpO2 97%   BMI 26.08 kg/m  General:   Well developed, well nourished . NAD.  HEENT:  Normocephalic . Face symmetric, atraumatic Skin: Not pale. Not jaundice Neurologic:  alert & oriented X3.  Speech normal, gait appropriate for age and unassisted Psych--  Cognition and judgment appear intact.  Cooperative with normal attention span and concentration.  Behavior appropriate. No anxious or depressed appearing.      Assessment & Plan:   Assessment HTN Hyperlipidemia Anxiety, depression GERD Seasonal asthma Allergies Obesity : BMI~ 28 plus HTN, hyperlipidemia, OSA OSA: Uses CPAP irregularly MSK: DJD, occasionally uses pain medication H/o Carcinoma in situ, perianal skin, XRT, chemotherapy 2000. Not further follow-ups with specialist H/o Microhematuria 2007, Dr. Terance Hart, renal US showed right pyelocaliectasis, CT was negative, cystoscopy negative  PLAN:  Obesity: Started saxenda,  06/02/2016, initial weight 190, today 171. She has been very successful, no side effects. She is making meaningful changes in her lifestyle. Plan: Continue creating healthy habits, call for saxenda RFs prn  HTN: Since she is doing better with lifestyle, BP may start decreasing, will call if BP is consistently less than 110 otherwise continue medications rtc 6 months

## 2016-12-23 NOTE — Progress Notes (Signed)
Pre visit review using our clinic review tool, if applicable. No additional management support is needed unless otherwise documented below in the visit note. 

## 2017-01-12 DIAGNOSIS — H2512 Age-related nuclear cataract, left eye: Secondary | ICD-10-CM | POA: Diagnosis not present

## 2017-01-13 DIAGNOSIS — H2511 Age-related nuclear cataract, right eye: Secondary | ICD-10-CM | POA: Diagnosis not present

## 2017-01-19 DIAGNOSIS — H2512 Age-related nuclear cataract, left eye: Secondary | ICD-10-CM | POA: Diagnosis not present

## 2017-01-20 ENCOUNTER — Telehealth: Payer: Self-pay | Admitting: Behavioral Health

## 2017-01-20 NOTE — Telephone Encounter (Signed)
Caller: Dr. Len Blalock, Optometrist [phone], [facility]  Reason for call: Advice post surgery   Per Dr. Len Blalock, the patient had cataract surgery completed on last week, 01/12/17 and two days out from surgery she experienced headaches & black outs in the eye. Dr. Wynetta Emery followed-up with the surgeon to inquire about whether these symptoms were related to the surgery. She voiced that his response was that the symptoms did not have had anything to do with the surgery. Dr. Wynetta Emery would like to make PCP aware of this information and get his recommendations for the patient. She is knowledgable that the patient has a past medical history of hypertension & elevated chlolesterol & recent weight loss. The patient is scheduled to have another cataract surgery in 3 weeks. Message routed to Dr. Larose Kells for review.

## 2017-01-20 NOTE — Telephone Encounter (Signed)
I think she needs to be seen emergently if she is having headaches and visual disturbances. Please call the patient, recommend ER

## 2017-01-20 NOTE — Telephone Encounter (Signed)
Called patient and left message to return call

## 2017-01-21 ENCOUNTER — Telehealth: Payer: Self-pay

## 2017-01-21 NOTE — Telephone Encounter (Signed)
Noted  

## 2017-01-21 NOTE — Telephone Encounter (Signed)
Received refill requests for Pt's medications from [facility] (did not see in chart where Pt has previously used them for pharmacy). LMOM asking for call back to verify if meds need to be sent.

## 2017-01-21 NOTE — Telephone Encounter (Signed)
Spoke w/ patient, she reports symptoms have resolved and that her eye doctor is "going stupid on me. I never should have mentioned it." She was experiencing some dizziness and lightheadedness that she believes could be related to low blood pressure. She is currently asymptomatic, no HA, blurred vision, dizziness, etc. She declines office visit at this time. Reviewed last office visit note, and instructed patient to check BP at home and let us know if SBP consistently <110 per PCP instructions. Patient verbalized understanding of instructions/plan.

## 2017-01-21 NOTE — Telephone Encounter (Signed)
Called patient and left message to return call

## 2017-01-29 MED ORDER — FLUOXETINE HCL 20 MG PO CAPS: 20.0000 mg | ORAL_CAPSULE | Freq: Every day | ORAL | 1 refills | Status: DC

## 2017-01-29 MED ORDER — LOSARTAN POTASSIUM 100 MG PO TABS: 100.0000 mg | ORAL_TABLET | Freq: Every day | ORAL | 1 refills | Status: DC

## 2017-01-29 NOTE — Telephone Encounter (Signed)
Patient returning call confirming its okay to send Rx to [facility]

## 2017-01-29 NOTE — Addendum Note (Signed)
Addended byDamita Dunnings D on: 01/29/2017 09:14 AM   Modules accepted: Orders

## 2017-01-29 NOTE — Telephone Encounter (Signed)
Rxs sent

## 2017-02-17 ENCOUNTER — Ambulatory Visit: Payer: BLUE CROSS/BLUE SHIELD

## 2017-02-23 ENCOUNTER — Other Ambulatory Visit: Payer: Self-pay

## 2017-02-23 MED ORDER — LIRAGLUTIDE -WEIGHT MANAGEMENT 18 MG/3ML ~~LOC~~ SOPN: 3.0000 mg | PEN_INJECTOR | Freq: Every day | SUBCUTANEOUS | 4 refills | Status: DC

## 2017-03-04 ENCOUNTER — Ambulatory Visit: Admitting: Neurological Surgery

## 2017-03-04 ENCOUNTER — Ambulatory Visit
Admission: RE | Admit: 2017-03-04 | Discharge: 2017-03-04 | Disposition: A | Discharge status: Home / Self Care | Payer: BLUE CROSS/BLUE SHIELD | Source: Ambulatory Visit | Attending: Obstetrics & Gynecology | Admitting: Obstetrics & Gynecology

## 2017-03-04 ENCOUNTER — Ambulatory Visit: Admit: 2017-03-04 | Payer: 59

## 2017-03-04 DIAGNOSIS — Z1231 Encounter for screening mammogram for malignant neoplasm of breast: Secondary | ICD-10-CM | POA: Diagnosis not present

## 2017-03-04 NOTE — Progress Notes (Signed)
.  Progress Notes  .  Patient: [name]  Provider: Glendell Docker    .  DOB:[birth year] Age: 61 Y Sex: Female  .  PCP: Stacey Drain MD  Date: 03/04/2017  .  --------------------------------------------------------------------------------  .  REASON FOR APPOINTMENT  .  1. Lower back pain and left leg pain  .  HISTORY OF PRESENT ILLNESS  .  NEUROSURGERY:  61 year old female presents s/p L4-5  decompression and fusion for spinal stenosis and  spondylolisthesis in December 2016. She complains of continued  low back pain which is most often constant but can be  intermittent; also reports stiffness, weakness with walking or  any strenuous activity. Patient reports that she has the  stiffness and weakness with walking especially when getting out  of the chair. Reports that when she puts weight on the L leg  there is a zinging sensation that radiates down the L buttock to  the lateral thigh. She reports improved ability to roll over in  bed or side to side. Lower back pain radiates occasionally down  to feet, which presents as tingling and numbness in the bilateral  feet. Reports that pain is triggered with partial bending and/or  reaching; pushing/pulling in certain positions also triggers the  pain. Both legs are effected equally with pain. Patient reports  that her lower back pain is usually worse than the leg pain.  Patient is currently taking Advil PRN for the pain; uses ice  packs for pain. Patient has done PT in the past but is not  currently doing PT. Patient does not want an ESI at this time but  is considering acupuncture.  .  CURRENT MEDICATIONS  .  Taking Iron 325 (65 Fe) MG Tablet 1 tablet Orally Once a day  Taking Mupirocin Ointment 1 application in nostril as needed for  MRSA flareup Externally as directed  Taking Nystatin 100000 UNIT/GM Ointment 1 application to affected  area as needed Externally  Taking Omeprazole 40 MG Capsule Delayed Release 1 capsule. two  days on, one day off Orally as  directed  Taking Seroquel 100 MG Tablet 1 tablet Orally Once a day  Taking Tums 500 MG Tablet Chewable 1 tablet as needed for GI  upset Orally Four times a day  Taking Vitamin D3 2000 UNIT Tablet 1 tablet Orally Once a day  Taking Zoloft 100 MG Tablet 1.5 tablet in morning Orally Once a  day  Not-Taking/PRN Citalopram Hydrobromide 20 MG Tablet 1 tablet  Orally Once a day  Not-Taking/PRN Tylenol 325 MG Tablet 2 tablets as needed Orally  every 6 hrs  Medication List reviewed and reconciled with the patient  .  PAST MEDICAL HISTORY  .  Kidney Stones  Chronic Depression  Anxiety  Post Tramatic Stress Disorder  MRSA left nostril (flares up occasionally)  .  ALLERGIES  .  sulfa  penicillin  latex  dark dye  .  SURGICAL HISTORY  .  Appendectomy  Percutaneous diskectomy L4-5  L4-5 laminectomy and TLIF with bilateral L4-5 pedicle screw  fixation 10/02/2015  .  FAMILY HISTORY  .  Mother: deceased  Father: deceased  5 sister(s) .  Marland Kitchen  SOCIAL HISTORY  .  .  Tobaccohistory:Never smoked.  .  Work/Occupation: unemployed, SSDI.  Marland Kitchen  HOSPITALIZATION/MAJOR DIAGNOSTIC PROCEDURE  .  see surgeries  .  VITAL SIGNS  .  Pain scale 5, Ht-in 5'1", Wt-lbs 130 per pt, BP 127/69, RR 63.  .  PHYSICAL EXAMINATION  .  NEUROSURGERY:  MOTOR Lower Extremities  : 5/5 strength in the bilateral lower  extremities. :5/5 strength in the bilateral lower extremities.  SENSATION  : Intact to light touch in the bilateral lower  extremities Reports slightly altered sensation to L thigh vs R  thigh. Slightly altered sensation to the right foot.. :Intact to  light touch in the bilateral lower extremities Reports slightly  altered sensation to L thigh vs R thigh. Slightly altered  sensation to the right foot. . Reflexes  Left Knee Jerk 2+, Right  Knee Jerk 2+, Left Ankle Jerk 1+, Right Ankle Jerk 1+. Right  Ankle Jerk1+ , Left Ankle Jerk1+ , Right Knee Jerk2+ , Left Knee  Jerk2+. Gait  : WNL. :WNL. Babinski  : Negative. :Negative. Ankle  Clonus  : Negative.  :Negative.  DIAGNOSTIC STUDIES:  RADIOLOGY  I reviewed xrays of the lumbar spine completed 03/04/17  at [facility]. .  .  ASSESSMENTS  .  Low back pain - M54.5 (Primary)  .  Reviewing patient's xrays from today there is no instability  noted and the hardware appears to be well positioned. I believe  that patient's current symptoms are muscular in nature. The  patient reports that her lower back pain is worse with bending  and reaching. The patient should review her current physical  exercise plan and determine if there is any way to change this to  improve her pain. She should also consider acupuncture.  .  FOLLOW UP  .  prn  .  Electronically signed by Glendell Docker , MD on  03/04/2017 at 05:37 PM EDT  .  Document electronically signed by Glendell Docker    .

## 2017-03-04 NOTE — Progress Notes (Signed)
* * *        **[name]    --- ---    61 Y old Female, DOB: 1956-02-10, External MRN: [id number]    Account Number: [id number]    [address], [place], [place]    Home: [phone]    Insurance: [id number] [employer] [place] [employer]    PCP: Stacey Drain, MD Referring: Stacey Drain, MD    Appointment Facility: Neurosurgery        * * *    03/04/2017  Progress Notes: Glendell Docker, MD **CHN#:** [id number]    --- ---    ---        Reason for Appointment    ---      1\. Lower back pain and left leg pain    ---      History of Present Illness    ---     _NEUROSURGERY_ :    61 year old female presents s/p L4-5 decompression and fusion for spinal  stenosis and spondylolisthesis in December 2016. She complains of continued  low back pain which is most often constant but can be intermittent; also  reports stiffness, weakness with walking or any strenuous activity. Patient  reports that she has the stiffness and weakness with walking especially when  getting out of the chair. Reports that when she puts weight on the L leg there  is a zinging sensation that radiates down the L buttock to the lateral thigh.  She reports improved ability to roll over in bed or side to side.    Lower back pain radiates occasionally down to feet, which presents as tingling  and numbness in the bilateral feet. Reports that pain is triggered with  partial bending and/or reaching; pushing/pulling in certain positions also  triggers the pain. Both legs are effected equally with pain. Patient reports  that her lower back pain is usually worse than the leg pain.    Patient is currently taking Advil PRN for the pain; uses ice packs for pain.  Patient has done PT in the past but is not currently doing PT. Patient does  not want an ESI at this time but is considering acupuncture.      Current Medications    ---    Taking     * Iron 325 (65 Fe) MG Tablet 1 tablet Orally Once a day    ---    * Mupirocin Ointment 1 application in  nostril as needed for MRSA flareup Externally as directed    ---    * Nystatin 100000 UNIT/GM Ointment 1 application to affected area as needed Externally     ---    * Omeprazole 40 MG Capsule Delayed Release 1 capsule. two days on, one day off Orally as directed    ---    * Seroquel 100 MG Tablet 1 tablet Orally Once a day    ---    * Tums 500 MG Tablet Chewable 1 tablet as needed for GI upset Orally Four times a day    ---    * Vitamin D3 2000 UNIT Tablet 1 tablet Orally Once a day    ---    * Zoloft 100 MG Tablet 1.5 tablet in morning Orally Once a day    ---    Not-Taking/PRN    * Citalopram Hydrobromide 20 MG Tablet 1 tablet Orally Once a day    ---    * Tylenol 325 MG Tablet 2  tablets as needed Orally every 6 hrs    ---    * Medication List reviewed and reconciled with the patient    ---      Past Medical History    ---       Kidney Stones .        ---    Chronic Depression .        ---    Anxiety.        ---    Post Tramatic Stress Disorder .        ---    MRSA left nostril (flares up occasionally).        ---      Surgical History    ---      Appendectomy    ---    Percutaneous diskectomy L4-5    ---    L4-5 laminectomy and TLIF with bilateral L4-5 pedicle screw fixation 10/02/2015    ---      Family History    ---      Mother: deceased    ---    Father: deceased    ---    5 sister(s) .    ---      Social History    ---    Tobacco  history: Never smoked.    Work/Occupation: unemployed, Sport and exercise psychologist.      Allergies    ---      sulfa    ---    penicillin    ---    latex    ---    dark dye    ---      Hospitalization/Major Diagnostic Procedure    ---      see surgeries    ---      Vital Signs    ---    Pain scale 5, Ht-in 5'1", Wt-lbs 130 per pt, BP 127/69, RR 63.      Physical Examination    ---     _NEUROSURGERY_ :    MOTOR Lower Extremities : 5/5 strength in the bilateral lower extremities.  SENSATION : Intact to light touch in the bilateral lower extremities Reports  slightly altered sensation to L thigh vs R thigh.  Slightly altered sensation  to the right foot. . Reflexes Right Ankle Jerk 1+, Left Ankle Jerk 1+, Right  Knee Jerk 2+, Left Knee Jerk 2+. Gait : WNL. Babinski : Negative. Ankle Clonus  : Negative.    _DIAGNOSTIC STUDIES_ :    RADIOLOGY  I reviewed xrays of the lumbar spine completed 03/04/17 at [facility]. .          Assessments    ---    1\. Low back pain - M54.5 (Primary)    ---      Reviewing patient's xrays from today there is no instability noted and the  hardware appears to be well positioned. I believe that patient's current  symptoms are muscular in nature. The patient reports that her lower back pain  is worse with bending and reaching. The patient should review her current  physical exercise plan and determine if there is any way to change this to  improve her pain. She should also consider acupuncture.    ---      Follow Up    ---    prn    Electronically signed by Glendell Docker , MD on 03/04/2017 at 05:37 PM EDT    Sign off status: Completed        * * *  Neurosurgery    [address]    [place], [place] [address]    Tel: [phone]    Fax: [phone]              * * *          Patient: [name], [name] DOB: [birth year] Progress Note: Glendell Docker, MD  03/04/2017    ---    Note generated by eClinicalWorks EMR/PM Software ([email or web address])

## 2017-05-05 ENCOUNTER — Telehealth: Payer: Self-pay | Admitting: Internal Medicine

## 2017-05-05 NOTE — Telephone Encounter (Signed)
Pt called in to find out when is she due for her labs, she said that she would like to have a blocka 1 and blocka (not sure of spelling)  2 test completed also. She said that her daughter was just told by her provider that she have it.     Please advise. I will schedule pt    CB: [phone]

## 2017-05-05 NOTE — Telephone Encounter (Signed)
Pt not due for labs until next OV in September which is already scheduled. Genetic testing for BRCA-1 and BRCA-2 can be discussed at that time.

## 2017-05-05 NOTE — Telephone Encounter (Signed)
Called pt and made her aware. She expressed understanding.

## 2017-06-23 ENCOUNTER — Ambulatory Visit: Payer: BLUE CROSS/BLUE SHIELD | Admitting: Internal Medicine

## 2017-07-01 ENCOUNTER — Ambulatory Visit (INDEPENDENT_AMBULATORY_CARE_PROVIDER_SITE_OTHER): Payer: BLUE CROSS/BLUE SHIELD | Admitting: Internal Medicine

## 2017-07-01 ENCOUNTER — Encounter: Payer: Self-pay | Admitting: Internal Medicine

## 2017-07-01 VITALS — BP 126/72 | HR 63 | Temp 97.7°F | Resp 14 | Ht 68.0 in | Wt 183.0 lb

## 2017-07-01 DIAGNOSIS — E6609 Other obesity due to excess calories: Secondary | ICD-10-CM | POA: Diagnosis not present

## 2017-07-01 DIAGNOSIS — Z8481 Family history of carrier of genetic disease: Secondary | ICD-10-CM | POA: Diagnosis not present

## 2017-07-01 DIAGNOSIS — I10 Essential (primary) hypertension: Secondary | ICD-10-CM

## 2017-07-01 LAB — BASIC METABOLIC PANEL
BUN: 13 mg/dL (ref 6–23)
CHLORIDE: 103 meq/L (ref 96–112)
CO2: 28 meq/L (ref 19–32)
Calcium: 9.2 mg/dL (ref 8.4–10.5)
Creatinine, Ser: 0.7 mg/dL (ref 0.40–1.20)
GFR: 90.57 mL/min (ref >60.00)
GLUCOSE: 102 mg/dL — AB (ref 70–99)
POTASSIUM: 4.1 meq/L (ref 3.5–5.1)
SODIUM: 140 meq/L (ref 135–145)

## 2017-07-01 MED ORDER — ALBUTEROL SULFATE HFA 108 (90 BASE) MCG/ACT IN AERS: 2.0000 | INHALATION_SPRAY | RESPIRATORY_TRACT | 1 refills | Status: DC | PRN

## 2017-07-01 MED ORDER — AZELASTINE HCL 0.1 % NA SOLN: 2.0000 | Freq: Every day | NASAL | 6 refills | Status: DC

## 2017-07-01 NOTE — Progress Notes (Signed)
Subjective:    Patient ID: Sheila Fuller, female    DOB: September 28, 1956, 61 y.o.   MRN: [id number]  DOS:  07/01/2017 Type of visit - description : rov Interval history: Obesity: Stopped SAXENDA a few months ago, no side effects, she got a little concerned about feeling dizzy and simply decided to stop. Appetite has increased, not exercising much. Asthma: Well controlled, uses albuterol as needed, needs a refill. HTN: Good compliance of medication, ambulatory BP slightly elevated 2. Patient concerned about it Allergies: Rarely take Flonase, was recommended by her ophthalmologist not to use it. She uses a OTC decongestion from time to time. DJD: Not taking Motrin, well-controlled with Tylenol. Daughter diagnosed with + braca   Wt Readings from Last 3 Encounters:  07/01/17 183 lb (83 kg)  12/23/16 171 lb 8 oz (77.8 kg)  10/13/16 177 lb 8 oz (80.5 kg)    BP Readings from Last 3 Encounters:  07/01/17 126/72  12/23/16 126/74  10/13/16 124/76     Review of Systems   Past Medical History:  Diagnosis Date  . Allergic rhinitis   . Allergy   . Anxiety   . Arthritis   . Asthma    seasonally  . Cancer ([place])    skin  . 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   . Cataract    will remove left end of 10-2016  . Depression   . GERD (gastroesophageal reflux disease)    takes zantac maybe 2/week  . Hyperlipidemia   . Lactose intolerance 10/04/2013  . Microhematuria 01/2006   saw Dr.Peterson, renal u/s: r pyelocaliectasis but CT was neg, cysto neg  . Shortness of breath    only with asthma flare up  . Sleep apnea    rarely uses cpap    Past Surgical History:  Procedure Laterality Date  . ANTERIOR CERVICAL DECOMP/DISCECTOMY FUSION N/A 08/23/2014   Procedure: C5-6 Anterior Cervical Discectomy and Fusion, Allograft, Plate;  Surgeon: Marybelle Killings, MD;  Location: [facility];  Service: Orthopedics;  Laterality: N/A;  . cervical cauterization      . CESAREAN SECTION    . COLONOSCOPY    . GANGLION CYST EXCISION    . NASAL SEPTUM SURGERY    . POLYPECTOMY      Social History   Social History  . Marital status: Married    Spouse name: N/A  . Number of children: 1  . 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 Smoking status: Former Smoker    Packs/day: 0.30    Years: 3.00    Types: Cigarettes  . Smokeless tobacco: Never Used     Comment: quit 04-2013 ([facility])  . Alcohol use 1.8 oz/week    3 Standard drinks or equivalent per week     Comment: a week   . Drug use: No  . Sexual activity: Yes   Other Topics Concern  . Not on file   Social History Narrative   remarried 10-11, 1 child      Allergies as of 07/01/2017      Reactions   Statins    REACTION: myalgia   Sulfonamide Derivatives Rash      Medication List       Accurate as of 07/01/17 10:41 AM. Always use your most recent med list.  albuterol 108 (90 Base) MCG/ACT inhaler Commonly known as:  PROVENTIL HFA;VENTOLIN HFA Inhale 2 puffs by mouth four times a day if needed.   cyclobenzaprine 5 MG tablet Commonly known as:  FLEXERIL Take 1 tablet (5 mg total) by mouth 2 (two) times daily as needed for muscle spasms.   desloratadine 5 MG tablet Commonly known as:  CLARINEX   FLUoxetine 20 MG capsule Commonly known as:  PROZAC Take 1 capsule (20 mg total) by mouth daily.   fluticasone 50 MCG/ACT nasal spray Commonly known as:  FLONASE Place 2 sprays into both nostrils daily.   ibuprofen 800 MG tablet Commonly known as:  ADVIL,MOTRIN take 1 tablet by mouth every 6 to 8 hours if needed for pain   Insulin Pen Needle 32G X 6 MM Misc To use w/ Saxenda   Liraglutide -Weight Management 18 MG/3ML Sopn Commonly known as:  SAXENDA Inject 3 mg into the skin daily. Use dosing guidelines as given by MD.   losartan 100 MG tablet Commonly known as:  COZAAR Take 1 tablet  (100 mg total) by mouth daily.   MUCINEX SINUS-MAX PO Take by mouth 4 (four) times daily as needed.   PAZEO 0.7 % Soln Generic drug:  Olopatadine HCl   phenylephrine 10 MG Tabs tablet Commonly known as:  SUDAFED PE Take 10 mg by mouth every 4 (four) hours as needed.   ranitidine 75 MG tablet Commonly known as:  ZANTAC Take 75 mg by mouth at bedtime.          Objective:   Physical Exam BP 126/72 (BP Location: Left Arm, Patient Position: Sitting, Cuff Size: Normal)   Pulse 63   Temp 97.7 F (36.5 C) (Oral)   Resp 14   Ht '5\' 8"'  (1.727 m)   Wt 183 lb (83 kg)   SpO2 97%   BMI 27.83 kg/m  General:   Well developed, well nourished . NAD.  HEENT:  Normocephalic . Face symmetric, atraumatic Lungs:  CTA B Normal respiratory effort, no intercostal retractions, no accessory muscle use. Heart: RRR,  no murmur.  No pretibial edema bilaterally  Skin: Not pale. Not jaundice Neurologic:  alert & oriented X3.  Speech normal, gait appropriate for age and unassisted Psych--  Cognition and judgment appear intact.  Cooperative with normal attention span and concentration.  Behavior appropriate. No anxious or depressed appearing.      Assessment & Plan:  Assessment HTN Hyperlipidemia Anxiety, depression GERD Seasonal asthma Allergies Obesity : BMI~ 28 plus HTN, hyperlipidemia, OSA OSA: Uses CPAP irregularly MSK: DJD, occasionally uses pain medication H/o Carcinoma in situ, perianal skin, XRT, chemotherapy 2000. Not further follow-ups with specialist H/o Microhematuria 2007, Dr. Terance Hart, renal US showed right pyelocaliectasis, CT was negative, cystoscopy negative + FH BRCA (daughter)  PLAN:  HTN: Good compliance with losartan. Amb  BP elevated 2, BPs here are always wnl thus rec to continue the same medications, check a BMP, watch salt intake, increase exercise and monitor BPs. Obesity: Self discontinue SAXENDA, did not have s/e, she got a little concerned about feeling  dizzy and simply decided to stop. She has gained some weight. We talk about going back on that medication noting that she will have to take it for the foreseeable future. Patient elected to simply watch her diet more closely and increase her exercise. Allergies: Recommend to avoid Sudafed as it may raise her blood pressure, was recommended by ophthalmology not to use Flonase consequently I will prescribe Astelin. Asthma: Refill albuterol. +  BRCA: Daughter tested positive, patient likes to be tested. Current literature rec referral to a geneticists. Will arrange a referral  RTC 3 months

## 2017-07-01 NOTE — Assessment & Plan Note (Signed)
HTN: Good compliance with losartan. Amb  BP elevated 2, BPs here are always wnl thus rec to continue the same medications, check a BMP, watch salt intake, increase exercise and monitor BPs. Obesity: Self discontinue SAXENDA, did not have s/e, she got a little concerned about feeling dizzy and simply decided to stop. She has gained some weight. We talk about going back on that medication noting that she will have to take it for the foreseeable future. Patient elected to simply watch her diet more closely and increase her exercise. Allergies: Recommend to avoid Sudafed as it may raise her blood pressure, was recommended by ophthalmology not to use Flonase consequently I will prescribe Astelin. Asthma: Refill albuterol. + BRCA: Daughter tested positive, patient likes to be tested. Current literature recommend a referral to a geneticists. Will arrange a referral  RTC 3 months

## 2017-07-01 NOTE — Progress Notes (Signed)
Pre visit review using our clinic review tool, if applicable. No additional management support is needed unless otherwise documented below in the visit note. 

## 2017-07-01 NOTE — Patient Instructions (Signed)
GO TO THE LAB : Get the blood work     GO TO THE FRONT DESK Schedule your next appointment for a  physical exam in 3 months, fasting  Stop SAXENDA  Avoid decongestants if possible  Start Astelin 2 sprays on each side of the nose every night for allergies  Check the  blood pressure 2 or 3 times a  Week  Be sure your blood pressure is between 110/65 and  145/85. If it is consistently higher or lower, let me know

## 2017-07-02 ENCOUNTER — Encounter: Payer: Self-pay | Admitting: Internal Medicine

## 2017-07-02 ENCOUNTER — Telehealth: Payer: Self-pay

## 2017-07-02 NOTE — Telephone Encounter (Signed)
LMOM informing of below. Instructed to call if questions/concerns. Referral placed.

## 2017-07-02 NOTE — Telephone Encounter (Signed)
-----  Message from Colon Branch, MD sent at 07/01/2017  6:54 PM EDT ----- Regarding: Enter a geneticists referral, DX + FH BRCA Tell the patient that after I review the issue, a referral is recommended not simply testing.

## 2017-07-14 ENCOUNTER — Other Ambulatory Visit: Payer: Self-pay | Admitting: Internal Medicine

## 2017-07-23 ENCOUNTER — Encounter: Payer: Self-pay | Admitting: Internal Medicine

## 2017-07-23 DIAGNOSIS — Z23 Encounter for immunization: Secondary | ICD-10-CM | POA: Diagnosis not present

## 2017-07-23 DIAGNOSIS — L237 Allergic contact dermatitis due to plants, except food: Secondary | ICD-10-CM | POA: Diagnosis not present

## 2017-08-03 ENCOUNTER — Telehealth: Payer: Self-pay | Admitting: Genetics

## 2017-08-03 NOTE — Telephone Encounter (Signed)
Genetic counseling appt has been scheduled for the pt to see Ferol Luz on 10/9 at 10am. Pt aware to arrive 15 minutes early. Address and insurance verified.

## 2017-08-04 ENCOUNTER — Other Ambulatory Visit: Payer: BLUE CROSS/BLUE SHIELD

## 2017-08-04 ENCOUNTER — Ambulatory Visit (HOSPITAL_BASED_OUTPATIENT_CLINIC_OR_DEPARTMENT_OTHER): Payer: BLUE CROSS/BLUE SHIELD | Admitting: Genetics

## 2017-08-04 ENCOUNTER — Encounter: Payer: Self-pay | Admitting: Genetics

## 2017-08-04 DIAGNOSIS — Z800 Family history of malignant neoplasm of digestive organs: Secondary | ICD-10-CM | POA: Diagnosis not present

## 2017-08-04 DIAGNOSIS — Z803 Family history of malignant neoplasm of breast: Secondary | ICD-10-CM

## 2017-08-04 DIAGNOSIS — Z8481 Family history of carrier of genetic disease: Secondary | ICD-10-CM | POA: Diagnosis not present

## 2017-08-04 DIAGNOSIS — Z808 Family history of malignant neoplasm of other organs or systems: Secondary | ICD-10-CM | POA: Diagnosis not present

## 2017-08-04 NOTE — Progress Notes (Signed)
REFERRING PROVIDER: Colon Branch, MD [address] [place], [place] [address]  PRIMARY PROVIDER:  Colon Branch, MD  PRIMARY REASON FOR VISIT:  1. Family history of BRCA gene mutation   2. Family history of breast cancer   3. Family history of pancreatic cancer      HISTORY OF PRESENT ILLNESS:   Sheila Fuller, a 61 y.o. female, was seen for a [facility] cancer genetics consultation at the request of Dr. Larose Kells due to her daughter having a BRCA1 mutation.   Sheila Fuller presents to clinic today to discuss the possibility of a hereditary predisposition to cancer, genetic testing, and to further clarify her future cancer risks, as well as potential cancer risks for family members.   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 She received chemotherapy and radiation for this cancer.  Sheila Fuller has a Lipoma on her back.    HORMONAL RISK FACTORS:  Menarche was at age 36 First live birth at age 6.  OCP use for approximately 5 years.  Ovaries intact: yes. Damage to ovaries due to radiation therapy Hysterectomy: no.  Menopausal status: postmenopausal. LMP at 43 HRT use: 2 years. Stopped more than 5 years ago Colonoscopy: yes; "started before 51"  path reports reviewed: 2007- 1 tubulovillus aden. 2012-1 tubular aden, 1 Sessile serrated adenoma, 1 hyperplastic, 2017-4 polyps total (tubular adenoma and hyperplastic). Mammogram within the last year: yes. Number of breast biopsies: 0. Former Smoker  Past Medical History:  Diagnosis Date  . Allergic rhinitis   . Allergy   . Anxiety   . Arthritis   . Asthma    seasonally  . Cancer ([place])    skin  . 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   . Cataract    will remove left end of 10-2016  . Depression   . Family history of BRCA gene mutation    daughter BRCA1 +  . Family history of breast cancer   . Family history of pancreatic cancer   .  GERD (gastroesophageal reflux disease)    takes zantac maybe 2/week  . Hyperlipidemia   . Lactose intolerance 10/04/2013  . Microhematuria 01/2006   saw Dr.Peterson, renal u/s: r pyelocaliectasis but CT was neg, cysto neg  . Shortness of breath    only with asthma flare up  . Sleep apnea    rarely uses cpap    Past Surgical History:  Procedure Laterality Date  . ANTERIOR CERVICAL DECOMP/DISCECTOMY FUSION N/A 08/23/2014   Procedure: C5-6 Anterior Cervical Discectomy and Fusion, Allograft, Plate;  Surgeon: Marybelle Killings, MD;  Location: [facility];  Service: Orthopedics;  Laterality: N/A;  . cervical cauterization    . CESAREAN SECTION    . COLONOSCOPY    . GANGLION CYST EXCISION    . NASAL SEPTUM SURGERY    . POLYPECTOMY      Social History   Social History  . Marital status: Married    Spouse name: N/A  . Number of children: 1  . 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 Smoking status: Former Smoker    Packs/day: 0.30    Years: 3.00    Types: Cigarettes  . Smokeless tobacco: Never Used     Comment: quit 04-2013 ([facility])  .  Alcohol use 1.8 oz/week    3 Standard drinks or equivalent per week     Comment: a week   . Drug use: No  . Sexual activity: Yes   Other Topics Concern  . None   Social History Narrative   remarried 10-11, 1 child     FAMILY HISTORY:  We obtained a detailed, 4-generation family history.  Significant diagnoses are listed below: Family History  Problem Relation Age of Onset  . Lung cancer Father 8       hx smoking  . Diabetes Mother        late onset  . Hyperlipidemia Mother   . Cancer Mother 12       metholosmia  . Cervical cancer Sister 81  . Cancer Maternal Aunt 60       colon/rectal  . Cancer Paternal Uncle        lung- hx smoking , died 85's  . BRCA 1/2 Daughter        had prophlactic bilat mastectomy  . Esophageal cancer Brother 45  . Breast cancer  Paternal Aunt 3  . Breast cancer Paternal Aunt 62  . Lung cancer Paternal Uncle   . Pancreatic cancer Paternal Uncle 79  . Cancer Cousin        type unk, dx <50  . Coronary artery disease Neg Hx   . Hypertension Neg Hx   . Stroke Neg Hx   . Sudden death Neg Hx   . Heart attack Neg Hx   . Colon polyps Neg Hx   . Rectal cancer Neg Hx   . Stomach cancer Neg Hx    Sheila Fuller has 1 daughter who is 39 and had a BRCA1 mutation identified on genetic testing.  This test report was unavailable for review, but she will send it if she can find this report.   Sheila Fuller has 1 full sister, 1 paternal half-sister and 1 brother listed below: -1 sister was diagnosed with cervical cancer at 42, she has no children.   -1 brother is 3 and has recently undergone treatment for esophageal cancer. H has 2 daughters who have no history of cancer.  -1 paternal half-sister is 64 with no history of cancer, she has 4 children with no history of cancer.   Sheila Fuller' father died at 64 due to lung cancer.  He had a history of smoking.  Sheila Fuller has 2 paternal uncles who both had lung cancer in their 73's.  They had a history of smoking.  Sheila Fuller has 3 paternal half-aunts/uncles (through her PGM).  2 of these half-aunts had breast cancer in their 60's.  Her paternal half-uncle died of pancreatic cancer in his 74's.  Sheila Fuller has several paternal cousins, none with any history of cancer she is aware of .  Ms. An' paternal grandfather died older than 45 in an accident.  Sheila Fuller' paternal grandmother died in her 40's due to age related illness.    Sheila Fuller' mother died of mesothelioma at 33.  She had her uterus intact and had her ovaries removed at 70 for treatment of her cancer.  Sheila Fuller had 5 maternal aunts/uncles.  One maternal aunt had colon/rectal cancer at 17 and lived into her 74's.  Ms. Cordy does not have a lot of information about her other maternal aunts/uncles, no history of cancer that she is  aware of. Ms. Boerner has 1 maternal cousin who had cancer young (<50) but she does not know  what type of cancer this was.  Sheila Fuller does not know any information about her maternal grandfather and knows that her maternal grandmother died younger than 9, but the cause is unknown (does not think it was cancer).    Ms. Tobia is aware of previous family history of genetic testing in her daugher that revealed a BRCA1 mutation. Patient's maternal ancestors are of [place] descent, and paternal ancestors are of [place] descent. There is no reported Ashkenazi Jewish ancestry. There is no known consanguinity.  GENETIC COUNSELING ASSESSMENT: YANELLE SOUSA is a 61 y.o. female with a daughter who has a BRCA1 mutation.   We, therefore, discussed and recommended the following at today's visit.   DISCUSSION: We reviewed the characteristics, features and inheritance patterns of hereditary cancer syndromes. We also discussed genetic testing, including the appropriate family members to test, the process of testing, insurance coverage and turn-around-time for results. We discussed the implications of a negative, positive and/or variant of uncertain significant result. We recommended Ms. Vandeusen pursue genetic testing for the Common hereditary Cancer gene panel + EGFR lung cancer mutation. The Hereditary Gene Panel offered by [facility] includes sequencing and/or deletion duplication testing of the following 46 genes: APC, ATM, AXIN2, BARD1, BMPR1A, BRCA1, BRCA2, BRIP1, CDH1, CDKN2A (p14ARF), CDKN2A (p16INK4a), CHEK2, CTNNA1, CDK4, DICER1, EPCAM (Deletion/duplication testing only), GREM1 (promoter region deletion/duplication testing only), KIT, MEN1, MLH1, MSH2, MSH3, MSH6, MUTYH, NBN, NF1, NHTL1, PALB2, PDGFRA, PMS2, POLD1, POLE, PTEN, RAD50, RAD51C, RAD51D, SDHB, SDHC, SDHD, SMAD4, SMARCA4. STK11, TP53, TSC1, TSC2, and VHL.  The following genes were evaluated for sequence changes only: SDHA and HOXB13 c.251G>A variant only.    Hereditary Breast and Ovarian Cancer (HBOC) syndrome is caused by mutations in the BRCA1 and BRCA2 genes.  This syndrome increases an individual's lifetime risk to develop breast, ovarian, pancreatic, and other types of cancer.  There are also many other cancer predisposition syndromes caused by mutations in several other genes.  We discussed that if she is found to have a mutation in one of these genes, it may impact future medical management recommendations such as increased cancer screenings and consideration of risk reducing surgeries.  A positive result could also have implications for the patient's family members.  A Negative result would mean her daughter did not get the BRCA1 mutation from Ms. Buescher.  It cannot completely rule out the possibility of a hereditary basis for her or her family's cancer.  There could be mutations that are undetectable by current technology, or in genes not yet tested or identified to increase cancer risk.  A negative result would also mean that her daughter most likely inherited the BRCA1 mutation from her father and we recommend he and his family have genetic testing.    We discussed the potential to find a Variant of Uncertain Significance or VUS.  Josefine Class are variants that have not yet been identified as pathogenic or benign, and it is unknown if this variant is associated with increased cancer risk or if this is a normal finding.  Most VUS's are reclassified to benign or likely benign.   It should not be used to make medical management decisions. With time, we suspect the lab will determine the significance of any VUS's identified if any.  If we can get a copy of her daughter's report we can provide a yes or no answer regarding if she carries her daughter's specific BRCA1 mutation, however there could still be a VUS identified on the gene panel.  Based on Ms. Dolby's family history of cancer, she meets medical criteria for genetic testing. Despite that she  meets criteria, she may still have an out of pocket cost. We discussed that if her out of pocket cost for testing is over $100, the laboratory will call and confirm whether she wants to proceed with testing.  If the out of pocket cost of testing is less than $100 she will be billed by the genetic testing laboratory.   We discussed that some people do not want to undergo genetic testing due to fear of genetic discrimination.  A federal law called the Genetic Information Non-Discrimination Act (GINA) of 2008 helps protect individuals against genetic discrimination based on their genetic test results.  It impacts both health insurance and employment.  For health insurance, it protects against increased premiums, being kicked off insurance or being forced to take a test in order to be insured.  For employment it protects against hiring, firing and promoting decisions based on genetic test results.  Health status due to a cancer diagnosis is not protected under GINA.  This law does not protect life insurance, disability insurance, or other types of insurance.   PLAN: After considering the risks, benefits, and limitations, Ms. Clasby  provided informed consent to pursue genetic testing and the blood sample was sent to [facility] for analysis of the Common Hereditary Cancer Panel + EGFR lung cancer mutation. Results should be available within approximately 2-3 weeks' time, at which point they will be disclosed by telephone to Ms. Ogren, as will any additional recommendations warranted by these results. Ms. Lenig will receive a summary of her genetic counseling visit and a copy of her results once available. This information will also be available in Epic. We encouraged Ms. Geisler to remain in contact with cancer genetics annually so that we can continuously update the family history and inform her of any changes in cancer genetics and testing that may be of benefit for her family. Ms. Auzenne questions  were answered to her satisfaction today. Our contact information was provided should additional questions or concerns arise.  Lastly, we encouraged Ms. Auld to remain in contact with cancer genetics annually so that we can continuously update the family history and inform her of any changes in cancer genetics and testing that may be of benefit for this family.   Ms.  Hobart questions were answered to her satisfaction today. Our contact information was provided should additional questions or concerns arise. Thank you for the referral and allowing [place] to share in the care of your patient.  She plans to send [place] a copy of her daughter's genetic test report.   Tana Felts, MS Genetic Counselor [email or web address] phone: [phone]  The patient was seen for a total of 40 minutes in face-to-face genetic counseling.

## 2017-08-10 ENCOUNTER — Encounter: Payer: Self-pay | Admitting: Internal Medicine

## 2017-08-10 MED ORDER — AMLODIPINE BESYLATE 10 MG PO TABS: 10.0000 mg | ORAL_TABLET | Freq: Every day | ORAL | 6 refills | Status: DC

## 2017-08-10 NOTE — Telephone Encounter (Signed)
Rx sent 

## 2017-08-10 NOTE — Telephone Encounter (Signed)
Send amlodipine 10 mg 1 by mouth daily #30 and 6 refills  Received: Today  Message Contents  Colon Branch, MD  Damita Dunnings, [place]

## 2017-08-18 ENCOUNTER — Telehealth: Payer: Self-pay | Admitting: Genetics

## 2017-08-19 ENCOUNTER — Ambulatory Visit (HOSPITAL_BASED_OUTPATIENT_CLINIC_OR_DEPARTMENT_OTHER): Payer: BLUE CROSS/BLUE SHIELD | Admitting: Genetics

## 2017-08-19 DIAGNOSIS — Z8049 Family history of malignant neoplasm of other genital organs: Secondary | ICD-10-CM | POA: Diagnosis not present

## 2017-08-19 DIAGNOSIS — Z8481 Family history of carrier of genetic disease: Secondary | ICD-10-CM | POA: Diagnosis not present

## 2017-08-19 DIAGNOSIS — Z801 Family history of malignant neoplasm of trachea, bronchus and lung: Secondary | ICD-10-CM | POA: Diagnosis not present

## 2017-08-19 DIAGNOSIS — Z800 Family history of malignant neoplasm of digestive organs: Secondary | ICD-10-CM | POA: Diagnosis not present

## 2017-08-19 DIAGNOSIS — Z1509 Genetic susceptibility to other malignant neoplasm: Secondary | ICD-10-CM

## 2017-08-19 DIAGNOSIS — Z803 Family history of malignant neoplasm of breast: Secondary | ICD-10-CM | POA: Diagnosis not present

## 2017-08-19 DIAGNOSIS — Z1501 Genetic susceptibility to malignant neoplasm of breast: Secondary | ICD-10-CM

## 2017-08-19 NOTE — Telephone Encounter (Signed)
Pt. 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  The patient is scheduled to come in to discuss her results further tomorrow 40/24/2018 at 4:00PM.  I explained that she is at an increased risk to develop breast, ovarian, and possibly other types of cancer and we can discuss screening and surgical options.  We also discussed the possibility of referring her to the high-risk clinic with one of the oncologists at the cancer center who see patients with genetic mutations who are at high risk for cancer.

## 2017-08-19 NOTE — Progress Notes (Addendum)
GENETIC TEST RESULTS   Patient Name: Sheila Fuller Patient Age: 61 y.o. Encounter Date: 08/19/2017  Referring Provider: Kathlene November, MD  Ms. Graffeo was seen in the [facility] on 08/04/2017 due to a family history of cancer and a BRCA1 mutation being identified in her daughter.   Please refer to the prior Genetics clinic note for more information regarding Ms. Mancil's medical and family histories and our assessment at the time.   FAMILY HISTORY:  We obtained a detailed, 4-generation family history.  Significant diagnoses are listed below: Family History  Problem Relation Age of Onset  . Lung cancer Father 52       hx smoking  . Diabetes Mother        late onset  . Hyperlipidemia Mother   . Cancer Mother 1       metholosmia  . Cervical cancer Sister 52  . Cancer Maternal Aunt 60       colon/rectal  . Cancer Paternal Uncle        lung- hx smoking , died 45's  . BRCA 1/2 Daughter        had prophlactic bilat mastectomy  . Esophageal cancer Brother 55  . Breast cancer Paternal Aunt 46  . Breast cancer Paternal Aunt 1  . Lung cancer Paternal Uncle   . Pancreatic cancer Paternal Uncle 80  . Cancer Cousin        type unk, dx <50  . Coronary artery disease Neg Hx   . Hypertension Neg Hx   . Stroke Neg Hx   . Sudden death Neg Hx   . Heart attack Neg Hx   . Colon polyps Neg Hx   . Rectal cancer Neg Hx   . Stomach cancer Neg Hx    Ms. Stroud' father died at 31 due to lung cancer.  He had a history of smoking.  Ms. Crespi has 2 paternal uncles who both had lung cancer in their 57's.  They had a history of smoking.  Ms. Reddick has 3 paternal half-aunts/uncles (through her PGM).  2 of these half-aunts had breast cancer in their 60's.  Her paternal half-uncle died of pancreatic cancer in his 58's.  Ms. Redditt has several paternal cousins, none with any history of cancer she is aware of .  Ms. Terry' paternal grandfather died older than 53 in an accident.  Ms. Aida Puffer'  paternal grandmother died in her 33's due to age related illness.    Ms. Bady' mother died of mesothelioma at 73.  She had her uterus intact and had her ovaries removed at 4 for treatment of her cancer.  Ms. Aida Puffer had 5 maternal aunts/uncles.  One maternal aunt had colon/rectal cancer at 55 and lived into her 13's.  Ms. Weissberg does not have a lot of information about her other maternal aunts/uncles, no history of cancer that she is aware of. Ms. Maruyama has 1 maternal cousin who had cancer young (<50) but she does not know what type of cancer this was.  Ms. Leon does not know any information about her maternal grandfather and knows that her maternal grandmother died younger than 53, but the cause is unknown (does not think it was cancer).    Ms. Humphries is aware of previous family history of genetic testing in her daugher that revealed a BRCA1 mutation. Patient's maternal ancestors are of [place] descent, and paternal ancestors are of [place] descent. There is no reported Ashkenazi Jewish ancestry. There is no known consanguinity.  GENETIC TESTING:  At the time of Ms. Kuhnert's visit, we recommended she pursue genetic testing of the [facility] Common Hereditary Cancer Panel panel + EGFR + BAP1. The following genes were evaluated for sequence changes and exonic deletions/duplications:APC, ATM, AXIN2, BAP1, BARD1, BMPR1A, BRCA1, BRCA2, BRIP1, CDH1, CDK4, CDKN2A (p14ARF), CDKN2A (p16INK4a), CHEK2,CTNNA1, DICER1, EPCAM*, GREM1*, KIT, MEN1, MLH1, MSH2, MSH3, MSH6, MUTYH, NBN, NF1, PALB2, PDGFRA, PMS2, POLD1,POLE, PTEN, RAD50, RAD51C, RAD51D, SDHB, SDHC, SDHD, SMAD4, SMARCA4, STK11, TP53, TSC1, TSC2, VHL.  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 c.5369T>G (p.Val1790Gly)  I do recommend that Ms. Jacquot' daughter's genetic testing report be compared to this result to confirm that the same specific mutation that was identified in Ms. Nobles' daughter is the same one identified in Ms. Worthey.      Regarding the VUS in TSC2: At this time, it is unknown if this variant is associated with increased cancer risk or if this is a normal finding, but most variants such as this get reclassified to being inconsequential. It should not be used to make medical management decisions. With time, we suspect the lab will determine the significance of this variant, if any. If we do learn more about it, we will try to contact Ms. Maceachern to discuss it further. However, it is important to stay in touch with [place] periodically and keep the address and phone number up to date.  MEDICAL MANAGEMENT: Women who have a BRCA mutation have an increased risk for both breast, ovarian, and other types of cancer.  As discussed with Ms. Preisler, to reduce the risk for breast cancer, prophylactic bilateral mastectomy is the most effective option. However, for women who choose to keep their breasts, we recommend yearly mammograms, yearly breast MRI, twice-yearly clinical breast exams through a high-risk clinic, and monthly self-breast exams.  The option to speak with a breast and plastic surgeon is available to Ms. Burditt.   To reduce the risk for ovarian cancer, we recommend Ms. Belgarde  have a prophylactic bilateral salpingo-oophorectomy. We discussed that screening with CA-125 blood tests and transvaginal ultrasounds can be done twice per year. However, these tests have not been shown to detect ovarian cancer at an early stage.  She is interested in having this surgical procedure performed.  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    RISK REDUCTION: There are several things that can be  offered to individuals who are carriers for BRCA mutations that will reduce the risk for getting cancer.   The use of oral contraceptives can lower the risk for ovarian cancer, and, per case control studies, does not significantly increase the risk for breast cancer in BRCA patients. Case control studies have shown that oral contraceptives can lower the risk for ovarian cancer in women with BRCA mutations. Additionally, a more recent meta-analysis, including one corhort (n=3,181) and one case control study (1,096 cases and 2,878 controls) also showed an inverse correlation between ovarian cancer and ever having used oral contraceptives (OR, 0.58; 95% CI = 0.46-0.73). Studies on oral contraceptives and breast cancer have been conflicting, with some studies suggesting that there is not an increased risk for breast cancer in BRCA mutation carriers, while others  suggest that there could be a risk. That said, two meta-analysis studies have shown that there is not an increased risk for breast cancer with oral contraceptive use in BRCA1 and BRCA2 carriers.   In individuals who have a prophylactic bilateral salpino-oophorectomy (BSO) the risk for breast cancer is reduced by up to 50% in premenopausal women.  It has been reported that short term hormone replacement therapy in women undergoing prophylactic BSO does not negate the reduction of breast cancer risk associated with surgery (1.2018 NCCN guidelines).  There is a somewhat increased risk for pancreatic cancer in individuals with a BRCA1 mutation (2-3% for individuals with a BRCA1 mutations).  There are no specific guidelines for pancreatic cancer screening in individuals with a BRCA1 mutation.  However, given her family history of pancreatic cancer in her paternal half-uncle she may consider pancreatic cancer screening.  This can be performed with endoscopic ultrasound and abdominal MRI.  Ms. Daffron would like to prioritize her breast and ovarian cancer  risks at this time and will let us know if she would like [place] to refer her to [facility] to discuss pancreatic cancer screening further.    There is limited data that suggests there may be a slightly increased risk of serous uterine cancer in women with BRCA1 mutations, however the evidence is insufficient to recommend any additional screening or medical measures.    Ms. Walthers' individual risk was assessed using the risk model BOADICEA.  This model predicts an estimated risk based on a woman's BRCA mutation status, age,  and family history.  The calculations from this risk model are shown below.  This is not an exact calculation but serves as a general estimation to aid in management and decision making.          FAMILY MEMBERS: It is important that all of Ms. Cermak's relatives (both men and women) know of the presence of this gene mutation. Site-specific genetic testing can sort out who in the family is at risk and who is not.   Ms. Michalski siblings have a 50% chance to have inherited this mutation.   We recommend they have genetic testing for this same mutation, as identifying the presence of this mutation would allow them to also take advantage of risk-reducing measures. Her paternal half-sister is only at risk if this mutation was paternally inherited, but should have testing regardless as we do not know at this time weather the BRCA1 mutation was inherited from Ms. Blazina' father or mother.   As discussed above, at this time we do not know if Ms. Failla' BRCA1 mutation was inherited from her mother or her father's side of the family.  Therefore, we recommend relatives on both sides of the family (aunts/uncles, cousins, etc.) have genetic counseling and genetic testing.   PLAN: 1. Ms. Holquin would like to be seen and managed by an oncologist at the [facility] in the high risk clinic.  I will arrange for this appointment to be scheduled.  At this time she is  interested in starting a high risk breast screening plan and would like to start discussing a BSO procedure.   2. Ms. Hofmeister will inform her relatives of this mutation.  She is aware that relatives can have testing of the familial BRCA1 mutation at no cost for 90 days from her test report date.    SUPPORT AND RESOURCES: If Ms. Simao is interested in BRCA-specific information and support, there are two groups, Facing Our  Risk (www.facingourrisk.com) and Bright Pink (www.brightpink.org) which some people have found useful. They provide opportunities to speak with other individuals from high-risk families. To locate genetic counselors in other cities, visit the website of the [employer] (ArtistMovie.se) and Secretary/administrator for a Social worker by zip code.  We encouraged Ms. Oettinger to remain in contact with [place] on an annual basis so we can update her personal and family histories, and let her know of advances in cancer genetics that may benefit the family. Our contact number was provided. Ms. Slagel questions were answered to her satisfaction today, and she knows she is welcome to call anytime with additional questions.   Tana Felts, MS Genetic Counselor [email or web address] phone: [phone]

## 2017-08-21 ENCOUNTER — Telehealth: Payer: Self-pay | Admitting: Genetics

## 2017-08-21 ENCOUNTER — Encounter: Payer: Self-pay | Admitting: Oncology

## 2017-08-21 ENCOUNTER — Encounter: Payer: Self-pay | Admitting: Genetics

## 2017-08-21 NOTE — Telephone Encounter (Signed)
Called to inform Sheila Fuller that the results of her genetic testing of the BAP1 gene ([facility]) that we added on was negative/normal.  The final results after analyzing 49 genes are a BRCA1 pathogenic mutation and a TSC2 VUS.

## 2017-09-07 ENCOUNTER — Ambulatory Visit (HOSPITAL_BASED_OUTPATIENT_CLINIC_OR_DEPARTMENT_OTHER): Payer: BLUE CROSS/BLUE SHIELD | Admitting: Oncology

## 2017-09-07 VITALS — BP 132/83 | HR 82 | Temp 98.5°F | Resp 20 | Ht 68.0 in | Wt 187.8 lb

## 2017-09-07 DIAGNOSIS — Z85048 Personal history of other malignant neoplasm of rectum, rectosigmoid junction, and anus: Secondary | ICD-10-CM

## 2017-09-07 DIAGNOSIS — Z803 Family history of malignant neoplasm of breast: Secondary | ICD-10-CM

## 2017-09-07 DIAGNOSIS — Z8049 Family history of malignant neoplasm of other genital organs: Secondary | ICD-10-CM | POA: Diagnosis not present

## 2017-09-07 DIAGNOSIS — Z801 Family history of malignant neoplasm of trachea, bronchus and lung: Secondary | ICD-10-CM

## 2017-09-07 DIAGNOSIS — Z1509 Genetic susceptibility to other malignant neoplasm: Principal | ICD-10-CM

## 2017-09-07 DIAGNOSIS — Z9189 Other specified personal risk factors, not elsewhere classified: Secondary | ICD-10-CM

## 2017-09-07 DIAGNOSIS — Z800 Family history of malignant neoplasm of digestive organs: Secondary | ICD-10-CM | POA: Diagnosis not present

## 2017-09-07 DIAGNOSIS — Z1501 Genetic susceptibility to malignant neoplasm of breast: Secondary | ICD-10-CM

## 2017-09-07 MED ORDER — PROCHLORPERAZINE MALEATE 10 MG PO TABS: 10.0000 mg | ORAL_TABLET | Freq: Three times a day (TID) | ORAL | 0 refills | Status: DC | PRN

## 2017-09-07 MED ORDER — CYCLOSPORINE 0.05 % OP EMUL: 1.0000 [drp] | Freq: Two times a day (BID) | OPHTHALMIC | 4 refills | Status: DC

## 2017-09-07 NOTE — Progress Notes (Signed)
[place]  [phone] [phone]     ID: Sheila Fuller DOB: [birth year]  MR#: [id number]  LKG#:[id number]  Patient Care Team: Colon Branch, MD as PCP - General Rigoberto Noel, MD as Consulting Physician (Pulmonary Disease) Camelia Phenes, DPM as Consulting Physician (Podiatry) Kellis Mcadam, Virgie Dad, MD as Consulting Physician (Oncology) Milus Banister, MD as Attending Physician (Gastroenterology) Marybelle Killings, MD as Consulting Physician (Orthopedic Surgery) Osborne Oman, MD as Attending Physician (Obstetrics and Gynecology) Everitt Amber, MD as Consulting Physician (Obstetrics and Gynecology) Chauncey Cruel, MD OTHER MD:  CHIEF COMPLAINT: BRCA1 positivity  CURRENT TREATMENT: intensified screening; awaiting definitive surgery   HISTORY OF CURRENT ILLNESS: Sheila Fuller's daughter was discussing the family history of cancer with her gynecologist and mentioned that there was a history of ovarian cancer.  This led to Sheila Fuller's daughter being tested and being found to be BRCA1 positive.  Actually there is no history of ovarian cancer in the family.  However given the overall cancer history genetics testing probably was warranted and certainly it was a lucky break for them that testing was initiated.  Sheila Fuller then met with our genetics counselors, was herself tested and found to be BRCA1 positive, and is here today in to discuss how to manage this.  The patient's subsequent history is as detailed below.  INTERVAL HISTORY: Sheila Fuller was evaluated in the high-risk clinic September 07, 2017  REVIEW OF SYSTEMS: Sheila Fuller is very busy and is not exercising regularly, but does enjoy walking.  She denies unusual headaches, visual changes, nausea, vomiting, stiff neck, dizziness, or gait imbalance. There has been no cough, phlegm production, or pleurisy, no chest pain or pressure, and no change in bowel or bladder habits. The patient denies fever, rash, bleeding,  unexplained fatigue or unexplained weight loss. A detailed review of systems was otherwise entirely negative.   PAST MEDICAL HISTORY: Past Medical History:  Diagnosis Date  . Allergic rhinitis   . Allergy   . Anxiety   . Arthritis   . Asthma    seasonally  . BRCA1 positive   . Cancer ([place])    skin  . 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   . Cataract    will remove left end of 10-2016  . Depression   . Family history of BRCA gene mutation    daughter BRCA1 +  . Family history of breast cancer   . Family history of pancreatic cancer   . GERD (gastroesophageal reflux disease)    takes zantac maybe 2/week  . Hyperlipidemia   . Lactose intolerance 10/04/2013  . Microhematuria 01/2006   saw Dr.Peterson, renal u/s: r pyelocaliectasis but CT was neg, cysto neg  . Shortness of breath    only with asthma flare up  . Sleep apnea    rarely uses cpap    PAST SURGICAL HISTORY: Past Surgical History:  Procedure Laterality Date  . cervical cauterization    . CESAREAN SECTION    . COLONOSCOPY    . GANGLION CYST EXCISION    . NASAL SEPTUM SURGERY    . POLYPECTOMY      FAMILY HISTORY Family History  Problem Relation Age of Onset  . Lung cancer Father 73       hx smoking  . Diabetes Mother        late onset  . Hyperlipidemia Mother   . Cancer Mother 42  metholosmia  . Cervical cancer Sister 54  . Cancer Maternal Aunt 60       colon/rectal  . Cancer Paternal Uncle        lung- hx smoking , died 43's  . BRCA 1/2 Daughter        had prophlactic bilat mastectomy  . Esophageal cancer Brother 73  . Breast cancer Paternal Aunt 73  . Breast cancer Paternal Aunt 15  . Lung cancer Paternal Uncle   . Pancreatic cancer Paternal Uncle 31  . Cancer Cousin        type unk, dx <50  . Coronary artery disease Neg Hx   . Hypertension Neg Hx   . Stroke Neg Hx   . Sudden death Neg Hx   . Heart attack Neg Hx   . Colon polyps Neg Hx    . Rectal cancer Neg Hx   . Stomach cancer Neg Hx    For a formal pedigree please consult our genetic counselor's notes dated 08/04/2017 in [place]:  No LMP recorded. Patient is postmenopausal. Menarche age 25, first live birth age 56, the patient isGX P1.  She underwent menopause at age 52, after chemotherapy and radiation for anal cancer.  She took hormone replacement approximately 4 years  SOCIAL HISTORY:  Sheila Fuller works for [employer] as a Engineer, structural.  Her husband [name] (with the patient) owns a franchise called "[employer]", which is a business that [employer] in [place] for example.  They are active in [place].  [name] has children from a prior marriage.  Their daughter lives in [place] and is a PhD in Nature conservation officer.  She works for [employer].  She has undergone bilateral salpingo-oophorectomy and bilateral mastectomies with DIEP flap reconstruction.  The patient is not a church is tender    ADVANCED DIRECTIVES:    HEALTH MAINTENANCE: Social History   Tobacco Use  . Smoking status: Former Smoker    Packs/day: 0.30    Years: 3.00    Pack years: 0.90    Types: Cigarettes  . Smokeless tobacco: Never Used  . Tobacco comment: quit 04-2013 ([facility])  Substance Use Topics  . Alcohol use: Yes    Alcohol/week: 1.8 oz    Types: 3 Standard drinks or equivalent per week    Comment: a week   . Drug use: No     Colonoscopy: December 2017/[facility]  PAP:  Bone density: T score -0.2 September 05, 2009   Allergies  Allergen Reactions  . Statins     REACTION: myalgia  . Sulfonamide Derivatives Rash    Current Outpatient Medications  Medication Sig Dispense Refill  . albuterol (PROVENTIL HFA;VENTOLIN HFA) 108 (90 Base) MCG/ACT inhaler Inhale 2 puffs into the lungs every 4 (four) hours as needed for wheezing or shortness of breath. 54 g 1  . amLODipine (NORVASC) 10 MG tablet Take 1 tablet (10 mg total) by mouth daily. 30 tablet 6  .  azelastine (ASTELIN) 0.1 % nasal spray Place 2 sprays into both nostrils at bedtime. Use in each nostril as directed 30 mL 6  . cyclobenzaprine (FLEXERIL) 5 MG tablet Take 1 tablet (5 mg total) by mouth 2 (two) times daily as needed for muscle spasms. 60 tablet 2  . desloratadine (CLARINEX) 5 MG tablet     . FLUoxetine (PROZAC) 20 MG capsule Take 1 capsule (20 mg total) by mouth daily. 90 capsule 1  . fluticasone (FLONASE) 50 MCG/ACT nasal spray Place 2 sprays into  both nostrils daily. 16 g 5  . losartan (COZAAR) 100 MG tablet Take 1 tablet (100 mg total) by mouth daily. 90 tablet 1  . phenylephrine (SUDAFED PE) 10 MG TABS tablet Take 10 mg by mouth every 4 (four) hours as needed.    Marland Kitchen Phenylephrine-APAP-Guaifenesin (MUCINEX SINUS-MAX PO) Take by mouth 4 (four) times daily as needed.    . ranitidine (ZANTAC) 75 MG tablet Take 75 mg by mouth at bedtime.     Current Facility-Administered Medications  Medication Dose Route Frequency Provider Last Rate Last Dose  . 0.9 %  sodium chloride infusion  500 mL Intravenous Continuous Milus Banister, MD        OBJECTIVE: Middle-aged white woman who appears well  Vitals:   09/07/17 1520  BP: 132/83  Pulse: 82  Resp: 20  Temp: 98.5 F (36.9 C)  SpO2: 98%     Body mass index is 28.55 kg/m.   Wt Readings from Last 3 Encounters:  09/07/17 187 lb 12.8 oz (85.2 kg)  07/01/17 183 lb (83 kg)  12/23/16 171 lb 8 oz (77.8 kg)      ECOG FS:0 - Asymptomatic  Ocular: Sclerae unicteric, pupils round and equal Ear-nose-throat: Oropharynx clear and moist Lymphatic: No cervical or supraclavicular adenopathy Lungs no rales or rhonchi Heart regular rate and rhythm Abd soft, nontender, positive bowel sounds MSK no focal spinal tenderness, no joint edema Neuro: non-focal, well-oriented, appropriate affect Breasts: I do not palpate a mass in either breast.  There are no skin or nipple changes of concern.  Both axillae are benign.   LAB RESULTS:  CMP      Component Value Date/Time   NA 140 07/01/2017 1104   K 4.1 07/01/2017 1104   CL 103 07/01/2017 1104   CO2 28 07/01/2017 1104   GLUCOSE 102 (H) 07/01/2017 1104   BUN 13 07/01/2017 1104   CREATININE 0.70 07/01/2017 1104   CALCIUM 9.2 07/01/2017 1104   PROT 7.6 08/15/2016 0942   ALBUMIN 4.5 08/15/2016 0942   AST 16 08/15/2016 0942   ALT 14 08/15/2016 0942   ALKPHOS 69 08/15/2016 0942   BILITOT 0.6 08/15/2016 0942   GFRNONAA >90 08/18/2014 1026   GFRAA >90 08/18/2014 1026    No results found for: TOTALPROTELP, ALBUMINELP, A1GS, A2GS, BETS, BETA2SER, GAMS, MSPIKE, SPEI  No results found for: Nils Pyle, [facility]  Lab Results  Component Value Date   WBC 6.7 08/15/2016   NEUTROABS 4.5 08/15/2016   HGB 14.7 08/15/2016   HCT 42.2 08/15/2016   MCV 86.4 08/15/2016   PLT 271.0 08/15/2016      Chemistry      Component Value Date/Time   NA 140 07/01/2017 1104   K 4.1 07/01/2017 1104   CL 103 07/01/2017 1104   CO2 28 07/01/2017 1104   BUN 13 07/01/2017 1104   CREATININE 0.70 07/01/2017 1104      Component Value Date/Time   CALCIUM 9.2 07/01/2017 1104   ALKPHOS 69 08/15/2016 0942   AST 16 08/15/2016 0942   ALT 14 08/15/2016 0942   BILITOT 0.6 08/15/2016 0942       No results found for: LABCA2  No components found for: OITGPQ982  No results for input(s): INR in the last 168 hours.  No results found for: LABCA2  No results found for: MEB583  No results found for: ENM076  No results found for: KGS811  No results found for: CA2729  No components found for: HGQUANT  No results found  for: CEA1 / No results found for: CEA1   No results found for: AFPTUMOR  No results found for: CHROMOGRNA  No results found for: PSA1  No visits with results within 3 Day(s) from this visit.  Latest known visit with results is:  Office Visit on 07/01/2017  Component Date Value Ref Range Status  . Sodium 07/01/2017 140  135 - 145 mEq/L Final  . Potassium  07/01/2017 4.1  3.5 - 5.1 mEq/L Final  . Chloride 07/01/2017 103  96 - 112 mEq/L Final  . CO2 07/01/2017 28  19 - 32 mEq/L Final  . Glucose, Bld 07/01/2017 102* 70 - 99 mg/dL Final  . BUN 07/01/2017 13  6 - 23 mg/dL Final  . Creatinine, Ser 07/01/2017 0.70  0.40 - 1.20 mg/dL Final  . Calcium 07/01/2017 9.2  8.4 - 10.5 mg/dL Final  . GFR 07/01/2017 90.57  >60.00 mL/min Final    (this displays the last labs from the last 3 days)  No results found for: TOTALPROTELP, ALBUMINELP, A1GS, A2GS, BETS, BETA2SER, GAMS, MSPIKE, SPEI (this displays SPEP labs)  No results found for: KPAFRELGTCHN, LAMBDASER, KAPLAMBRATIO (kappa/lambda light chains)  No results found for: HGBA, HGBA2QUANT, HGBFQUANT, HGBSQUAN (Hemoglobinopathy evaluation)   No results found for: LDH  No results found for: IRON, TIBC, IRONPCTSAT (Iron and TIBC)  No results found for: FERRITIN  Urinalysis    Component Value Date/Time   COLORURINE YELLOW 08/15/2016 [place] 08/15/2016 0942   LABSPEC <=1.005 (A) 08/15/2016 0942   PHURINE 7.0 08/15/2016 0942   GLUCOSEU NEGATIVE 08/15/2016 0942   HGBUR SMALL (A) 08/15/2016 0942   HGBUR large 10/15/2009 [place] 08/15/2016 0942   BILIRUBINUR Negative 09/14/2014 0840   KETONESUR NEGATIVE 08/15/2016 0942   PROTEINUR Negative 09/14/2014 0840   PROTEINUR NEGATIVE 08/18/2014 1026   UROBILINOGEN 0.2 08/15/2016 0942   NITRITE NEGATIVE 08/15/2016 0942   LEUKOCYTESUR NEGATIVE 08/15/2016 0942     STUDIES: Bilateral screening mammography at the breast center Mar 04, 2017 found the breast density to be category B.  There were no suspicious findings.  ELIGIBLE FOR AVAILABLE RESEARCH PROTOCOL: no  ASSESSMENT: 61 y.o. 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 c.5369T>G (p.Val1790Gly)  (c) there were no deleterious mutations in APC, ATM, AXIN2, BAP1, BARD1, BMPR1A, BRCA1, BRCA2, BRIP1, CDH1, CDK4, CDKN2A (p14ARF), CDKN2A (p16INK4a), CHEK2,CTNNA1, DICER1, EPCAM*, GREM1*, KIT, MEN1, MLH1, MSH2, MSH3, MSH6, MUTYH, NBN, NF1, PALB2, PDGFRA, PMS2, POLD1,POLE, PTEN, RAD50, RAD51C, RAD51D, SDHB, SDHC, SDHD, SMAD4, SMARCA4, STK11, TP53, TSC1, TSC2, VHL.  The following genes were evaluated for sequence changes only: EGFR*, HOXB13*, NTHL1*, SDHA  (1) history of anal carcinoma, s/p chemotherapy and radiation, 2010 [facility])  (2) breast cancer risk reduction:   (a) intensified screening    (i) yearly mammography and yearly breast MRI alternating Q6 months   (ii) biannual breast exams  (b) considering anti-estrogens  (3) ovarian cancer risk reduction:  (a) BSO pending  (4) pancreatic cancer screening:  (a) yearly MRCP PLAN: We spent the better part of today's hour-long appointment discussing the biology of her genetics diagnosis and the specifics of her situation.  She has good documentation from our genetics counselor regarding her relative risks of  breast, ovarian, and pancreatic cancers.  We discussed these in turn.  As far as breast cancer is concerned she understands there is no survival difference between intensified screening and bilateral mastectomies.  At this point she prefers intensified screening which means she will have an MRI of the breast this month then mammography next May, then repeat breast MRI next November, and so on indefinitely.  If this becomes weary some or too expensive she always has the option of mastectomies and in that case she tells me she would want implant reconstruction, no flaps  We also discussed risk reduction by taking antiestrogens.  We discussed the difference between tamoxifen and anastrozole  in detail and all that information was given to her in writing.  She understands either 1 of these inexpensive drugs will reduce her risk of developing breast cancer by about 50%.  At this point however she was not clear she wanted to proceed with that option.  As far as the ovarian cancer is concerned she understands we have no proven method for screening.  Accordingly she will meet with our gynecologic oncologist, Dr. Denman George, to schedule BSO.  If the patient is concerned because she had cesarean section and of course radiation for anal cancer remotely.  Hopefully they will be able to perform the BSO successfully by laparoscopy.  Sheila Fuller wanted to know if there was any indication for hysterectomy.  I am not aware of any and I am comfortable with her keeping her uterus.  Finally we discussed the risk of pancreatic cancer, which is slightly increased in patients with this mutation.  There is a history of pancreatic cancer in a paternal uncle, that being the side presumably where the BRCA mutation comes from.  Sheila Fuller understands we do not have any proven algorithm for pancreatic cancer screening and that no screening is a reasonable option.  If she wishes for screening options include MRCP, endoscopic ultrasonography, ERCP, ultrasonography, and of course CT scans.  Among these options I think MRCP is the most reasonable.  It involves no radiation, it can produce fine images of the pancreas and if there is an abnormality then we can proceed to more invasive procedures.  She is agreeable to this, and full understanding of the limitations involved.  To summarize then, she is going to be set up for MRI of the breast and MRCP of the pancreas, as well as for bilateral salpingo-oophorectomy.  She has information regarding antiestrogens and she will let me know if she did put a prescription for 1 of those medications.  If she chooses and antiestrogen I would like to see her about 3 months after she starts to make sure  she is tolerating it well.  Otherwise I will see her again in 1 year.  Sheila Fuller has a good understanding of the overall plan. She agrees with it. She knows the goal of treatment in her case is cure. She will call with any problems that may develop before her next visit here.  Chauncey Cruel, MD   09/07/2017 5:25 PM Medical Oncology and Hematology [facility] [address] [place], [place] [address] Tel. [phone]    Fax. [phone]

## 2017-09-10 ENCOUNTER — Other Ambulatory Visit: Payer: Self-pay | Admitting: *Deleted

## 2017-09-11 ENCOUNTER — Other Ambulatory Visit: Payer: Self-pay

## 2017-09-11 ENCOUNTER — Telehealth: Payer: Self-pay | Admitting: Oncology

## 2017-09-11 NOTE — Telephone Encounter (Signed)
Scheduled appt per 11/12 sch msg. Left voicemail for patient regarding added appointment.

## 2017-09-14 ENCOUNTER — Encounter: Payer: Self-pay | Admitting: Internal Medicine

## 2017-09-14 ENCOUNTER — Telehealth: Payer: Self-pay | Admitting: Genetics

## 2017-09-14 MED ORDER — CYCLOBENZAPRINE HCL 5 MG PO TABS: 5.0000 mg | ORAL_TABLET | Freq: Every evening | ORAL | 0 refills | Status: DC | PRN

## 2017-09-14 MED ORDER — NAPROXEN 500 MG PO TABS: 500.0000 mg | ORAL_TABLET | Freq: Every day | ORAL | 0 refills | Status: DC | PRN

## 2017-09-14 NOTE — Telephone Encounter (Signed)
ok 90 of each, see message  Received: Today  Message Contents  Colon Branch, MD  Damita Dunnings, [place]

## 2017-09-14 NOTE — Telephone Encounter (Signed)
Called patient to check in after high risk BRCA1 appointment with Dr. Jana Hakim.  I asked if she had any further questions and to check in that everything was being taken care of for her.  She said that all her questions were answered and the has an appointment with Dr. Denman George to discuss her ovarian surgery.  She asked for an email to send her daughter's test report to and I provided her with this information.  I will save it on our genetics team server and inform Dr. Jana Hakim and Dr. Denman George that we have it available if they need this document.  I can confirm that the mutation identified in her daughter is the same as the one identified in her.  She reports that her sister has had testing and that her brother will be going in for testing soon.

## 2017-09-25 ENCOUNTER — Ambulatory Visit
Admission: RE | Admit: 2017-09-25 | Discharge: 2017-09-25 | Disposition: A | Discharge status: Home / Self Care | Payer: BLUE CROSS/BLUE SHIELD | Source: Ambulatory Visit | Attending: Oncology | Admitting: Oncology

## 2017-09-25 DIAGNOSIS — Z1501 Genetic susceptibility to malignant neoplasm of breast: Secondary | ICD-10-CM

## 2017-09-25 DIAGNOSIS — Z1589 Genetic susceptibility to other disease: Secondary | ICD-10-CM

## 2017-09-25 DIAGNOSIS — Z1509 Genetic susceptibility to other malignant neoplasm: Principal | ICD-10-CM

## 2017-09-25 DIAGNOSIS — Z9189 Other specified personal risk factors, not elsewhere classified: Secondary | ICD-10-CM

## 2017-09-25 DIAGNOSIS — N6489 Other specified disorders of breast: Secondary | ICD-10-CM | POA: Diagnosis not present

## 2017-09-25 MED ORDER — GADOBENATE DIMEGLUMINE 529 MG/ML IV SOLN
18.0000 mL | Freq: Once | INTRAVENOUS | Status: AC | PRN
  Administered 2017-09-25: 18 mL via INTRAVENOUS

## 2017-10-05 ENCOUNTER — Inpatient Hospital Stay: Admission: RE | Admit: 2017-10-05 | Payer: BLUE CROSS/BLUE SHIELD | Source: Ambulatory Visit

## 2017-10-12 ENCOUNTER — Encounter: Payer: Self-pay | Admitting: Gynecologic Oncology

## 2017-10-12 ENCOUNTER — Ambulatory Visit: Payer: BLUE CROSS/BLUE SHIELD | Attending: Gynecologic Oncology | Admitting: Gynecologic Oncology

## 2017-10-12 VITALS — BP 141/81 | HR 87 | Temp 98.3°F | Resp 18 | Ht 68.0 in | Wt 187.9 lb

## 2017-10-12 DIAGNOSIS — Z923 Personal history of irradiation: Secondary | ICD-10-CM | POA: Diagnosis not present

## 2017-10-12 DIAGNOSIS — Z833 Family history of diabetes mellitus: Secondary | ICD-10-CM | POA: Diagnosis not present

## 2017-10-12 DIAGNOSIS — Z1502 Genetic susceptibility to malignant neoplasm of ovary: Secondary | ICD-10-CM

## 2017-10-12 DIAGNOSIS — F329 Major depressive disorder, single episode, unspecified: Secondary | ICD-10-CM | POA: Insufficient documentation

## 2017-10-12 DIAGNOSIS — Z882 Allergy status to sulfonamides status: Secondary | ICD-10-CM | POA: Diagnosis not present

## 2017-10-12 DIAGNOSIS — Z803 Family history of malignant neoplasm of breast: Secondary | ICD-10-CM | POA: Diagnosis not present

## 2017-10-12 DIAGNOSIS — Z1501 Genetic susceptibility to malignant neoplasm of breast: Secondary | ICD-10-CM | POA: Diagnosis not present

## 2017-10-12 DIAGNOSIS — Z87891 Personal history of nicotine dependence: Secondary | ICD-10-CM | POA: Insufficient documentation

## 2017-10-12 DIAGNOSIS — J45909 Unspecified asthma, uncomplicated: Secondary | ICD-10-CM | POA: Insufficient documentation

## 2017-10-12 DIAGNOSIS — F419 Anxiety disorder, unspecified: Secondary | ICD-10-CM | POA: Insufficient documentation

## 2017-10-12 DIAGNOSIS — Z888 Allergy status to other drugs, medicaments and biological substances status: Secondary | ICD-10-CM | POA: Diagnosis not present

## 2017-10-12 DIAGNOSIS — K219 Gastro-esophageal reflux disease without esophagitis: Secondary | ICD-10-CM | POA: Diagnosis not present

## 2017-10-12 DIAGNOSIS — Z801 Family history of malignant neoplasm of trachea, bronchus and lung: Secondary | ICD-10-CM | POA: Diagnosis not present

## 2017-10-12 DIAGNOSIS — Z85048 Personal history of other malignant neoplasm of rectum, rectosigmoid junction, and anus: Secondary | ICD-10-CM | POA: Insufficient documentation

## 2017-10-12 DIAGNOSIS — E785 Hyperlipidemia, unspecified: Secondary | ICD-10-CM | POA: Diagnosis not present

## 2017-10-12 DIAGNOSIS — G4730 Sleep apnea, unspecified: Secondary | ICD-10-CM | POA: Insufficient documentation

## 2017-10-12 DIAGNOSIS — Z9221 Personal history of antineoplastic chemotherapy: Secondary | ICD-10-CM | POA: Insufficient documentation

## 2017-10-12 DIAGNOSIS — M1990 Unspecified osteoarthritis, unspecified site: Secondary | ICD-10-CM | POA: Diagnosis not present

## 2017-10-12 DIAGNOSIS — Z79899 Other long term (current) drug therapy: Secondary | ICD-10-CM | POA: Diagnosis not present

## 2017-10-12 DIAGNOSIS — Z800 Family history of malignant neoplasm of digestive organs: Secondary | ICD-10-CM | POA: Insufficient documentation

## 2017-10-12 DIAGNOSIS — Z1509 Genetic susceptibility to other malignant neoplasm: Secondary | ICD-10-CM

## 2017-10-12 DIAGNOSIS — Z981 Arthrodesis status: Secondary | ICD-10-CM | POA: Insufficient documentation

## 2017-10-12 NOTE — Patient Instructions (Signed)
Preparing for your Surgery  Plan for surgery on November 24, 2017 with Dr. Everitt Amber at [facility] will be scheduled for a robotic assisted bilateral salpingo-oophorectomy.  Pre-operative Testing -You will receive a phone call from presurgical testing at [facility] to arrange for a pre-operative testing appointment before your surgery.  This appointment normally occurs one to two weeks before your scheduled surgery.   -Bring your insurance card, copy of an advanced directive if applicable, medication list  -At that visit, you will be asked to sign a consent for a possible blood transfusion in case a transfusion becomes necessary during surgery.  The need for a blood transfusion is rare but having consent is a necessary part of your care.    -You should not be taking blood thinners or aspirin at least ten days prior to surgery unless instructed by your surgeon.  Day Before Surgery at [facility] will be asked to take in a light diet the day before surgery.  Avoid carbonated beverages.  You will be advised to have nothing to eat or drink after midnight the evening before.    Eat a light diet the day before surgery.  Examples including soups, broths, toast, yogurt, mashed potatoes.  Things to avoid include carbonated beverages (fizzy beverages), raw fruits and raw vegetables, or beans.   If your bowels are filled with gas, your surgeon will have difficulty visualizing your pelvic organs which increases your surgical risks.  Your role in recovery Your role is to become active as soon as directed by your doctor, while still giving yourself time to heal.  Rest when you feel tired. You will be asked to do the following in order to speed your recovery:  - Cough and breathe deeply. This helps toclear and expand your lungs and can prevent pneumonia. You may be given a spirometer to practice deep breathing. A staff member will show you how to use the spirometer. - Do  mild physical activity. Walking or moving your legs help your circulation and body functions return to normal. A staff member will help you when you try to walk and will provide you with simple exercises. Do not try to get up or walk alone the first time. - Actively manage your pain. Managing your pain lets you move in comfort. We will ask you to rate your pain on a scale of zero to 10. It is your responsibility to tell your doctor or nurse where and how much you hurt so your pain can be treated.  Special Considerations -If you are diabetic, you may be placed on insulin after surgery to have closer control over your blood sugars to promote healing and recovery.  This does not mean that you will be discharged on insulin.  If applicable, your oral antidiabetics will be resumed when you are tolerating a solid diet.  -Your final pathology results from surgery should be available by the Friday after surgery and the results will be relayed to you when available.  -Dr. Lahoma Crocker is the Surgeon that assists your GYN Oncologist with surgery.  The next day after your surgery you will either see your GYN Oncologist or Dr. Lahoma Crocker.   Blood Transfusion Information WHAT IS A BLOOD TRANSFUSION? A transfusion is the replacement of blood or some of its parts. Blood is made up of multiple cells which provide different functions.  Red blood cells carry oxygen and are used for blood loss replacement.  White blood cells fight against  infection.  Platelets control bleeding.  Plasma helps clot blood.  Other blood products are available for specialized needs, such as hemophilia or other clotting disorders. BEFORE THE TRANSFUSION  Who gives blood for transfusions?   You may be able to donate blood to be used at a later date on yourself (autologous donation).  Relatives can be asked to donate blood. This is generally not any safer than if you have received blood from a stranger. The same  precautions are taken to ensure safety when a relative's blood is donated.  Healthy volunteers who are fully evaluated to make sure their blood is safe. This is blood bank blood. Transfusion therapy is the safest it has ever been in the practice of medicine. Before blood is taken from a donor, a complete history is taken to make sure that person has no history of diseases nor engages in risky social behavior (examples are intravenous drug use or sexual activity with multiple partners). The donor's travel history is screened to minimize risk of transmitting infections, such as malaria. The donated blood is tested for signs of infectious diseases, such as HIV and hepatitis. The blood is then tested to be sure it is compatible with you in order to minimize the chance of a transfusion reaction. If you or a relative donates blood, this is often done in anticipation of surgery and is not appropriate for emergency situations. It takes many days to process the donated blood. RISKS AND COMPLICATIONS Although transfusion therapy is very safe and saves many lives, the main dangers of transfusion include:   Getting an infectious disease.  Developing a transfusion reaction. This is an allergic reaction to something in the blood you were given. Every precaution is taken to prevent this. The decision to have a blood transfusion has been considered carefully by your caregiver before blood is given. Blood is not given unless the benefits outweigh the risks.

## 2017-10-12 NOTE — Progress Notes (Signed)
Consult Note: Gyn-Onc  Consult was requested by Dr. Jana Hakim for the evaluation of Sheila Fuller 61 y.o. female  CC:  Chief Complaint  Patient presents with  . BRCA1 positive    Assessment/Plan:  Sheila Fuller  is a 61 y.o.  year old with BRCA 1 deleterious mutation.  We discussed the options for intervention with risk reducing surgery with BSO. I discussed that hysterectomy is an option because there is some increased risk for serous endometrial cancer reported among BRCA 1 mutation carriers. However, the patient is very concerned about increased operative risks given her history of whole pelvic RT in 2001 for anal cancer.  I think it is reasonable to restrict her surgery to robotic assisted BSO.  I discussed operative risks including  bleeding, infection, damage to internal organs (such as bladder,ureters, bowels), blood clot, reoperation and rehospitalization. I discussed anticipated recovery time.  HPI: Sheila Fuller is a 61 year old P1 who is seen in consultation at the request of Dr Jana Hakim for a deleterious mutation in BRCA 1.  The patient's daughter received genetic testing in her OBGYN's office and was incidentally found to have a mutation in BRCA 1. The patient then underwent subsequent testing which confirmed the same mutation. She has no personal history of breast or ovarian cancer, though she does have a history of anal cancer in 2001 treated with chemoradiation (NED).   She has had one prior cesarean section. She has been menopausal since radiation. No history of abnormal cesarean sections.   Current Meds:  Outpatient Encounter Medications as of 10/12/2017  Medication Sig  . albuterol (PROVENTIL HFA;VENTOLIN HFA) 108 (90 Base) MCG/ACT inhaler Inhale 2 puffs into the lungs every 4 (four) hours as needed for wheezing or shortness of breath.  Marland Kitchen amLODipine (NORVASC) 10 MG tablet Take 1 tablet (10 mg total) by mouth daily.  Marland Kitchen azelastine (ASTELIN) 0.1 % nasal spray  Place 2 sprays into both nostrils at bedtime. Use in each nostril as directed  . cyclobenzaprine (FLEXERIL) 5 MG tablet Take 1 tablet (5 mg total) at bedtime as needed by mouth for muscle spasms.  Marland Kitchen desloratadine (CLARINEX) 5 MG tablet   . FLUoxetine (PROZAC) 20 MG capsule Take 1 capsule (20 mg total) by mouth daily.  Marland Kitchen losartan (COZAAR) 100 MG tablet Take 1 tablet (100 mg total) by mouth daily.  . naproxen (NAPROSYN) 500 MG tablet Take 1 tablet (500 mg total) daily as needed by mouth for moderate pain.  Marland Kitchen Phenylephrine-APAP-Guaifenesin (MUCINEX SINUS-MAX PO) Take by mouth 4 (four) times daily as needed.  . ranitidine (ZANTAC) 75 MG tablet Take 75 mg by mouth at bedtime.  . phenylephrine (SUDAFED PE) 10 MG TABS tablet Take 10 mg by mouth every 4 (four) hours as needed.  . [DISCONTINUED] fluticasone (FLONASE) 50 MCG/ACT nasal spray Place 2 sprays into both nostrils daily.   Facility-Administered Encounter Medications as of 10/12/2017  Medication  . 0.9 %  sodium chloride infusion    Allergy:  Allergies  Allergen Reactions  . Statins     REACTION: myalgia  . Sulfonamide Derivatives Rash    Social Hx:   Social History   Socioeconomic History  . Marital status: Married    Spouse name: Not on file  . Number of children: 1  . Years of education: Not on file  . Highest education level: Not on file  Social Needs  . Financial resource strain: Not on file  . Food insecurity - worry: Not on file  .  Food insecurity - inability: Not on file  . Transportation needs - medical: Not on file  . Transportation needs - non-medical: Not on file  Occupational History  . 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 Smoking status: Former Smoker    Packs/day: 0.30    Years: 3.00    Pack years: 0.90    Types: Cigarettes  . Smokeless tobacco: Never Used  . Tobacco comment: quit 04-2013 (Chantix)  Substance and Sexual Activity  . Alcohol use: Yes     Alcohol/week: 1.8 oz    Types: 3 Standard drinks or equivalent per week    Comment: a week   . Drug use: No  . Sexual activity: Yes  Other Topics Concern  . Not on file  Social History Narrative   remarried 10-11, 1 child    Past Surgical Hx:  Past Surgical History:  Procedure Laterality Date  . ANTERIOR CERVICAL DECOMP/DISCECTOMY FUSION N/A 08/23/2014   Procedure: C5-6 Anterior Cervical Discectomy and Fusion, Allograft, Plate;  Surgeon: Marybelle Killings, MD;  Location: [facility];  Service: Orthopedics;  Laterality: N/A;  . cervical cauterization    . CESAREAN SECTION    . COLONOSCOPY    . GANGLION CYST EXCISION    . NASAL SEPTUM SURGERY    . POLYPECTOMY      Past Medical Hx:  Past Medical History:  Diagnosis Date  . Allergic rhinitis   . Allergy   . Anxiety   . Arthritis   . Asthma    seasonally  . BRCA1 positive   . Cancer ([place])    skin  . 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   . Cataract    will remove left end of 10-2016  . Depression   . Family history of BRCA gene mutation    daughter BRCA1 +  . Family history of breast cancer   . Family history of pancreatic cancer   . GERD (gastroesophageal reflux disease)    takes zantac maybe 2/week  . Hyperlipidemia   . Lactose intolerance 10/04/2013  . Microhematuria 01/2006   saw Dr.Peterson, renal u/s: r pyelocaliectasis but CT was neg, cysto neg  . Shortness of breath    only with asthma flare up  . Sleep apnea    rarely uses cpap    Past Gynecological History:  Cesarean section x 1 No LMP recorded. Patient is postmenopausal.  Family Hx:  Family History  Problem Relation Age of Onset  . Lung cancer Father 52       hx smoking  . Diabetes Mother        late onset  . Hyperlipidemia Mother   . Cancer Mother 30       metholosmia  . Cervical cancer Sister 58  . Cancer Maternal Aunt 60       colon/rectal  . Cancer Paternal Uncle        lung- hx smoking , died 8's   . BRCA 1/2 Daughter        had prophlactic bilat mastectomy  . Esophageal cancer Brother 80  . Breast cancer Paternal Aunt 79  . Breast cancer Paternal Aunt 43  . Lung cancer Paternal Uncle   . Pancreatic cancer Paternal Uncle 46  . Cancer Cousin        type unk, dx <50  . Coronary artery disease Neg Hx   . Hypertension  Neg Hx   . Stroke Neg Hx   . Sudden death Neg Hx   . Heart attack Neg Hx   . Colon polyps Neg Hx   . Rectal cancer Neg Hx   . Stomach cancer Neg Hx     Review of Systems:  Constitutional  Feels well,    ENT Normal appearing ears and nares bilaterally Skin/Breast  No rash, sores, jaundice, itching, dryness Cardiovascular  No chest pain, shortness of breath, or edema  Pulmonary  No cough or wheeze.  Gastro Intestinal  No nausea, vomitting, or diarrhoea. No bright red blood per rectum, no abdominal pain, change in bowel movement, or constipation.  Genito Urinary  No frequency, urgency, dysuria, no bleeding Musculo Skeletal  No myalgia, arthralgia, joint swelling or pain  Neurologic  No weakness, numbness, change in gait,  Psychology  No depression, anxiety, insomnia.   Vitals:  Blood pressure (!) 141/81, pulse 87, temperature 98.3 F (36.8 C), temperature source Oral, resp. rate 18, height '5\' 8"'  (1.727 m), weight 187 lb 14.4 oz (85.2 kg), SpO2 97 %.  Physical Exam: WD in NAD Neck  Supple NROM, without any enlargements.  Lymph Node Survey No cervical supraclavicular or inguinal adenopathy Cardiovascular  Pulse normal rate, regularity and rhythm. S1 and S2 normal.  Lungs  Clear to auscultation bilateraly, without wheezes/crackles/rhonchi. Good air movement.  Skin  No rash/lesions/breakdown  Psychiatry  Alert and oriented to person, place, and time  Abdomen  Normoactive bowel sounds, abdomen soft, non-tender and nonobese without evidence of hernia.  Back No CVA tenderness Genito Urinary  Vulva/vagina: Normal external female genitalia.   No  lesions. No discharge or bleeding.  Bladder/urethra:  No lesions or masses, well supported bladder  Vagina: normal, atrophic  Cervix: Normal appearing, no lesions. Atrophic.  Uterus:  Small, mobile, no parametrial involvement or nodularity.  Adnexa: no masses. Rectal  Good tone, no masses no cul de sac nodularity.  Extremities  No bilateral cyanosis, clubbing or edema.   Donaciano Eva, MD  10/12/2017, 10:06 AM

## 2017-10-14 ENCOUNTER — Encounter: Payer: Self-pay | Admitting: Internal Medicine

## 2017-10-14 ENCOUNTER — Ambulatory Visit (INDEPENDENT_AMBULATORY_CARE_PROVIDER_SITE_OTHER): Payer: BLUE CROSS/BLUE SHIELD | Admitting: Internal Medicine

## 2017-10-14 VITALS — BP 132/76 | HR 85 | Temp 97.9°F | Resp 14 | Ht 68.0 in | Wt 186.5 lb

## 2017-10-14 DIAGNOSIS — Z1159 Encounter for screening for other viral diseases: Secondary | ICD-10-CM | POA: Diagnosis not present

## 2017-10-14 DIAGNOSIS — Z0000 Encounter for general adult medical examination without abnormal findings: Secondary | ICD-10-CM

## 2017-10-14 NOTE — Progress Notes (Signed)
Pre visit review using our clinic review tool, if applicable. No additional management support is needed unless otherwise documented below in the visit note. 

## 2017-10-14 NOTE — Assessment & Plan Note (Addendum)
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

## 2017-10-14 NOTE — Progress Notes (Signed)
Subjective:    Patient ID: Sheila Fuller, female    DOB: [birth year], 61 y.o.   MRN: [id number]  DOS:  10/14/2017 Type of visit - description : cpx Interval history: Going well. Is having some stress related to being diagnosed + BRCA. Not doing too well with diet and exercise but plans to improve  Review of Systems  Other than above, a 14 point review of systems is negative     Past Medical History:  Diagnosis Date  . Allergic rhinitis   . Allergy   . Anxiety   . Arthritis   . Asthma    seasonally  . BRCA1 positive   . Cancer ([place])    skin  . 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   . Cataract    will remove left end of 10-2016  . Depression   . Family history of BRCA gene mutation    daughter BRCA1 +  . Family history of breast cancer   . Family history of pancreatic cancer   . GERD (gastroesophageal reflux disease)    takes zantac maybe 2/week  . Hyperlipidemia   . Lactose intolerance 10/04/2013  . Microhematuria 01/2006   saw Dr.Peterson, renal u/s: r pyelocaliectasis but CT was neg, cysto neg  . Shortness of breath    only with asthma flare up  . Sleep apnea    rarely uses cpap    Past Surgical History:  Procedure Laterality Date  . ANTERIOR CERVICAL DECOMP/DISCECTOMY FUSION N/A 08/23/2014   Procedure: C5-6 Anterior Cervical Discectomy and Fusion, Allograft, Plate;  Surgeon: Marybelle Killings, MD;  Location: [facility];  Service: Orthopedics;  Laterality: N/A;  . cervical cauterization    . CESAREAN SECTION    . COLONOSCOPY    . GANGLION CYST EXCISION    . NASAL SEPTUM SURGERY    . POLYPECTOMY      Social History   Socioeconomic History  . Marital status: Married    Spouse name: Not on file  . Number of children: 1  . Years of education: Not on file  . Highest education level: Not on file  Social Needs  . Financial resource strain: Not on file  . Food insecurity - worry: Not on file  . Food insecurity -  inability: Not on file  . Transportation needs - medical: Not on file  . Transportation needs - non-medical: Not on file  Occupational History  . 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 Smoking status: Former Smoker    Packs/day: 0.30    Years: 3.00    Pack years: 0.90    Types: Cigarettes  . Smokeless tobacco: Never Used  . Tobacco comment: quit 04-2013 (Chantix)  Substance and Sexual Activity  . Alcohol use: Yes    Alcohol/week: 1.8 oz    Types: 3 Standard drinks or equivalent per week    Comment: a week   . Drug use: No  . Sexual activity: Yes  Other Topics Concern  . Not on file  Social History Narrative   remarried 10-11, 1 child   Family History  Problem Relation Age of Onset  . Lung cancer Father 62       hx smoking  . Diabetes Mother        late onset  . Hyperlipidemia Mother   . Cancer Mother 41  metholosmia  . Cervical cancer Sister 88  . Cancer Maternal Aunt 60       colon/rectal  . Cancer Paternal Uncle        lung- hx smoking , died 60's  . BRCA 1/2 Daughter        had prophlactic bilat mastectomy  . Esophageal cancer Brother 27  . Breast cancer Paternal Aunt 43  . Breast cancer Paternal Aunt 80  . Lung cancer Paternal Uncle   . Pancreatic cancer Paternal Uncle 48  . Cancer Cousin        type unk, dx <50  . Coronary artery disease Neg Hx   . Hypertension Neg Hx   . Stroke Neg Hx   . Sudden death Neg Hx   . Heart attack Neg Hx   . Colon polyps Neg Hx   . Rectal cancer Neg Hx   . Stomach cancer Neg Hx      Allergies as of 10/14/2017      Reactions   Statins    REACTION: myalgia   Sulfonamide Derivatives Rash      Medication List        Accurate as of 10/14/17 11:59 PM. Always use your most recent med list.          albuterol 108 (90 Base) MCG/ACT inhaler Commonly known as:  PROVENTIL HFA;VENTOLIN HFA Inhale 2 puffs into the lungs every 4 (four) hours as needed for wheezing or shortness of  breath.   amLODipine 10 MG tablet Commonly known as:  NORVASC Take 1 tablet (10 mg total) by mouth daily.   azelastine 0.1 % nasal spray Commonly known as:  ASTELIN Place 2 sprays into both nostrils at bedtime. Use in each nostril as directed   cyclobenzaprine 5 MG tablet Commonly known as:  FLEXERIL Take 1 tablet (5 mg total) at bedtime as needed by mouth for muscle spasms.   desloratadine 5 MG tablet Commonly known as:  CLARINEX   FLUoxetine 20 MG capsule Commonly known as:  PROZAC Take 1 capsule (20 mg total) by mouth daily.   losartan 100 MG tablet Commonly known as:  COZAAR Take 1 tablet (100 mg total) by mouth daily.   naproxen 500 MG tablet Commonly known as:  NAPROSYN Take 1 tablet (500 mg total) daily as needed by mouth for moderate pain.   ranitidine 75 MG tablet Commonly known as:  ZANTAC Take 75 mg by mouth at bedtime.          Objective:   Physical Exam BP 132/76 (BP Location: Left Arm, Patient Position: Sitting, Cuff Size: Normal)   Pulse 85   Temp 97.9 F (36.6 C) (Oral)   Resp 14   Ht '5\' 8"'  (1.727 m)   Wt 186 lb 8 oz (84.6 kg)   SpO2 98%   BMI 28.36 kg/m  General:   Well developed, well nourished . NAD.  Neck: No  thyromegaly  HEENT:  Normocephalic . Face symmetric, atraumatic Lungs:  CTA B Normal respiratory effort, no intercostal retractions, no accessory muscle use. Heart: RRR,  no murmur.  No pretibial edema bilaterally  Abdomen:  Not distended, soft, non-tender. No rebound or rigidity.   Skin: Exposed areas without rash. Not pale. Not jaundice Neurologic:  alert & oriented X3.  Speech normal, gait appropriate for age and unassisted Strength symmetric and appropriate for age.  Psych: Cognition and judgment appear intact.  Cooperative with normal attention span and concentration.  Behavior appropriate. No anxious or depressed appearing.  Assessment & Plan:   Assessment HTN Hyperlipidemia Anxiety,  depression GERD Seasonal asthma Allergies Obesity : BMI~ 28 plus HTN, hyperlipidemia, OSA OSA: Uses CPAP irregularly MSK: DJD, occasionally uses pain medication H/o Microhematuria 2007, Dr. Terance Hart, renal US showed right pyelocaliectasis, CT was negative, cystoscopy negative + BRCA: personal h/o (dx 2018)  and FH BRCA (daughter, sister).  See office visit 10/14/2017 H/o Carcinoma in situ, perianal skin, XRT, chemotherapy 2000. Not further follow-ups with specialist  PLAN:  HTN: On losartan, amlodipine, controlled  hyperlipidemia: Diet controlled, checking labs Anxiety depression: On Prozac, doing okay. + BRCA: Tested positive, had a visit with a genetic counselor on Dr. Elease Hashimoto.  Increased risk for breast cancer, ovarian cancer and to some extent pancreatic cancer.  Excerpts from those visits: to reduce the risk for breast cancer, prophylactic bilateral mastectomy is the most effective option. However, for women who choose to keep their breasts, we recommend yearly mammograms, yearly breast MRI, twice-yearly clinical breast exams through a high-risk clinic, and monthly self-breast exams. === To reduce the risk for ovarian cancer, we recommend Ms. Eckels  have a prophylactic bilateral salpingo-oophorectomy. == Jeilani understands we do not have any proven algorithm for pancreatic cancer screening and that no screening is a reasonable option.  If she wishes for screening options include MRCP, endoscopic ultrasonography, ERCP, ultrasonography, and of course CT scans.  RTC 6 months

## 2017-10-14 NOTE — Patient Instructions (Signed)
  GO TO THE FRONT DESK  Schedule labs to be done, fasting, within few days Schedule your next appointment for a routine checkup in 6 months

## 2017-10-15 ENCOUNTER — Other Ambulatory Visit (INDEPENDENT_AMBULATORY_CARE_PROVIDER_SITE_OTHER): Payer: BLUE CROSS/BLUE SHIELD

## 2017-10-15 DIAGNOSIS — Z1159 Encounter for screening for other viral diseases: Secondary | ICD-10-CM

## 2017-10-15 LAB — CBC WITH DIFFERENTIAL/PLATELET
BASOS ABS: 0.1 10*3/uL (ref 0.0–0.1)
Basophils Relative: 2.2 % (ref 0.0–3.0)
EOS PCT: 6.1 % — AB (ref 0.0–5.0)
Eosinophils Absolute: 0.3 10*3/uL (ref 0.0–0.7)
HCT: 41.9 % (ref 36.0–46.0)
HEMOGLOBIN: 14 g/dL (ref 12.0–15.0)
Lymphocytes Relative: 25.7 % (ref 12.0–46.0)
Lymphs Abs: 1.3 10*3/uL (ref 0.7–4.0)
MCHC: 33.4 g/dL (ref 30.0–36.0)
MCV: 91.1 fl (ref 78.0–100.0)
MONO ABS: 0.4 10*3/uL (ref 0.1–1.0)
MONOS PCT: 6.9 % (ref 3.0–12.0)
NEUTROS PCT: 59.1 % (ref 43.0–77.0)
Neutro Abs: 3 10*3/uL (ref 1.4–7.7)
Platelets: 258 10*3/uL (ref 150.0–400.0)
RBC: 4.6 Mil/uL (ref 3.87–5.11)
RDW: 13.1 % (ref 11.5–15.5)
WBC: 5.1 10*3/uL (ref 4.0–10.5)

## 2017-10-15 LAB — AST: AST: 15 U/L (ref 0–37)

## 2017-10-15 LAB — LIPID PANEL
CHOL/HDL RATIO: 4
Cholesterol: 244 mg/dL — ABNORMAL HIGH (ref 0–200)
HDL: 65.2 mg/dL (ref >39.00)
LDL Cholesterol: 151 mg/dL — ABNORMAL HIGH (ref 0–99)
NONHDL: 179.13
Triglycerides: 140 mg/dL (ref 0.0–149.0)
VLDL: 28 mg/dL (ref 0.0–40.0)

## 2017-10-15 LAB — ALT: ALT: 14 U/L (ref 0–35)

## 2017-10-15 LAB — TSH: TSH: 3.04 u[IU]/mL (ref 0.35–4.50)

## 2017-10-15 NOTE — Assessment & Plan Note (Signed)
HTN: On losartan, amlodipine, controlled  hyperlipidemia: Diet controlled, checking labs Anxiety depression: On Prozac, doing okay. + BRCA: Tested positive, had a visit with a genetic counselor on Dr. Elease Hashimoto.  Increased risk for breast cancer, ovarian cancer and to some extent pancreatic cancer.  Excerpts from those visits: to reduce the risk for breast cancer, prophylactic bilateral mastectomy is the most effective option. However, for women who choose to keep their breasts, we recommend yearly mammograms, yearly breast MRI, twice-yearly clinical breast exams through a high-risk clinic, and monthly self-breast exams. === To reduce the risk for ovarian cancer, we recommend Ms. Peckman  have a prophylactic bilateral salpingo-oophorectomy. == Adonna understands we do not have any proven algorithm for pancreatic cancer screening and that no screening is a reasonable option.  If she wishes for screening options include MRCP, endoscopic ultrasonography, ERCP, ultrasonography, and of course CT scans.  RTC 6 months

## 2017-10-16 LAB — HEPATITIS C ANTIBODY
Hepatitis C Ab: NONREACTIVE
SIGNAL TO CUT-OFF: 0.01 (ref <1.00)

## 2017-10-29 ENCOUNTER — Encounter: Payer: Self-pay | Admitting: General Practice

## 2017-10-29 NOTE — Progress Notes (Signed)
[facility] CSW Progress Notes  Appt made w patient to complete Advance Directives - appt on 1/17 at 2 AM in [facility].  Patient mailed copy of blank form to review prior to appointment.  Edwyna Shell, LCSW Clinical Social Worker Phone:  [phone]

## 2017-11-04 ENCOUNTER — Encounter: Payer: Self-pay | Admitting: Internal Medicine

## 2017-11-04 MED ORDER — CARVEDILOL 6.25 MG PO TABS: 6.2500 mg | ORAL_TABLET | Freq: Two times a day (BID) | ORAL | 6 refills | Status: DC

## 2017-11-04 NOTE — Telephone Encounter (Signed)
See message  Received: Today  Message Contents  Colon Branch, MD  Damita Dunnings, [place] [facility] a prescription for carvedilol 6.25 mg 1 p.o. twice daily #60 and 6 refills

## 2017-11-04 NOTE — Telephone Encounter (Signed)
Amlodipine d/c and carvedilol 6.25mg  sent to [facility].

## 2017-11-12 ENCOUNTER — Encounter: Payer: Self-pay | Admitting: General Practice

## 2017-11-12 NOTE — Progress Notes (Signed)
[facility] Social Work  Clinical Social Work was referred by patient  to review and complete healthcare advance directives.  Clinical Social Worker met with patient in [facility] office.  The patient designated Charliee Krenz as their primary healthcare agent and Lewie Chamber as their secondary agent.  Patient also completed healthcare living will.    Clinical Social Worker notarized documents and made copies for patient/family. Clinical Social Worker will send documents to medical records to be scanned into patient's chart. Clinical Social Worker encouraged patient/family to contact with any additional questions or concerns.  Edwyna Shell, LCSW Clinical Social Worker Phone:  [phone]

## 2017-11-16 ENCOUNTER — Encounter (HOSPITAL_COMMUNITY): Payer: Self-pay

## 2017-11-16 NOTE — Pre-Procedure Instructions (Signed)
The following are in epic: Last office visit Dr. Denman George 10/12/17 Last office visit Dr. Larose Kells 10/15/17

## 2017-11-16 NOTE — Patient Instructions (Addendum)
Your procedure is scheduled on: Tuesday, Jan. 29, 2019   Surgery Time:  11:45AM-1:15PM   Report to [facility] Main  Entrance    Report to admitting at 9:15 AM   Call this number if you have problems the morning of surgery [phone]   Bring CPAP mask and tubing only   Eat a light diet the day before surgery. Examples including soups, broths, toast, yogurt, mashed potatoes. Things to avoid include carbonated  beverages (fizzy beverages), raw fruits and raw vegetables, or beans.    If your bowels are filled with gas, your surgeon will have difficulty visualizing your pelvic organs which increases your surgical risks.   Do not eat food or drink liquids :After Midnight.   Do NOT smoke after Midnight   Drink 2 Ensure drinks the night before surgery.  Complete one Ensure drink the morning of surgery 3 hours prior to scheduled surgery.   Take these medicines the morning of surgery with A SIP OF WATER: Carvedilol, Prozac, Ranitidine                               You may not have any metal on your body including hair pins, jewelry, and body piercings             Do not wear make-up, lotions, powders, perfumes/cologne, or deodorant             Do not wear nail polish.  Do not shave  48 hours prior to surgery.               Do not bring valuables to the hospital. [place].   Contacts, dentures or bridgework may not be worn into surgery.   Patients discharged the day of surgery will not be allowed to drive home.   Name and phone number of your driver:   Special Instructions: Bring a copy of your healthcare power of attorney and living will documents         the day of surgery if you haven't scanned them in before.              Please read over the following fact sheets you were given:  [facility] - Preparing for Surgery Before surgery, you can play an important role.  Because skin is not sterile, your skin needs to  be as free of germs as possible.  You can reduce the number of germs on your skin by washing with CHG (chlorahexidine gluconate) soap before surgery.  CHG is an antiseptic cleaner which kills germs and bonds with the skin to continue killing germs even after washing. Please DO NOT use if you have an allergy to CHG or antibacterial soaps.  If your skin becomes reddened/irritated stop using the CHG and inform your nurse when you arrive at Short Stay. Do not shave (including legs and underarms) for at least 48 hours prior to the first CHG shower.  You may shave your face/neck.  Please follow these instructions carefully:  1.  Shower with CHG Soap the night before surgery and the  morning of surgery.  2.  If you choose to wash your hair, wash your hair first as usual with your normal  shampoo.  3.  After you shampoo, rinse your hair and body thoroughly to remove the shampoo.  4.  Use CHG as you would any other liquid soap.  You can apply chg directly to the skin and wash.  Gently with a scrungie or clean washcloth.  5.  Apply the CHG Soap to your body ONLY FROM THE NECK DOWN.   Do   not use on face/ open                           Wound or open sores. Avoid contact with eyes, ears mouth and   genitals (private parts).                       Wash face,  Genitals (private parts) with your normal soap.             6.  Wash thoroughly, paying special attention to the area where your    surgery  will be performed.  7.  Thoroughly rinse your body with warm water from the neck down.  8.  DO NOT shower/wash with your normal soap after using and rinsing off the CHG Soap.                9.  Pat yourself dry with a clean towel.            10.  Wear clean pajamas.            11.  Place clean sheets on your bed the night of your first shower and do not  sleep with pets. Day of Surgery : Do not apply any lotions/deodorants the morning of surgery.  Please wear clean clothes to the  hospital/surgery center.  FAILURE TO FOLLOW THESE INSTRUCTIONS MAY RESULT IN THE CANCELLATION OF YOUR SURGERY  PATIENT SIGNATURE_________________________________  NURSE SIGNATURE__________________________________  ________________________________________________________________________   Adam Phenix  An incentive spirometer is a tool that can help keep your lungs clear and active. This tool measures how well you are filling your lungs with each breath. Taking long deep breaths may help reverse or decrease the chance of developing breathing (pulmonary) problems (especially infection) following:  A long period of time when you are unable to move or be active. BEFORE THE PROCEDURE   If the spirometer includes an indicator to show your best effort, your nurse or respiratory therapist will set it to a desired goal.  If possible, sit up straight or lean slightly forward. Try not to slouch.  Hold the incentive spirometer in an upright position. INSTRUCTIONS FOR USE  1. Sit on the edge of your bed if possible, or sit up as far as you can in bed or on a chair. 2. Hold the incentive spirometer in an upright position. 3. Breathe out normally. 4. Place the mouthpiece in your mouth and seal your lips tightly around it. 5. Breathe in slowly and as deeply as possible, raising the piston or the ball toward the top of the column. 6. Hold your breath for 3-5 seconds or for as long as possible. Allow the piston or ball to fall to the bottom of the column. 7. Remove the mouthpiece from your mouth and breathe out normally. 8. Rest for a few seconds and repeat Steps 1 through 7 at least 10 times every 1-2 hours when you are awake. Take your time and take a few normal breaths between deep breaths. 9. The spirometer may include an indicator to show your best effort. Use the indicator as a goal to work toward during each repetition.  10. After each set of 10 deep breaths, practice coughing to be sure  your lungs are clear. If you have an incision (the cut made at the time of surgery), support your incision when coughing by placing a pillow or rolled up towels firmly against it. Once you are able to get out of bed, walk around indoors and cough well. You may stop using the incentive spirometer when instructed by your caregiver.  RISKS AND COMPLICATIONS  Take your time so you do not get dizzy or light-headed.  If you are in pain, you may need to take or ask for pain medication before doing incentive spirometry. It is harder to take a deep breath if you are having pain. AFTER USE  Rest and breathe slowly and easily.  It can be helpful to keep track of a log of your progress. Your caregiver can provide you with a simple table to help with this. If you are using the spirometer at home, follow these instructions: [place] IF:   You are having difficultly using the spirometer.  You have trouble using the spirometer as often as instructed.  Your pain medication is not giving enough relief while using the spirometer.  You develop fever of 100.5 F (38.1 C) or higher. SEEK IMMEDIATE MEDICAL CARE IF:   You cough up bloody sputum that had not been present before.  You develop fever of 102 F (38.9 C) or greater.  You develop worsening pain at or near the incision site. MAKE SURE YOU:   Understand these instructions.  Will watch your condition.  Will get help right away if you are not doing well or get worse. Document Released: 02/23/2007 Document Revised: 01/05/2012 Document Reviewed: 04/26/2007 [facility] Patient Information 2014 [place], [place].   ________________________________________________________________________

## 2017-11-18 ENCOUNTER — Encounter (HOSPITAL_COMMUNITY): Payer: Self-pay

## 2017-11-18 ENCOUNTER — Encounter (HOSPITAL_COMMUNITY)
Admission: RE | Admit: 2017-11-18 | Discharge: 2017-11-18 | Disposition: A | Discharge status: Home / Self Care | Payer: BLUE CROSS/BLUE SHIELD | Source: Ambulatory Visit | Attending: Gynecologic Oncology | Admitting: Gynecologic Oncology

## 2017-11-18 ENCOUNTER — Other Ambulatory Visit: Payer: Self-pay

## 2017-11-18 DIAGNOSIS — Z85820 Personal history of malignant melanoma of skin: Secondary | ICD-10-CM | POA: Diagnosis not present

## 2017-11-18 DIAGNOSIS — Z01812 Encounter for preprocedural laboratory examination: Secondary | ICD-10-CM | POA: Diagnosis not present

## 2017-11-18 DIAGNOSIS — Z1501 Genetic susceptibility to malignant neoplasm of breast: Secondary | ICD-10-CM | POA: Diagnosis not present

## 2017-11-18 DIAGNOSIS — Z01810 Encounter for preprocedural cardiovascular examination: Secondary | ICD-10-CM | POA: Insufficient documentation

## 2017-11-18 HISTORY — DX: Essential (primary) hypertension: I10

## 2017-11-18 HISTORY — DX: Obesity, class 1: E66.811

## 2017-11-18 HISTORY — DX: Obesity, unspecified: E66.9

## 2017-11-18 LAB — CBC
HCT: 37.6 % (ref 36.0–46.0)
HEMOGLOBIN: 12.8 g/dL (ref 12.0–15.0)
MCH: 30.5 pg (ref 26.0–34.0)
MCHC: 34 g/dL (ref 30.0–36.0)
MCV: 89.7 fL (ref 78.0–100.0)
Platelets: 247 10*3/uL (ref 150–400)
RBC: 4.19 MIL/uL (ref 3.87–5.11)
RDW: 13 % (ref 11.5–15.5)
WBC: 5.1 10*3/uL (ref 4.0–10.5)

## 2017-11-18 LAB — COMPREHENSIVE METABOLIC PANEL
ALBUMIN: 3.7 g/dL (ref 3.5–5.0)
ALK PHOS: 61 U/L (ref 38–126)
ALT: 17 U/L (ref 14–54)
AST: 18 U/L (ref 15–41)
Anion gap: 6 (ref 5–15)
BUN: 16 mg/dL (ref 6–20)
CALCIUM: 8.7 mg/dL — AB (ref 8.9–10.3)
CO2: 26 mmol/L (ref 22–32)
CREATININE: 0.76 mg/dL (ref 0.44–1.00)
Chloride: 109 mmol/L (ref 101–111)
GFR calc Af Amer: >60 mL/min (ref >60)
GFR calc non Af Amer: >60 mL/min (ref >60)
GLUCOSE: 89 mg/dL (ref 65–99)
Potassium: 4.2 mmol/L (ref 3.5–5.1)
SODIUM: 141 mmol/L (ref 135–145)
Total Bilirubin: 0.5 mg/dL (ref 0.3–1.2)
Total Protein: 6.6 g/dL (ref 6.5–8.1)

## 2017-11-18 LAB — ABO/RH: ABO/RH(D): O POS

## 2017-11-18 NOTE — Pre-Procedure Instructions (Signed)
CMP results 11/18/17 faxed to Dr. Denman George via epic.

## 2017-11-24 ENCOUNTER — Encounter (HOSPITAL_COMMUNITY): Payer: Self-pay | Admitting: *Deleted

## 2017-11-24 ENCOUNTER — Ambulatory Visit (HOSPITAL_COMMUNITY): Payer: BLUE CROSS/BLUE SHIELD | Admitting: Anesthesiology

## 2017-11-24 ENCOUNTER — Encounter (HOSPITAL_COMMUNITY)
Admission: RE | Disposition: A | Discharge status: Home / Self Care | Payer: Self-pay | Source: Ambulatory Visit | Attending: Gynecologic Oncology

## 2017-11-24 ENCOUNTER — Ambulatory Visit (HOSPITAL_COMMUNITY)
Admission: RE | Admit: 2017-11-24 | Discharge: 2017-11-24 | Disposition: A | Discharge status: Home / Self Care | Payer: BLUE CROSS/BLUE SHIELD | Source: Ambulatory Visit | Attending: Gynecologic Oncology | Admitting: Gynecologic Oncology

## 2017-11-24 DIAGNOSIS — Z1501 Genetic susceptibility to malignant neoplasm of breast: Secondary | ICD-10-CM | POA: Diagnosis not present

## 2017-11-24 DIAGNOSIS — Z1509 Genetic susceptibility to other malignant neoplasm: Secondary | ICD-10-CM | POA: Diagnosis not present

## 2017-11-24 DIAGNOSIS — J45909 Unspecified asthma, uncomplicated: Secondary | ICD-10-CM | POA: Diagnosis not present

## 2017-11-24 DIAGNOSIS — Z85048 Personal history of other malignant neoplasm of rectum, rectosigmoid junction, and anus: Secondary | ICD-10-CM | POA: Insufficient documentation

## 2017-11-24 DIAGNOSIS — G4730 Sleep apnea, unspecified: Secondary | ICD-10-CM | POA: Diagnosis not present

## 2017-11-24 DIAGNOSIS — Z79899 Other long term (current) drug therapy: Secondary | ICD-10-CM | POA: Insufficient documentation

## 2017-11-24 DIAGNOSIS — I10 Essential (primary) hypertension: Secondary | ICD-10-CM | POA: Diagnosis not present

## 2017-11-24 DIAGNOSIS — F329 Major depressive disorder, single episode, unspecified: Secondary | ICD-10-CM | POA: Insufficient documentation

## 2017-11-24 DIAGNOSIS — R8569 Abnormal cytological findings in specimens from other digestive organs and abdominal cavity: Secondary | ICD-10-CM | POA: Diagnosis not present

## 2017-11-24 DIAGNOSIS — F419 Anxiety disorder, unspecified: Secondary | ICD-10-CM | POA: Insufficient documentation

## 2017-11-24 DIAGNOSIS — Z87891 Personal history of nicotine dependence: Secondary | ICD-10-CM | POA: Insufficient documentation

## 2017-11-24 DIAGNOSIS — J449 Chronic obstructive pulmonary disease, unspecified: Secondary | ICD-10-CM | POA: Insufficient documentation

## 2017-11-24 DIAGNOSIS — Z9189 Other specified personal risk factors, not elsewhere classified: Secondary | ICD-10-CM

## 2017-11-24 DIAGNOSIS — Z4002 Encounter for prophylactic removal of ovary: Secondary | ICD-10-CM | POA: Diagnosis not present

## 2017-11-24 HISTORY — PX: ROBOTIC ASSISTED BILATERAL SALPINGO OOPHERECTOMY: SHX6078

## 2017-11-24 LAB — TYPE AND SCREEN
ABO/RH(D): O POS
Antibody Screen: NEGATIVE

## 2017-11-24 SURGERY — SALPINGO-OOPHORECTOMY, BILATERAL, ROBOT-ASSISTED
Anesthesia: General | Laterality: Bilateral

## 2017-11-24 MED ORDER — LACTATED RINGERS IV SOLN
INTRAVENOUS | Status: DC
  Administered 2017-11-24 (×2): via INTRAVENOUS

## 2017-11-24 MED ORDER — OXYCODONE HCL 5 MG/5ML PO SOLN
5.0000 mg | Freq: Once | ORAL | Status: DC | PRN
  Filled 2017-11-24: qty 5

## 2017-11-24 MED ORDER — LIDOCAINE 2% (20 MG/ML) 5 ML SYRINGE
INTRAMUSCULAR | Status: AC
  Filled 2017-11-24: qty 5

## 2017-11-24 MED ORDER — DEXAMETHASONE SODIUM PHOSPHATE 10 MG/ML IJ SOLN
INTRAMUSCULAR | Status: DC | PRN
  Administered 2017-11-24: 10 mg via INTRAVENOUS

## 2017-11-24 MED ORDER — SCOPOLAMINE 1 MG/3DAYS TD PT72
1.0000 | MEDICATED_PATCH | TRANSDERMAL | Status: DC
  Administered 2017-11-24: 1.5 mg via TRANSDERMAL

## 2017-11-24 MED ORDER — ONDANSETRON HCL 4 MG/2ML IJ SOLN
INTRAMUSCULAR | Status: AC
  Filled 2017-11-24: qty 2

## 2017-11-24 MED ORDER — LACTATED RINGERS IV SOLN
INTRAVENOUS | Status: AC | PRN
  Administered 2017-11-24: 1000 mL

## 2017-11-24 MED ORDER — ACETAMINOPHEN 500 MG PO TABS
1000.0000 mg | ORAL_TABLET | ORAL | Status: AC
  Administered 2017-11-24: 1000 mg via ORAL

## 2017-11-24 MED ORDER — DEXAMETHASONE SODIUM PHOSPHATE 4 MG/ML IJ SOLN: 4.0000 mg | INTRAMUSCULAR | Status: DC

## 2017-11-24 MED ORDER — GABAPENTIN 300 MG PO CAPS
300.0000 mg | ORAL_CAPSULE | ORAL | Status: AC
  Administered 2017-11-24: 300 mg via ORAL

## 2017-11-24 MED ORDER — ROCURONIUM BROMIDE 50 MG/5ML IV SOSY
PREFILLED_SYRINGE | INTRAVENOUS | Status: AC
  Filled 2017-11-24: qty 5

## 2017-11-24 MED ORDER — PROPOFOL 10 MG/ML IV BOLUS
INTRAVENOUS | Status: AC
  Filled 2017-11-24: qty 20

## 2017-11-24 MED ORDER — OXYCODONE-ACETAMINOPHEN 5-325 MG PO TABS: 1.0000 | ORAL_TABLET | ORAL | 0 refills | Status: DC | PRN

## 2017-11-24 MED ORDER — LIDOCAINE 2% (20 MG/ML) 5 ML SYRINGE
INTRAMUSCULAR | Status: DC | PRN
  Administered 2017-11-24: 100 mg via INTRAVENOUS

## 2017-11-24 MED ORDER — KETOROLAC TROMETHAMINE 15 MG/ML IJ SOLN
15.0000 mg | INTRAMUSCULAR | Status: AC
  Filled 2017-11-24: qty 1

## 2017-11-24 MED ORDER — KETOROLAC TROMETHAMINE 30 MG/ML IJ SOLN
INTRAMUSCULAR | Status: AC
  Administered 2017-11-24: 15 mg
  Filled 2017-11-24: qty 1

## 2017-11-24 MED ORDER — OXYCODONE HCL 5 MG PO TABS: 5.0000 mg | ORAL_TABLET | Freq: Once | ORAL | Status: DC | PRN

## 2017-11-24 MED ORDER — ROCURONIUM BROMIDE 50 MG/5ML IV SOSY
PREFILLED_SYRINGE | INTRAVENOUS | Status: DC | PRN
  Administered 2017-11-24: 50 mg via INTRAVENOUS

## 2017-11-24 MED ORDER — PROPOFOL 10 MG/ML IV BOLUS
INTRAVENOUS | Status: DC | PRN
  Administered 2017-11-24: 150 mg via INTRAVENOUS

## 2017-11-24 MED ORDER — MEPERIDINE HCL 50 MG/ML IJ SOLN: 6.2500 mg | INTRAMUSCULAR | Status: DC | PRN

## 2017-11-24 MED ORDER — MIDAZOLAM HCL 2 MG/2ML IJ SOLN
INTRAMUSCULAR | Status: DC | PRN
  Administered 2017-11-24: 2 mg via INTRAVENOUS

## 2017-11-24 MED ORDER — CEFAZOLIN SODIUM-DEXTROSE 2-4 GM/100ML-% IV SOLN
INTRAVENOUS | Status: AC
Start: 2017-11-24 — End: 2017-11-24
  Filled 2017-11-24: qty 100

## 2017-11-24 MED ORDER — MORPHINE SULFATE (PF) 4 MG/ML IV SOLN: 2.0000 mg | INTRAVENOUS | Status: DC | PRN

## 2017-11-24 MED ORDER — GABAPENTIN 300 MG PO CAPS
ORAL_CAPSULE | ORAL | Status: AC
  Administered 2017-11-24: 300 mg via ORAL
  Filled 2017-11-24: qty 1

## 2017-11-24 MED ORDER — ACETAMINOPHEN 500 MG PO TABS
ORAL_TABLET | ORAL | Status: AC
  Administered 2017-11-24: 1000 mg via ORAL
  Filled 2017-11-24: qty 2

## 2017-11-24 MED ORDER — DEXAMETHASONE SODIUM PHOSPHATE 10 MG/ML IJ SOLN
INTRAMUSCULAR | Status: AC
  Filled 2017-11-24: qty 1

## 2017-11-24 MED ORDER — SCOPOLAMINE 1 MG/3DAYS TD PT72
MEDICATED_PATCH | TRANSDERMAL | Status: AC
  Administered 2017-11-24: 1.5 mg via TRANSDERMAL
  Filled 2017-11-24: qty 1

## 2017-11-24 MED ORDER — HYDROMORPHONE HCL 1 MG/ML IJ SOLN
0.2500 mg | INTRAMUSCULAR | Status: DC | PRN
  Administered 2017-11-24 (×2): 0.5 mg via INTRAVENOUS

## 2017-11-24 MED ORDER — HYDROMORPHONE HCL 1 MG/ML IJ SOLN
INTRAMUSCULAR | Status: AC
  Filled 2017-11-24: qty 1

## 2017-11-24 MED ORDER — ACETAMINOPHEN 325 MG PO TABS: 650.0000 mg | ORAL_TABLET | ORAL | Status: DC | PRN

## 2017-11-24 MED ORDER — ONDANSETRON HCL 4 MG/2ML IJ SOLN
INTRAMUSCULAR | Status: DC | PRN
  Administered 2017-11-24: 4 mg via INTRAVENOUS

## 2017-11-24 MED ORDER — SILVER NITRATE-POT NITRATE 75-25 % EX MISC
CUTANEOUS | Status: DC | PRN
  Administered 2017-11-24: 1

## 2017-11-24 MED ORDER — FENTANYL CITRATE (PF) 100 MCG/2ML IJ SOLN
INTRAMUSCULAR | Status: AC
  Filled 2017-11-24: qty 2

## 2017-11-24 MED ORDER — SILVER NITRATE-POT NITRATE 75-25 % EX MISC
CUTANEOUS | Status: AC
  Filled 2017-11-24: qty 1

## 2017-11-24 MED ORDER — MIDAZOLAM HCL 2 MG/2ML IJ SOLN
INTRAMUSCULAR | Status: AC
  Filled 2017-11-24: qty 2

## 2017-11-24 MED ORDER — PROMETHAZINE HCL 25 MG/ML IJ SOLN: 6.2500 mg | INTRAMUSCULAR | Status: DC | PRN

## 2017-11-24 MED ORDER — ACETAMINOPHEN 650 MG RE SUPP
650.0000 mg | RECTAL | Status: DC | PRN
  Filled 2017-11-24: qty 1

## 2017-11-24 MED ORDER — SUCCINYLCHOLINE CHLORIDE 200 MG/10ML IV SOSY
PREFILLED_SYRINGE | INTRAVENOUS | Status: DC | PRN
  Administered 2017-11-24: 100 mg via INTRAVENOUS

## 2017-11-24 MED ORDER — STERILE WATER FOR IRRIGATION IR SOLN
Status: DC | PRN
  Administered 2017-11-24: 1000 mL

## 2017-11-24 MED ORDER — OXYCODONE HCL 5 MG PO TABS: 5.0000 mg | ORAL_TABLET | ORAL | Status: DC | PRN

## 2017-11-24 MED ORDER — SUGAMMADEX SODIUM 200 MG/2ML IV SOLN
INTRAVENOUS | Status: DC | PRN
  Administered 2017-11-24: 200 mg via INTRAVENOUS

## 2017-11-24 MED ORDER — FENTANYL CITRATE (PF) 100 MCG/2ML IJ SOLN
INTRAMUSCULAR | Status: DC | PRN
  Administered 2017-11-24: 100 ug via INTRAVENOUS

## 2017-11-24 MED ORDER — SUGAMMADEX SODIUM 200 MG/2ML IV SOLN
INTRAVENOUS | Status: AC
  Filled 2017-11-24: qty 2

## 2017-11-24 MED ORDER — CEFAZOLIN SODIUM-DEXTROSE 2-4 GM/100ML-% IV SOLN
2.0000 g | INTRAVENOUS | Status: AC
  Administered 2017-11-24: 2 g via INTRAVENOUS

## 2017-11-24 SURGICAL SUPPLY — 45 items
ADH SKN CLS APL DERMABOND .7 (GAUZE/BANDAGES/DRESSINGS) ×1
BAG LAPAROSCOPIC 12 15 PORT 16 (BASKET) IMPLANT
BAG RETRIEVAL 12/15 (BASKET)
BAG SPEC RTRVL LRG 6X4 10 (ENDOMECHANICALS)
CATH FOLEY 2WAY SLVR  5CC 12FR (CATHETERS) ×1
CATH FOLEY 2WAY SLVR 5CC 12FR (CATHETERS) ×1 IMPLANT
CHLORAPREP W/TINT 26ML (MISCELLANEOUS) ×2 IMPLANT
COVER BACK TABLE 60X90IN (DRAPES) ×2 IMPLANT
COVER TIP SHEARS 8 DVNC (MISCELLANEOUS) ×1 IMPLANT
COVER TIP SHEARS 8MM DA VINCI (MISCELLANEOUS) ×1
DERMABOND ADVANCED (GAUZE/BANDAGES/DRESSINGS) ×1
DERMABOND ADVANCED .7 DNX12 (GAUZE/BANDAGES/DRESSINGS) ×1 IMPLANT
DRAPE ARM DVNC X/XI (DISPOSABLE) ×4 IMPLANT
DRAPE COLUMN DVNC XI (DISPOSABLE) ×1 IMPLANT
DRAPE DA VINCI XI ARM (DISPOSABLE) ×4
DRAPE DA VINCI XI COLUMN (DISPOSABLE) ×1
DRAPE SHEET LG 3/4 BI-LAMINATE (DRAPES) ×4 IMPLANT
DRAPE SURG IRRIG POUCH 19X23 (DRAPES) ×2 IMPLANT
ELECT REM PT RETURN 15FT ADLT (MISCELLANEOUS) ×2 IMPLANT
GLOVE BIO SURGEON STRL SZ 6 (GLOVE) ×8 IMPLANT
GLOVE BIO SURGEON STRL SZ 6.5 (GLOVE) ×4 IMPLANT
GOWN STRL REUS W/ TWL LRG LVL3 (GOWN DISPOSABLE) ×3 IMPLANT
GOWN STRL REUS W/TWL LRG LVL3 (GOWN DISPOSABLE) ×6
HOLDER FOLEY CATH W/STRAP (MISCELLANEOUS) ×2 IMPLANT
IRRIG SUCT STRYKERFLOW 2 WTIP (MISCELLANEOUS) ×2
IRRIGATION SUCT STRKRFLW 2 WTP (MISCELLANEOUS) ×1 IMPLANT
MANIPULATOR UTERINE 4.5 ZUMI (MISCELLANEOUS) IMPLANT
OBTURATOR OPTICAL STANDARD 8MM (TROCAR) ×1
OBTURATOR OPTICAL STND 8 DVNC (TROCAR) ×1
OBTURATOR OPTICALSTD 8 DVNC (TROCAR) ×1 IMPLANT
PACK ROBOT GYN CUSTOM WL (TRAY / TRAY PROCEDURE) ×2 IMPLANT
PAD POSITIONING PINK XL (MISCELLANEOUS) ×2 IMPLANT
POUCH SPECIMEN RETRIEVAL 10MM (ENDOMECHANICALS) IMPLANT
SEAL CANN UNIV 5-8 DVNC XI (MISCELLANEOUS) ×4 IMPLANT
SEAL XI 5MM-8MM UNIVERSAL (MISCELLANEOUS) ×4
SET TRI-LUMEN FLTR TB AIRSEAL (TUBING) IMPLANT
SOLUTION ELECTROLUBE (MISCELLANEOUS) ×2 IMPLANT
SUT MNCRL AB 4-0 PS2 18 (SUTURE) ×4 IMPLANT
SUT VIC AB 0 CT1 27 (SUTURE)
SUT VIC AB 0 CT1 27XBRD ANTBC (SUTURE) IMPLANT
TOWEL OR NON WOVEN STRL DISP B (DISPOSABLE) ×2 IMPLANT
TRAP SPECIMEN MUCOUS 40CC (MISCELLANEOUS) IMPLANT
TRAY FOLEY W/METER SILVER 16FR (SET/KITS/TRAYS/PACK) ×2 IMPLANT
UNDERPAD 30X30 (UNDERPADS AND DIAPERS) ×2 IMPLANT
WATER STERILE IRR 1000ML POUR (IV SOLUTION) ×2 IMPLANT

## 2017-11-24 NOTE — Transfer of Care (Signed)
Immediate Anesthesia Transfer of Care Note  Patient: Sheila Fuller  Procedure(s) Performed: XI ROBOTIC ASSISTED BILATERAL SALPINGO OOPHORECTOMY (Bilateral )  Patient Location: PACU  Anesthesia Type:General  Level of Consciousness: awake and patient cooperative  Airway & Oxygen Therapy: Patient Spontanous Breathing and Patient connected to face mask oxygen  Post-op Assessment: Report given to RN and Post -op Vital signs reviewed and stable  Post vital signs: Reviewed and stable  Last Vitals:  Vitals:   11/24/17 0937  BP: (!) 159/98  Pulse: 71  Resp: 16  Temp: 36.8 C  SpO2: 100%    Last Pain:  Vitals:   11/24/17 0937  TempSrc: Oral         Complications: No apparent anesthesia complications

## 2017-11-24 NOTE — Op Note (Signed)
OPERATIVE NOTE  Date: 11/24/17  Preoperative Diagnosis: BRCA 1 germline mutation   Postoperative Diagnosis:  same  Procedure(s) Performed: Robotic-assisted laparoscopic bilateral salpingo-oophorectomy  Surgeon: Everitt Amber, M.D.  Assistant Surgeon: Lahoma Crocker M.D. (an MD assistant was necessary for tissue manipulation, management of robotic instrumentation, retraction and positioning due to the complexity of the case and hospital policies).   Anesthesia: Gen. endotracheal.  Specimens: Bilateral ovaries, fallopian tubes, pelvic washings  Estimated Blood Loss: 10 mL. Blood Replacement: None  Complications: none  Indication for Procedure:  BRCA 1 germline mutation  Operative Findings: normal 6cm uterus. Normal small postmenopausal ovaries bilaterally. Normal upper abdomen. Normal omentum. No ascites.  Procedure: The patient's taken to the operating room and placed under general endotracheal anesthesia testing difficulty. She is placed in a dorsolithotomy position and cervical acromial pad was placed. The arms were tucked with care taken to pad the olecranon process. And prepped and draped in usual sterile fashion. A uterine manipulator (zumi) was placed vaginally. A 25m incision was made in the left upper quadrant palmer's point and a 5 mm Optiview trocar used to enter the abdomen under direct visualization. With entry into the abdomen and then maintenance of 15 mm of mercury the patient was placed in Trendelenburg position. An incision was made in the umbilicus and a 129MStrochar was placed through this site. Two incisions were made lateral to the umbilical incision in the left and right abdomen measuring 867m These incisions were made approximately 10 cm lateral to the umbilical incision. 8 mm robotic trochars were inserted. The robot was docked.  The abdomen was inspected as was the pelvis.  Pelvic washings were obtained. An incision was made on the right pelvic side wall peritoneum  parallel to the IP ligament and the retroperitoneal space entered. The right ureter was identified and the para-rectal space was developed. A window was created in the right broad ligament above the ureter. The right infundibulopelvic vessels were skeletonized cauterized and transected. The utero-ovarian ligaments similarly were cauterized and transected. Specimen was placed in an Endo Catch bag.  In a similar manner the left peritoneum and the side wall was incised, and the retroperitoneal space entered. The left ureter was identified and the left pararectal space was developed. The utero-ovarian ligament was skeletonized cauterized and transected. The left utero-ovarian ligaments were cauterized and transected in the left adnexa was placed in an Endo Catch bag.  The abdomen was copiously irrigated and drained and all operative sites inspected and hemostasis was assured   The robot was undocked. The left and right Endo Catch bags were removed from the abdominal cavity through the left upper quadrant incision.   The ports were all remove. The fascial closure at the umbilical incision and left upper quadrant port was made with 0 Vicryl.  All incisions were closed with a running subcuticular Monocryl suture. Dermabond was applied. Sponge, lap and needle counts were correct x 3.    The patient had sequential compression devices for VTE prophylaxis.         Disposition: PACU          Condition: stable  RoDonaciano EvaMD

## 2017-11-24 NOTE — Anesthesia Postprocedure Evaluation (Signed)
Anesthesia Post Note  Patient: Sheila Fuller  Procedure(s) Performed: XI ROBOTIC ASSISTED BILATERAL SALPINGO OOPHORECTOMY (Bilateral )     Patient location during evaluation: PACU Anesthesia Type: General Level of consciousness: awake and alert Pain management: pain level controlled Vital Signs Assessment: post-procedure vital signs reviewed and stable Respiratory status: spontaneous breathing, nonlabored ventilation and respiratory function stable Cardiovascular status: blood pressure returned to baseline and stable Postop Assessment: no apparent nausea or vomiting Anesthetic complications: no    Last Vitals:  Vitals:   11/24/17 1334 11/24/17 1345  BP: 134/82 140/84  Pulse: 69 65  Resp: 15 15  Temp: 36.8 C   SpO2: 95% 100%    Last Pain:  Vitals:   11/24/17 1334  TempSrc:   PainSc: Asleep                 Lynda Rainwater

## 2017-11-24 NOTE — Anesthesia Preprocedure Evaluation (Signed)
Anesthesia Evaluation  Patient identified by MRN, date of birth, ID band Patient awake    Reviewed: Allergy & Precautions, NPO status , Patient's Chart, lab work & pertinent test results, reviewed documented beta blocker date and time   Airway Mallampati: II  TM Distance: >3 FB Neck ROM: Full    Dental no notable dental hx. (+) Teeth Intact   Pulmonary neg pulmonary ROS, asthma , sleep apnea , COPD,  COPD inhaler, former smoker,    Pulmonary exam normal breath sounds clear to auscultation       Cardiovascular hypertension, Pt. on medications and Pt. on home beta blockers negative cardio ROS Normal cardiovascular exam Rhythm:Regular Rate:Normal  Normal stress test 2013   Neuro/Psych Anxiety Depression negative neurological ROS  negative psych ROS   GI/Hepatic negative GI ROS, Neg liver ROS, GERD  Controlled,  Endo/Other  negative endocrine ROS  Renal/GU negative Renal ROS  negative genitourinary   Musculoskeletal negative musculoskeletal ROS (+) Arthritis , Osteoarthritis,    Abdominal (+)  Abdomen: soft.    Peds negative pediatric ROS (+)  Hematology negative hematology ROS (+)   Anesthesia Other Findings   Reproductive/Obstetrics negative OB ROS                             Anesthesia Physical  Anesthesia Plan  ASA: III  Anesthesia Plan: General   Post-op Pain Management:    Induction: Intravenous  PONV Risk Score and Plan: 3 and Ondansetron, Dexamethasone and Midazolam  Airway Management Planned: Oral ETT  Additional Equipment:   Intra-op Plan:   Post-operative Plan: Extubation in OR  Informed Consent: I have reviewed the patients History and Physical, chart, labs and discussed the procedure including the risks, benefits and alternatives for the proposed anesthesia with the patient or authorized representative who has indicated his/her understanding and acceptance.      Plan Discussed with:   Anesthesia Plan Comments:         Anesthesia Quick Evaluation

## 2017-11-24 NOTE — Discharge Instructions (Signed)
General Anesthesia, Adult, Care After °These instructions provide you with information about caring for yourself after your procedure. Your health care provider may also give you more specific instructions. Your treatment has been planned according to current medical practices, but problems sometimes occur. Call your health care provider if you have any problems or questions after your procedure. °What can I expect after the procedure? °After the procedure, it is common to have: °· Vomiting. °· A sore throat. °· Mental slowness. ° °It is common to feel: °· Nauseous. °· Cold or shivery. °· Sleepy. °· Tired. °· Sore or achy, even in parts of your body where you did not have surgery. ° °Follow these instructions at home: °For at least 24 hours after the procedure: °· Do not: °? Participate in activities where you could fall or become injured. °? Drive. °? Use heavy machinery. °? Drink alcohol. °? Take sleeping pills or medicines that cause drowsiness. °? Make important decisions or sign legal documents. °? Take care of children on your own. °· Rest. °Eating and drinking °· If you vomit, drink water, juice, or soup when you can drink without vomiting. °· Drink enough fluid to keep your urine clear or pale yellow. °· Make sure you have little or no nausea before eating solid foods. °· Follow the diet recommended by your health care provider. °General instructions °· Have a responsible adult stay with you until you are awake and alert. °· Return to your normal activities as told by your health care provider. Ask your health care provider what activities are safe for you. °· Take over-the-counter and prescription medicines only as told by your health care provider. °· If you smoke, do not smoke without supervision. °· Keep all follow-up visits as told by your health care provider. This is important. °Contact a health care provider if: °· You continue to have nausea or vomiting at home, and medicines are not helpful. °· You  cannot drink fluids or start eating again. °· You cannot urinate after 8-12 hours. °· You develop a skin rash. °· You have fever. °· You have increasing redness at the site of your procedure. °Get help right away if: °· You have difficulty breathing. °· You have chest pain. °· You have unexpected bleeding. °· You feel that you are having a life-threatening or urgent problem. °This information is not intended to replace advice given to you by your health care provider. Make sure you discuss any questions you have with your health care provider. °Document Released: 01/19/2001 Document Revised: 03/17/2016 Document Reviewed: 09/27/2015 °Elsevier Interactive Patient Education © 2018 [employer] ° °

## 2017-11-24 NOTE — Anesthesia Procedure Notes (Addendum)
Procedure Name: Intubation Date/Time: 11/24/2017 12:26 PM Performed by: Dione Booze, CRNA Pre-anesthesia Checklist: Suction available, Patient being monitored, Emergency Drugs available and Patient identified Patient Re-evaluated:Patient Re-evaluated prior to induction Oxygen Delivery Method: Circle system utilized Preoxygenation: Pre-oxygenation with 100% oxygen Induction Type: IV induction Ventilation: Mask ventilation without difficulty Laryngoscope Size: Mac and 4 Grade View: Grade II Tube type: Oral Tube size: 7.5 mm Number of attempts: 2 Airway Equipment and Method: Stylet and Bougie stylet Placement Confirmation: ETT inserted through vocal cords under direct vision,  positive ETCO2 and breath sounds checked- equal and bilateral Secured at: 21 cm Tube secured with: Tape Dental Injury: Teeth and Oropharynx as per pre-operative assessment  Difficulty Due To: Difficulty was anticipated, Difficult Airway- due to reduced neck mobility, Difficult Airway- due to limited oral opening and Difficult Airway- due to anterior larynx Comments: Previous glidescope anesthesia, available in room. Cords visualized, 2nd pass with bougie.

## 2017-11-24 NOTE — H&P (Signed)
H&P NOTE  Consult was requested by Dr. Jana Hakim for the evaluation of Sheila Fuller 62 y.o. female  CC:     Chief Complaint  Patient presents with  . BRCA1 positive    Assessment/Plan:  Sheila Fuller  is a 63 y.o.  year old with BRCA 1 deleterious mutation.  We discussed the options for intervention with risk reducing surgery with BSO. I discussed that hysterectomy is an option because there is some increased risk for serous endometrial cancer reported among BRCA 1 mutation carriers. However, the patient is very concerned about increased operative risks given her history of whole pelvic RT in 2001 for anal cancer.  I think it is reasonable to restrict her surgery to robotic assisted BSO.  I discussed operative risks including  bleeding, infection, damage to internal organs (such as bladder,ureters, bowels), blood clot, reoperation and rehospitalization. I discussed anticipated recovery time.  HPI: Sheila Fuller is a 62 year old P1 who is seen in consultation at the request of Dr Jana Hakim for a deleterious mutation in BRCA 1.  The patient's daughter received genetic testing in her OBGYN's office and was incidentally found to have a mutation in BRCA 1. The patient then underwent subsequent testing which confirmed the same mutation. She has no personal history of breast or ovarian cancer, though she does have a history of anal cancer in 2001 treated with chemoradiation (NED).   She has had one prior cesarean section. She has been menopausal since radiation. No history of abnormal cesarean sections.   Current Meds:      Outpatient Encounter Medications as of 10/12/2017  Medication Sig  . albuterol (PROVENTIL HFA;VENTOLIN HFA) 108 (90 Base) MCG/ACT inhaler Inhale 2 puffs into the lungs every 4 (four) hours as needed for wheezing or shortness of breath.  Marland Kitchen amLODipine (NORVASC) 10 MG tablet Take 1 tablet (10 mg total) by mouth daily.  Marland Kitchen azelastine (ASTELIN) 0.1 % nasal  spray Place 2 sprays into both nostrils at bedtime. Use in each nostril as directed  . cyclobenzaprine (FLEXERIL) 5 MG tablet Take 1 tablet (5 mg total) at bedtime as needed by mouth for muscle spasms.  Marland Kitchen desloratadine (CLARINEX) 5 MG tablet   . FLUoxetine (PROZAC) 20 MG capsule Take 1 capsule (20 mg total) by mouth daily.  Marland Kitchen losartan (COZAAR) 100 MG tablet Take 1 tablet (100 mg total) by mouth daily.  . naproxen (NAPROSYN) 500 MG tablet Take 1 tablet (500 mg total) daily as needed by mouth for moderate pain.  Marland Kitchen Phenylephrine-APAP-Guaifenesin (MUCINEX SINUS-MAX PO) Take by mouth 4 (four) times daily as needed.  . ranitidine (ZANTAC) 75 MG tablet Take 75 mg by mouth at bedtime.  . phenylephrine (SUDAFED PE) 10 MG TABS tablet Take 10 mg by mouth every 4 (four) hours as needed.  . [DISCONTINUED] fluticasone (FLONASE) 50 MCG/ACT nasal spray Place 2 sprays into both nostrils daily.      Facility-Administered Encounter Medications as of 10/12/2017  Medication  . 0.9 %  sodium chloride infusion    Allergy:       Allergies  Allergen Reactions  . Statins     REACTION: myalgia  . Sulfonamide Derivatives Rash    Social Hx:   Social History        Socioeconomic History  . Marital status: Married    Spouse name: Not on file  . Number of children: 1  . Years of education: Not on file  . Highest education level: Not on file  Social Needs  . Financial resource strain: Not on file  . Food insecurity - worry: Not on file  . Food insecurity - inability: Not on file  . Transportation needs - medical: Not on file  . Transportation needs - non-medical: Not on file  Occupational History  . 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 Smoking status: Former Smoker    Packs/day: 0.30    Years: 3.00    Pack years: 0.90    Types: Cigarettes  . Smokeless tobacco: Never Used  . Tobacco comment: quit 04-2013 ([facility])  Substance and Sexual  Activity  . Alcohol use: Yes    Alcohol/week: 1.8 oz    Types: 3 Standard drinks or equivalent per week    Comment: a week   . Drug use: No  . Sexual activity: Yes  Other Topics Concern  . Not on file  Social History Narrative   remarried 10-11, 1 child    Past Surgical Hx:       Past Surgical History:  Procedure Laterality Date  . ANTERIOR CERVICAL DECOMP/DISCECTOMY FUSION N/A 08/23/2014   Procedure: C5-6 Anterior Cervical Discectomy and Fusion, Allograft, Plate;  Surgeon: Marybelle Killings, MD;  Location: [facility];  Service: Orthopedics;  Laterality: N/A;  . cervical cauterization    . CESAREAN SECTION    . COLONOSCOPY    . GANGLION CYST EXCISION    . NASAL SEPTUM SURGERY    . POLYPECTOMY      Past Medical Hx:      Past Medical History:  Diagnosis Date  . Allergic rhinitis   . Allergy   . Anxiety   . Arthritis   . Asthma    seasonally  . BRCA1 positive   . Cancer ([place])    skin  . 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   . Cataract    will remove left end of 10-2016  . Depression   . Family history of BRCA gene mutation    daughter BRCA1 +  . Family history of breast cancer   . Family history of pancreatic cancer   . GERD (gastroesophageal reflux disease)    takes zantac maybe 2/week  . Hyperlipidemia   . Lactose intolerance 10/04/2013  . Microhematuria 01/2006   saw Dr.Peterson, renal u/s: r pyelocaliectasis but CT was neg, cysto neg  . Shortness of breath    only with asthma flare up  . Sleep apnea    rarely uses cpap    Past Gynecological History:  Cesarean section x 1 No LMP recorded. Patient is postmenopausal.  Family Hx:       Family History  Problem Relation Age of Onset  . Lung cancer Father 61       hx smoking  . Diabetes Mother        late onset  . Hyperlipidemia Mother   . Cancer Mother 48       metholosmia  . Cervical cancer Sister 45   . Cancer Maternal Aunt 60       colon/rectal  . Cancer Paternal Uncle        lung- hx smoking , died 54's  . BRCA 1/2 Daughter        had prophlactic bilat mastectomy  . Esophageal cancer Brother 40  . Breast cancer Paternal Aunt 64  . Breast cancer Paternal Aunt 20  . Lung cancer Paternal  Uncle   . Pancreatic cancer Paternal Uncle 45  . Cancer Cousin        type unk, dx <50  . Coronary artery disease Neg Hx   . Hypertension Neg Hx   . Stroke Neg Hx   . Sudden death Neg Hx   . Heart attack Neg Hx   . Colon polyps Neg Hx   . Rectal cancer Neg Hx   . Stomach cancer Neg Hx     Review of Systems:  Constitutional  Feels well,    ENT Normal appearing ears and nares bilaterally Skin/Breast  No rash, sores, jaundice, itching, dryness Cardiovascular  No chest pain, shortness of breath, or edema  Pulmonary  No cough or wheeze.  Gastro Intestinal  No nausea, vomitting, or diarrhoea. No bright red blood per rectum, no abdominal pain, change in bowel movement, or constipation.  Genito Urinary  No frequency, urgency, dysuria, no bleeding Musculo Skeletal  No myalgia, arthralgia, joint swelling or pain  Neurologic  No weakness, numbness, change in gait,  Psychology  No depression, anxiety, insomnia.   Vitals:  Blood pressure (!) 141/81, pulse 87, temperature 98.3 F (36.8 C), temperature source Oral, resp. rate 18, height _0  (1.727 m), weight 187 lb 14.4 oz (85.2 kg), SpO2 97 %.  Physical Exam: WD in NAD Neck  Supple NROM, without any enlargements.  Lymph Node Survey No cervical supraclavicular or inguinal adenopathy Cardiovascular  Pulse normal rate, regularity and rhythm. S1 and S2 normal.  Lungs  Clear to auscultation bilateraly, without wheezes/crackles/rhonchi. Good air movement.  Skin  No rash/lesions/breakdown  Psychiatry  Alert and oriented to person, place, and time  Abdomen  Normoactive bowel sounds, abdomen soft, non-tender and  nonobese without evidence of hernia.  Back No CVA tenderness Genito Urinary  Vulva/vagina: Normal external female genitalia.   No lesions. No discharge or bleeding.             Bladder/urethra:  No lesions or masses, well supported bladder             Vagina: normal, atrophic             Cervix: Normal appearing, no lesions. Atrophic.             Uterus:  Small, mobile, no parametrial involvement or nodularity.             Adnexa: no masses. Rectal  Good tone, no masses no cul de sac nodularity.  Extremities  No bilateral cyanosis, clubbing or edema.   Donaciano Eva, MD

## 2017-11-25 ENCOUNTER — Encounter (HOSPITAL_COMMUNITY): Payer: Self-pay | Admitting: Gynecologic Oncology

## 2017-11-25 ENCOUNTER — Other Ambulatory Visit: Payer: Self-pay | Admitting: Internal Medicine

## 2017-11-27 ENCOUNTER — Telehealth: Payer: Self-pay | Admitting: *Deleted

## 2017-11-27 NOTE — Telephone Encounter (Signed)
Called and left the patient a message to call the office back regarding her appt on February 22nd

## 2017-11-30 ENCOUNTER — Telehealth: Payer: Self-pay | Admitting: *Deleted

## 2017-11-30 NOTE — Telephone Encounter (Signed)
Called and spoke with the patient. Moved her appt from February 22nd to February 25th.

## 2017-12-08 ENCOUNTER — Other Ambulatory Visit: Payer: Self-pay | Admitting: Internal Medicine

## 2017-12-08 MED ORDER — FLUOXETINE HCL 20 MG PO CAPS: 20.0000 mg | ORAL_CAPSULE | Freq: Every day | ORAL | 0 refills | Status: DC

## 2017-12-08 NOTE — Telephone Encounter (Signed)
Rx sent 

## 2017-12-08 NOTE — Telephone Encounter (Signed)
Copied from [place] [id number]. Topic: Quick Communication - Rx Refill/Question >> Dec 08, 2017 10:21 AM [place], Sheila Fuller wrote: Medication:  FLUoxetine (PROZAC) 20 MG capsule [[id number]]    Has the patient contacted their pharmacy? Yes.     (Agent: If no, request that the patient contact the pharmacy for the refill.)   Preferred Pharmacy (with phone number or street name): [facility], [place] [address], [phone]  Pt states that she is out of town and would like to know if she could get at least 7 pills until she returns home.   Agent: Please be advised that RX refills may take up to 3 business days. We ask that you follow-up with your pharmacy.

## 2017-12-18 ENCOUNTER — Ambulatory Visit: Payer: BLUE CROSS/BLUE SHIELD | Admitting: Gynecologic Oncology

## 2017-12-21 ENCOUNTER — Encounter: Payer: Self-pay | Admitting: Gynecologic Oncology

## 2017-12-21 ENCOUNTER — Encounter (INDEPENDENT_AMBULATORY_CARE_PROVIDER_SITE_OTHER): Payer: Self-pay

## 2017-12-21 ENCOUNTER — Inpatient Hospital Stay: Payer: BLUE CROSS/BLUE SHIELD | Attending: Gynecologic Oncology | Admitting: Gynecologic Oncology

## 2017-12-21 VITALS — BP 140/96 | HR 80 | Temp 98.2°F | Resp 18 | Ht 68.0 in | Wt 191.1 lb

## 2017-12-21 DIAGNOSIS — Z1509 Genetic susceptibility to other malignant neoplasm: Secondary | ICD-10-CM | POA: Diagnosis not present

## 2017-12-21 DIAGNOSIS — Z90722 Acquired absence of ovaries, bilateral: Secondary | ICD-10-CM | POA: Insufficient documentation

## 2017-12-21 DIAGNOSIS — Z1501 Genetic susceptibility to malignant neoplasm of breast: Secondary | ICD-10-CM

## 2017-12-21 NOTE — Patient Instructions (Signed)
No follow up necessary at this time with Dr. Denman George.  Please call for any questions or concerns.

## 2017-12-21 NOTE — Progress Notes (Signed)
  HPI:  Sheila Fuller is a 62 y.o. year old [id number] initially seen in consultation on 10/12/17 for BRCA 1 germline mutation .  She then underwent a robotic BSO on 2/67/12 without complications. She had declined hysterectomy as part of the procedure due to her history of prior radiation and concern for possible complications. Interestingly her pelvic has minimal signicant changes from radiation. Her postoperative course was uncomplicated.  Her final pathology revealed bilaterally normal ovaries.  She is seen today for a postoperative check and to discuss her pathology results and ongoing plan.  Since discharge from the hospital, she is feeling well.  She has improving appetite, normal bowel and bladder function, and pain controlled with minimal PO medication. She has no other complaints today.    Review of systems: Constitutional:  She has no weight gain or weight loss. She has no fever or chills. Eyes: No blurred vision Ears, Nose, Mouth, Throat: No dizziness, headaches or changes in hearing. No mouth sores. Cardiovascular: No chest pain, palpitations or edema. Respiratory:  No shortness of breath, wheezing or cough Gastrointestinal: She has normal bowel movements without diarrhea or constipation. She denies any nausea or vomiting. She denies blood in her stool or heart burn. Genitourinary:  She denies pelvic pain, pelvic pressure or changes in her urinary function. She has no hematuria, dysuria, or incontinence. She has no irregular vaginal bleeding or vaginal discharge Musculoskeletal: Denies muscle weakness or joint pains.  Skin:  She has no skin changes, rashes or itching Neurological:  Denies dizziness or headaches. No neuropathy, no numbness or tingling. Psychiatric:  She denies depression or anxiety. Hematologic/Lymphatic:   No easy bruising or bleeding   Physical Exam: Blood pressure (!) 140/96, pulse 80, temperature 98.2 F (36.8 C), temperature source Oral, resp. rate 18, height 5'  8" (1.727 m), weight 191 lb 1.6 oz (86.7 kg), SpO2 98 %. General: Well dressed, well nourished in no apparent distress.   HEENT:  Normocephalic and atraumatic, no lesions.  Extraocular muscles intact. Sclerae anicteric. Pupils equal, round, reactive. No mouth sores or ulcers. Thyroid is normal size, not nodular, midline. Abdomen:  Soft, nontender, nondistended.  No palpable masses.  No hepatosplenomegaly.  No ascites. Normal bowel sounds.  No hernias.  Incisions are well healed Genitourinary: deferred Extremities: No cyanosis, clubbing or edema.  No calf tenderness or erythema. No palpable cords. Psychiatric: Mood and affect are appropriate. Neurological: Awake, alert and oriented x 3. Sensation is intact, no neuropathy.  Musculoskeletal: No pain, normal strength and range of motion.  Assessment:    62 y.o. year old with BRCA 1 germline mutation.   S/p robotic BSO on 11/24/17. The patient declined hysterectomy..   Plan:  Pathology reports reviewed today She was given the opportunity to ask questions, which were answered to her satisfaction, and she is agreement with the above mentioned plan of care. She has healed well from surgery. Return to clinic on a prn basis.  Donaciano Eva, MD

## 2018-01-07 ENCOUNTER — Ambulatory Visit: Admitting: Neurological Surgery

## 2018-01-07 ENCOUNTER — Ambulatory Visit: Admit: 2018-01-07 | Payer: Medicare Other

## 2018-01-07 NOTE — Progress Notes (Signed)
* * *        **[name]    --- ---    21 Y old Female, DOB: July 17, 1956, External MRN: [id number]    Account Number: [id number]    [address], [place], [id number]    Home: [phone]    Insurance: MEDICARE    PCP: Stacey Drain, MD Referring: Stacey Drain, MD    Appointment Facility: Neurosurgery        * * *    01/07/2018  Progress Notes: Glendell Docker, MD **CHN#:** [id number]    --- ---    ---         **History of Present Illness**    ---     _Associated Providers_ :    Primary Care Provider Danru Norma Fredrickson, MD.    _NEUROSURGERY_ :    62 yo F s/p L4-5 TLIF in 2016 who sustained a mechanical fall on 3/9. She was  getting out her truck when her foot caught on the mat. She fell to the ground  with outstretched arms and fell to her knees. She denies head strike. Since  the event she complains of mild back pain without radicular symptoms and  presents today to rule out a problem with her instrumentation from her prior  fusion. Xrays today rule out hardware failure, change in graft position, or  pseudoarthrosis.       **Current Medications**    ---    Taking     * Advil     ---    * Mupirocin Ointment 1 application in nostril as needed for MRSA flareup Externally as directed    ---    * Nystatin 100000 UNIT/GM Ointment 1 application to affected area as needed Externally     ---    * Omeprazole 40 MG Capsule Delayed Release 1 capsule. two days on, one day off Orally as directed    ---    * Seroquel 100 MG Tablet 1 tablet Orally Once a day    ---    * Tums 500 MG Tablet Chewable 1 tablet as needed for GI upset Orally Four times a day    ---    * Vitamin D3 2000 UNIT Tablet 1 tablet Orally Once a day    ---    * Zoloft 100 MG Tablet 1.5 tablet in morning Orally Once a day    ---    Not-Taking/PRN    * Citalopram Hydrobromide 20 MG Tablet 1 tablet Orally Once a day    ---    * Iron 325 (65 Fe) MG Tablet 1 tablet Orally Once a day    ---    * Tylenol 325 MG Tablet 2 tablets as  needed Orally every 6 hrs    ---       **Past Medical History**    ---       Kidney Stones .        ---    Chronic Depression .        ---    Anxiety.        ---    Post Tramatic Stress Disorder .        ---    MRSA left nostril (flares up occasionally).        ---       **Surgical History**    ---       Appendectomy    ---    Percutaneous diskectomy L4-5    ---  L4-5 laminectomy and TLIF with bilateral L4-5 pedicle screw fixation 10/02/2015    ---       **Family History**    ---       Mother: deceased    ---    Father: deceased    ---    5 sister(s) .    ---      **Social History**    ---    Tobacco  history: Never smoked.    Work/Occupation: unemployed, Sport and exercise psychologist.      **Allergies**    ---       sulfa    ---    penicillin    ---    latex    ---    dark dye    ---      **Hospitalization/Major Diagnostic Procedure**    ---       see surgeries    ---      **Vital Signs**    ---    Pain scale 3, Ht-in 5'1", Wt-lbs 134, BMI 25.32, BP 124/71, HR 64.       **Physical Examination**    ---    5/5 in bilateral IP, quads, AT, EHL, and gastrocs    Sensation intact and symmetric to light touch throughout the bilateral lower  extremities    Reflexes 1+ in the bilateral patellar and achilles tendons.       **Assessments**    ---    1\. Low back pain at multiple sites - M54.5 (Primary)    ---      62 yo F s/p L4-5 TLIF in 2016 who sustained a mechanical fall on 3/9 and has  experienced mild back pain as a result. Xrays rule out any problem with her  fusion or hardware. She most likely is experiencing muscular strain, which  showuld improve with time. She was counseled and provided with suggestions for  conservative management. She may follow-up PRN.    ---       **Treatment**    ---       **1\. Others**    Notes: Conservative management for muscular back pain    Follow-up PRN.    ---      **Follow Up**    ---    prn    Electronically signed by Glendell Docker , MD on 01/08/2018 at 03:39 PM EDT    Sign off status: Completed        * *  *        Neurosurgery    [address] [place]    [place], [place] [address]    Tel: [phone]    Fax: [phone]              * * *          Patient: [name], [name] DOB: Nov 16, 1955 Progress Note: Glendell Docker, MD  01/07/2018    ---    Note generated by eClinicalWorks EMR/PM Software ([email or web address])

## 2018-01-07 NOTE — Progress Notes (Signed)
.  Progress Notes  .  Patient: [name]  Provider: Glendell Docker    .  DOB:17-Jul-1956 Age: 62 Y Sex: Female  .  PCP: Stacey Drain MD  Date: 01/07/2018  .  --------------------------------------------------------------------------------  .  HISTORY OF PRESENT ILLNESS  .  Associated Providers:  Primary Care Provider  Danru Norma Fredrickson, MD.  .  NEUROSURGERY:  62 yo F s/p L4-5 TLIF in 2016 who  sustained a mechanical fall on 3/9. She was getting out her truck  when her foot caught on the mat. She fell to the ground with  outstretched arms and fell to her knees. She denies head strike.  Since the event she complains of mild back pain without radicular  symptoms and presents today to rule out a problem with her  instrumentation from her prior fusion. Xrays today rule out  hardware failure, change in graft position, or pseudoarthrosis.  Marland Kitchen  CURRENT MEDICATIONS  .  Taking Advil  Taking Mupirocin Ointment 1 application in nostril as needed for  MRSA flareup Externally as directed  Taking Nystatin 100000 UNIT/GM Ointment 1 application to affected  area as needed Externally  Taking Omeprazole 40 MG Capsule Delayed Release 1 capsule. two  days on, one day off Orally as directed  Taking Seroquel 100 MG Tablet 1 tablet Orally Once a day  Taking Tums 500 MG Tablet Chewable 1 tablet as needed for GI  upset Orally Four times a day  Taking Vitamin D3 2000 UNIT Tablet 1 tablet Orally Once a day  Taking Zoloft 100 MG Tablet 1.5 tablet in morning Orally Once a  day  Not-Taking/PRN Citalopram Hydrobromide 20 MG Tablet 1 tablet  Orally Once a day  Not-Taking/PRN Iron 325 (65 Fe) MG Tablet 1 tablet Orally Once a  day  Not-Taking/PRN Tylenol 325 MG Tablet 2 tablets as needed Orally  every 6 hrs  .  PAST MEDICAL HISTORY  .  Kidney Stones  Chronic Depression  Anxiety  Post Tramatic Stress Disorder  MRSA left nostril (flares up occasionally)  .  ALLERGIES  .  sulfa  penicillin  latex  dark dye  .  SURGICAL  HISTORY  .  Appendectomy  Percutaneous diskectomy L4-5  L4-5 laminectomy and TLIF with bilateral L4-5 pedicle screw  fixation 10/02/2015  .  FAMILY HISTORY  .  Mother: deceased  Father: deceased  5 sister(s) .  Marland Kitchen  SOCIAL HISTORY  .  .  Tobaccohistory:Never smoked.  .  Work/Occupation: unemployed, SSDI.  Marland Kitchen  HOSPITALIZATION/MAJOR DIAGNOSTIC PROCEDURE  .  see surgeries  .  VITAL SIGNS  .  Pain scale 3, Ht-in 5'1", Wt-lbs 134, BMI 25.32, BP 124/71, HR  64.  Marland Kitchen  PHYSICAL EXAMINATION  .  5/5 in bilateral IP, quads, AT, EHL, and gastrocsSensation intact  and symmetric to light touch throughout the bilateral lower  extremitiesReflexes 1+ in the bilateral patellar and achilles  tendons.  .  ASSESSMENTS  .  Low back pain at multiple sites - M54.5 (Primary)  .  62 yo F s/p L4-5 TLIF in 2016 who sustained a mechanical fall on  3/9 and has experienced mild back pain as a result. Xrays rule  out any problem with her fusion or hardware. She most likely is  experiencing muscular strain, which showuld improve with time.  She was counseled and provided with suggestions for conservative  management. She may follow-up PRN.  .  TREATMENT  .  Others  Notes: Conservative management for muscular back painFollow-up  PRN.  .  FOLLOW UP  .  prn  .  Electronically signed by Glendell Docker , MD on  01/08/2018 at 03:39 PM EDT  .  Document electronically signed by Glendell Docker    .

## 2018-01-10 ENCOUNTER — Other Ambulatory Visit: Payer: Self-pay | Admitting: Internal Medicine

## 2018-01-19 ENCOUNTER — Other Ambulatory Visit: Payer: Self-pay | Admitting: Internal Medicine

## 2018-01-19 DIAGNOSIS — Z1231 Encounter for screening mammogram for malignant neoplasm of breast: Secondary | ICD-10-CM

## 2018-02-10 ENCOUNTER — Encounter: Payer: Self-pay | Admitting: Internal Medicine

## 2018-02-11 ENCOUNTER — Other Ambulatory Visit: Payer: Self-pay | Admitting: Internal Medicine

## 2018-02-11 MED ORDER — METOPROLOL TARTRATE 25 MG PO TABS: 25.0000 mg | ORAL_TABLET | Freq: Two times a day (BID) | ORAL | 3 refills | Status: DC

## 2018-02-17 ENCOUNTER — Other Ambulatory Visit: Payer: Self-pay | Admitting: Internal Medicine

## 2018-02-23 ENCOUNTER — Other Ambulatory Visit: Payer: Self-pay | Admitting: Internal Medicine

## 2018-03-09 ENCOUNTER — Ambulatory Visit
Admission: RE | Admit: 2018-03-09 | Discharge: 2018-03-09 | Disposition: A | Discharge status: Home / Self Care | Payer: BLUE CROSS/BLUE SHIELD | Source: Ambulatory Visit | Attending: Internal Medicine | Admitting: Internal Medicine

## 2018-03-09 DIAGNOSIS — Z1231 Encounter for screening mammogram for malignant neoplasm of breast: Secondary | ICD-10-CM

## 2018-04-12 ENCOUNTER — Encounter: Payer: Self-pay | Admitting: Internal Medicine

## 2018-04-13 MED ORDER — LOSARTAN POTASSIUM 100 MG PO TABS: 100.0000 mg | ORAL_TABLET | Freq: Every day | ORAL | 0 refills | Status: DC

## 2018-04-14 ENCOUNTER — Encounter: Payer: Self-pay | Admitting: Internal Medicine

## 2018-04-14 ENCOUNTER — Ambulatory Visit (INDEPENDENT_AMBULATORY_CARE_PROVIDER_SITE_OTHER): Payer: BLUE CROSS/BLUE SHIELD | Admitting: Internal Medicine

## 2018-04-14 VITALS — BP 126/68 | HR 74 | Temp 98.4°F | Resp 16 | Ht 68.0 in | Wt 196.2 lb

## 2018-04-14 DIAGNOSIS — F329 Major depressive disorder, single episode, unspecified: Secondary | ICD-10-CM

## 2018-04-14 DIAGNOSIS — E559 Vitamin D deficiency, unspecified: Secondary | ICD-10-CM

## 2018-04-14 DIAGNOSIS — F32A Depression, unspecified: Secondary | ICD-10-CM

## 2018-04-14 DIAGNOSIS — Z1509 Genetic susceptibility to other malignant neoplasm: Secondary | ICD-10-CM

## 2018-04-14 DIAGNOSIS — G4733 Obstructive sleep apnea (adult) (pediatric): Secondary | ICD-10-CM

## 2018-04-14 DIAGNOSIS — F419 Anxiety disorder, unspecified: Secondary | ICD-10-CM | POA: Diagnosis not present

## 2018-04-14 DIAGNOSIS — Z1589 Genetic susceptibility to other disease: Secondary | ICD-10-CM

## 2018-04-14 DIAGNOSIS — M1991 Primary osteoarthritis, unspecified site: Secondary | ICD-10-CM

## 2018-04-14 DIAGNOSIS — Z1501 Genetic susceptibility to malignant neoplasm of breast: Secondary | ICD-10-CM | POA: Diagnosis not present

## 2018-04-14 DIAGNOSIS — I10 Essential (primary) hypertension: Secondary | ICD-10-CM | POA: Diagnosis not present

## 2018-04-14 LAB — BASIC METABOLIC PANEL
BUN: 13 mg/dL (ref 6–23)
CHLORIDE: 106 meq/L (ref 96–112)
CO2: 31 mEq/L (ref 19–32)
Calcium: 9.3 mg/dL (ref 8.4–10.5)
Creatinine, Ser: 0.75 mg/dL (ref 0.40–1.20)
GFR: 83.42 mL/min (ref >60.00)
Glucose, Bld: 99 mg/dL (ref 70–99)
POTASSIUM: 4.4 meq/L (ref 3.5–5.1)
SODIUM: 142 meq/L (ref 135–145)

## 2018-04-14 LAB — CBC WITH DIFFERENTIAL/PLATELET
Basophils Absolute: 0 10*3/uL (ref 0.0–0.1)
Basophils Relative: 0.9 % (ref 0.0–3.0)
EOS PCT: 2.4 % (ref 0.0–5.0)
Eosinophils Absolute: 0.1 10*3/uL (ref 0.0–0.7)
HEMATOCRIT: 40.1 % (ref 36.0–46.0)
HEMOGLOBIN: 14 g/dL (ref 12.0–15.0)
Lymphocytes Relative: 31.2 % (ref 12.0–46.0)
Lymphs Abs: 1.6 10*3/uL (ref 0.7–4.0)
MCHC: 34.8 g/dL (ref 30.0–36.0)
MCV: 87.6 fl (ref 78.0–100.0)
MONO ABS: 0.3 10*3/uL (ref 0.1–1.0)
MONOS PCT: 6.7 % (ref 3.0–12.0)
Neutro Abs: 3 10*3/uL (ref 1.4–7.7)
Neutrophils Relative %: 58.8 % (ref 43.0–77.0)
Platelets: 241 10*3/uL (ref 150.0–400.0)
RBC: 4.58 Mil/uL (ref 3.87–5.11)
RDW: 13.6 % (ref 11.5–15.5)
WBC: 5.1 10*3/uL (ref 4.0–10.5)

## 2018-04-14 NOTE — Progress Notes (Signed)
Subjective:    Patient ID: Sheila Fuller, female    DOB: 10/24/1956, 62 y.o.   MRN: [id number]  DOS:  04/14/2018 Type of visit - description : rov Interval history: HTN: Since the last visit medications were changed, ambulatory BPs have been in the 150s Anxiety depression: Much increase his stress at work, symptoms not well controlled, very anxious.  Also anxious about her own health and her family's health. + BRCA: s/p bilateral oophorectomy and salpingectomy , his brother was diagnosed with cancer  DJD: On daily naproxen, symptoms relatively well controlled  Wt Readings from Last 3 Encounters:  04/14/18 196 lb 4 oz (89 kg)  12/21/17 191 lb 1.6 oz (86.7 kg)  11/24/17 194 lb (88 kg)    Review of Systems Denies nausea, vomiting, stomach pain or blood in the stools No lower extremity edema Self DC vitamin D due to loose bowels, symptoms resolved, now taking a supplement called  [employer] No suicidal ideas  Past Medical History:  Diagnosis Date  . Allergic rhinitis   . Allergy   . Anxiety   . Arthritis   . Asthma    seasonally  . BRCA1 positive   . Cancer ([place])    skin  . 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   . Cataract    will remove left end of 10-2016  . Class 1 obesity   . Depression   . Family history of BRCA gene mutation    daughter BRCA1 +  . Family history of breast cancer   . Family history of pancreatic cancer   . GERD (gastroesophageal reflux disease)    takes zantac maybe 2/week  . Hyperlipidemia   . Hypertension   . Lactose intolerance 10/04/2013  . Microhematuria 01/2006   saw Dr.Peterson, renal u/s: r pyelocaliectasis but CT was neg, cysto neg  . Shortness of breath    only with asthma flare up  . Sleep apnea    rarely uses cpap    Past Surgical History:  Procedure Laterality Date  . ANTERIOR CERVICAL DECOMP/DISCECTOMY FUSION N/A 08/23/2014   Procedure: C5-6 Anterior Cervical Discectomy and  Fusion, Allograft, Plate;  Surgeon: Marybelle Killings, MD;  Location: [facility];  Service: Orthopedics;  Laterality: N/A;  . cervical cauterization    . CESAREAN SECTION    . COLONOSCOPY    . GANGLION CYST EXCISION    . NASAL SEPTUM SURGERY    . POLYPECTOMY    . ROBOTIC ASSISTED BILATERAL SALPINGO OOPHERECTOMY Bilateral 11/24/2017   Procedure: XI ROBOTIC ASSISTED BILATERAL SALPINGO OOPHORECTOMY;  Surgeon: Everitt Amber, MD;  Location: [facility];  Service: Gynecology;  Laterality: Bilateral;    Social History   Socioeconomic History  . Marital status: Married    Spouse name: Not on file  . Number of children: 1  . Years of education: Not on file  . Highest education level: Not on file  Occupational History  . 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 Financial resource strain: Not on file  . Food insecurity:    Worry: Not on file    Inability: Not on file  . Transportation needs:    Medical: Not on file    Non-medical: Not on file  Tobacco Use  . Smoking status: Former Smoker    Packs/day: 0.30    Years: 3.00    Pack years: 0.90  Types: Cigarettes  . Smokeless tobacco: Never Used  . Tobacco comment: quit 04-2013 (Chantix)  Substance and Sexual Activity  . Alcohol use: Yes    Alcohol/week: 1.8 oz    Types: 3 Standard drinks or equivalent per week    Comment: a week   . Drug use: No  . Sexual activity: Yes    Birth control/protection: Post-menopausal  Lifestyle  . Physical activity:    Days per week: Not on file    Minutes per session: Not on file  . Stress: Not on file  Relationships  . Social connections:    Talks on phone: Not on file    Gets together: Not on file    Attends religious service: Not on file    Active member of club or organization: Not on file    Attends meetings of clubs or organizations: Not on file    Relationship status: Not on file  . Intimate partner violence:    Fear of current or ex partner: Not on file     Emotionally abused: Not on file    Physically abused: Not on file    Forced sexual activity: Not on file  Other Topics Concern  . Not on file  Social History Narrative   remarried 10-11, 1 child      Allergies as of 04/14/2018      Reactions   Amlodipine Swelling, Other (See Comments)   Edema/swelling in legs.   Statins    REACTION: myalgia   Sulfonamide Derivatives Rash      Medication List        Accurate as of 04/14/18 11:59 PM. Always use your most recent med list.          albuterol 108 (90 Base) MCG/ACT inhaler Commonly known as:  PROVENTIL HFA;VENTOLIN HFA Inhale 2 puffs into the lungs every 4 (four) hours as needed for wheezing or shortness of breath.   ALLERGY EYE OP Place 1-2 drops into both eyes 3 (three) times daily as needed (for allergy eyes.).   azelastine 0.1 % nasal spray Commonly known as:  ASTELIN Place 2 sprays into both nostrils at bedtime. Use in each nostril as directed   cyclobenzaprine 5 MG tablet Commonly known as:  FLEXERIL Take 1 tablet (5 mg total) at bedtime as needed by mouth for muscle spasms.   desloratadine 5 MG tablet Commonly known as:  CLARINEX Take 5 mg by mouth daily as needed (for allergies.).   EMERGEN-C IMMUNE PLUS PO Take by mouth.   FLUoxetine 20 MG capsule Commonly known as:  PROZAC Take 1 capsule (20 mg total) by mouth daily.   losartan 100 MG tablet Commonly known as:  COZAAR Take 1 tablet (100 mg total) by mouth daily.   LUBRICANT EYE DROPS 0.4-0.3 % Soln Generic drug:  Polyethyl Glycol-Propyl Glycol Place 1-2 drops into both eyes 3 (three) times daily as needed (for dry/irritated eyes.).   metoprolol tartrate 25 MG tablet Commonly known as:  LOPRESSOR Take 1 tablet (25 mg total) by mouth 2 (two) times daily.   naproxen 500 MG tablet Commonly known as:  NAPROSYN Take 1 tablet (500 mg total) by mouth daily as needed for moderate pain.   ranitidine 75 MG tablet Commonly known as:  ZANTAC Take 75 mg by  mouth daily as needed (for acid reflux/heartburn/indigestion.).          Objective:   Physical Exam BP 126/68 (BP Location: Left Arm, Patient Position: Sitting, Cuff Size: Small)   Pulse  74   Temp 98.4 F (36.9 C) (Oral)   Resp 16   Ht '5\' 8"'  (1.727 m)   Wt 196 lb 4 oz (89 kg)   SpO2 98%   BMI 29.84 kg/m  General:   Well developed, see BMI.  HEENT:  Normocephalic . Face symmetric, atraumatic Lungs:  CTA B Normal respiratory effort, no intercostal retractions, no accessory muscle use. Heart: RRR,  no murmur.  No pretibial edema bilaterally  Skin: Not pale. Not jaundice Neurologic:  alert & oriented X3.  Speech normal, gait appropriate for age and unassisted Psych--  Cognition and judgment appear intact.  Cooperative with normal attention span and concentration.  Behavior appropriate. Tearful and mildly depressed appearing during the visit.     Assessment & Plan:   Assessment HTN Hyperlipidemia Anxiety, depression GERD Seasonal asthma Allergies Obesity : BMI~ 28 plus HTN, hyperlipidemia, OSA OSA: on Cpap consistently as off 03/2018 MSK: DJD, occasionally uses pain medication H/o Microhematuria 2007, Dr. Terance Hart, renal US showed right pyelocaliectasis, CT was negative, cystoscopy negative + BRCA: personal h/o (dx 2018)  and FH BRCA (daughter, sister).  See office visit 10/14/2017 H/o Carcinoma in situ, perianal skin, XRT, chemotherapy 2000. Not further follow-ups with specialist  PLAN:  HTN: : Since the last visit, amlodipine and subsequently carvedilol were discontinued due to edema.  BPs at home in the 150s, BPs normal here.  Rec no change for now, continue losartan, metoprolol.  Check a BMP and CBC Anxiety depression: Increase lately, due to increasing stress at work and d/t her own health.  We talked about the role of counseling for anxiety treatemnt, exercise, possibly increase fluoxetine, she will call and let me know if interested. OSA: Since the last visit  he is using her CPAP consistently DJD: On naproxen daily, GI precautions discussed, checking a BMP. +BRCA: s/p bilateral prophylactic salpingo-oophorectomy, no hysterectomy Her brother was dx with esophageal cancer, had surgery, receiving chemotherapy. Sister had a suspicious breast MRI. GERD: Well-controlled on Zantac as needed Vitamin D deficiency: Self DC vitamin D supplements due to loose stools, checking levels, consider reintroduce vitamin D. RTC 3 months >> 25 min

## 2018-04-14 NOTE — Patient Instructions (Signed)
GO TO THE LAB : Get the blood work     GO TO THE FRONT DESK Schedule your next appointment for a  Check up in 3 months  Check the  blood pressure   Weekly  Be sure your blood pressure is between 110/65 and  135/85. If it is consistently higher or lower, let me know  Naproxen  as needed for pain.  Always take it with food because may cause gastritis and ulcers.  If you notice nausea, stomach pain, change in the color of stools --->  Stop the medicine and let us know  Relaxing techniques at night Exercise Counselor?

## 2018-04-14 NOTE — Progress Notes (Signed)
Pre visit review using our clinic review tool, if applicable. No additional management support is needed unless otherwise documented below in the visit note. 

## 2018-04-15 NOTE — Assessment & Plan Note (Signed)
HTN: : Since the last visit, amlodipine and subsequently carvedilol were discontinued due to edema.  BPs at home in the 150s, BPs normal here.  Rec no change for now, continue losartan, metoprolol.  Check a BMP and CBC Anxiety depression: Increase lately, due to increasing stress at work and d/t her own health.  We talked about the role of counseling for anxiety treatemnt, exercise, possibly increase fluoxetine, she will call and let me know if interested. OSA: Since the last visit he is using her CPAP consistently DJD: On naproxen daily, GI precautions discussed, checking a BMP. +BRCA: s/p bilateral prophylactic salpingo-oophorectomy, no hysterectomy Her brother was dx with esophageal cancer, had surgery, receiving chemotherapy. Sister had a suspicious breast MRI. GERD: Well-controlled on Zantac as needed Vitamin D deficiency: Self DC vitamin D supplements due to loose stools, checking levels, consider reintroduce vitamin D. RTC 3 months

## 2018-04-17 LAB — VITAMIN D 1,25 DIHYDROXY
VITAMIN D 1, 25 (OH) TOTAL: 56 pg/mL (ref 18–72)
VITAMIN D3 1, 25 (OH): 56 pg/mL
Vitamin D2 1, 25 (OH)2: <8 pg/mL

## 2018-04-28 ENCOUNTER — Encounter: Payer: Self-pay | Admitting: Internal Medicine

## 2018-04-28 MED ORDER — FLUOXETINE HCL 40 MG PO CAPS: 40.0000 mg | ORAL_CAPSULE | Freq: Every day | ORAL | 1 refills | Status: DC

## 2018-05-24 ENCOUNTER — Other Ambulatory Visit: Payer: Self-pay | Admitting: Internal Medicine

## 2018-05-31 ENCOUNTER — Telehealth: Payer: Self-pay | Admitting: *Deleted

## 2018-05-31 NOTE — Telephone Encounter (Signed)
Patient called and stated "I saw Dr.Rossi earlier this year because I tested positive for BRCA 1 and had my ovaries removed. Since then my sister has also tested positive for it and breast cancer. Who do I need to talk to there about having a double mastectomy for myself." Per Melissa APP I gave the patient information for [facility] (Dr. Donne Hazel.)

## 2018-05-31 NOTE — Telephone Encounter (Signed)
Patient called back and requested that we fax her records to [facility]. Records faxed

## 2018-06-08 ENCOUNTER — Other Ambulatory Visit: Payer: Self-pay | Admitting: Internal Medicine

## 2018-06-15 DIAGNOSIS — Z803 Family history of malignant neoplasm of breast: Secondary | ICD-10-CM | POA: Diagnosis not present

## 2018-06-15 DIAGNOSIS — Z1509 Genetic susceptibility to other malignant neoplasm: Secondary | ICD-10-CM | POA: Diagnosis not present

## 2018-06-15 DIAGNOSIS — Z1501 Genetic susceptibility to malignant neoplasm of breast: Secondary | ICD-10-CM | POA: Diagnosis not present

## 2018-06-16 DIAGNOSIS — Z803 Family history of malignant neoplasm of breast: Secondary | ICD-10-CM | POA: Diagnosis not present

## 2018-06-16 DIAGNOSIS — Z1509 Genetic susceptibility to other malignant neoplasm: Secondary | ICD-10-CM | POA: Diagnosis not present

## 2018-06-16 DIAGNOSIS — Z1501 Genetic susceptibility to malignant neoplasm of breast: Secondary | ICD-10-CM | POA: Diagnosis not present

## 2018-07-02 ENCOUNTER — Encounter: Payer: Self-pay | Admitting: Internal Medicine

## 2018-07-02 MED ORDER — METOPROLOL TARTRATE 25 MG PO TABS: 25.0000 mg | ORAL_TABLET | Freq: Two times a day (BID) | ORAL | 5 refills | Status: DC

## 2018-07-10 ENCOUNTER — Other Ambulatory Visit: Payer: Self-pay | Admitting: Internal Medicine

## 2018-07-13 DIAGNOSIS — Z09 Encounter for follow-up examination after completed treatment for conditions other than malignant neoplasm: Secondary | ICD-10-CM | POA: Diagnosis not present

## 2018-07-13 DIAGNOSIS — Z1501 Genetic susceptibility to malignant neoplasm of breast: Secondary | ICD-10-CM | POA: Diagnosis not present

## 2018-07-13 DIAGNOSIS — Z87898 Personal history of other specified conditions: Secondary | ICD-10-CM | POA: Diagnosis not present

## 2018-07-15 ENCOUNTER — Ambulatory Visit: Payer: BLUE CROSS/BLUE SHIELD | Admitting: Internal Medicine

## 2018-07-22 DIAGNOSIS — Z1231 Encounter for screening mammogram for malignant neoplasm of breast: Secondary | ICD-10-CM | POA: Diagnosis not present

## 2018-07-27 DIAGNOSIS — N6320 Unspecified lump in the left breast, unspecified quadrant: Secondary | ICD-10-CM | POA: Diagnosis not present

## 2018-07-27 DIAGNOSIS — Z1509 Genetic susceptibility to other malignant neoplasm: Secondary | ICD-10-CM | POA: Diagnosis not present

## 2018-07-27 DIAGNOSIS — Z1502 Genetic susceptibility to malignant neoplasm of ovary: Secondary | ICD-10-CM | POA: Diagnosis not present

## 2018-07-27 DIAGNOSIS — Z1501 Genetic susceptibility to malignant neoplasm of breast: Secondary | ICD-10-CM | POA: Diagnosis not present

## 2018-07-27 DIAGNOSIS — Z4001 Encounter for prophylactic removal of breast: Secondary | ICD-10-CM | POA: Diagnosis not present

## 2018-07-27 DIAGNOSIS — N6321 Unspecified lump in the left breast, upper outer quadrant: Secondary | ICD-10-CM | POA: Diagnosis not present

## 2018-07-28 ENCOUNTER — Ambulatory Visit
Admission: EM | Admit: 2018-07-28 | Discharge: 2018-07-28 | Disposition: A | Discharge status: Home / Self Care | Payer: BLUE CROSS/BLUE SHIELD | Attending: Family Medicine | Admitting: Family Medicine

## 2018-07-28 ENCOUNTER — Other Ambulatory Visit: Payer: Self-pay

## 2018-07-28 ENCOUNTER — Encounter: Payer: Self-pay | Admitting: Emergency Medicine

## 2018-07-28 DIAGNOSIS — N390 Urinary tract infection, site not specified: Secondary | ICD-10-CM | POA: Diagnosis not present

## 2018-07-28 LAB — URINALYSIS, COMPLETE (UACMP) WITH MICROSCOPIC
Bilirubin Urine: NEGATIVE
Glucose, UA: NEGATIVE mg/dL
Ketones, ur: NEGATIVE mg/dL
Nitrite: NEGATIVE
Protein, ur: 100 mg/dL — AB
RBC / HPF: >50 RBC/hpf (ref 0–5)
SPECIFIC GRAVITY, URINE: 1.02 (ref 1.005–1.030)
SQUAMOUS EPITHELIAL / LPF: NONE SEEN (ref 0–5)
WBC, UA: >50 WBC/hpf (ref 0–5)
pH: 6.5 (ref 5.0–8.0)

## 2018-07-28 MED ORDER — CEPHALEXIN 500 MG PO CAPS: 500.0000 mg | ORAL_CAPSULE | Freq: Two times a day (BID) | ORAL | 0 refills | Status: DC

## 2018-07-28 NOTE — ED Triage Notes (Signed)
Patient c/o cold chills, vomiting and low back pain that started last night. Patient also c/o urinary frequency.

## 2018-07-28 NOTE — ED Provider Notes (Addendum)
[facility]    CSN: [id number] Arrival date & time: 07/28/18  1254     History   Chief Complaint Chief Complaint  Patient presents with  . Urinary Frequency  . Back Pain    HPI Sheila Fuller is a 62 y.o. female.   62 yo female with a c/o chills, low back pain and urinary frequency. Denies dysuria, hematuria, vomiting.   The history is provided by the patient.  Urinary Frequency  This is a new problem.  Back Pain    Past Medical History:  Diagnosis Date  . Allergic rhinitis   . Allergy   . Anxiety   . Arthritis   . Asthma    seasonally  . BRCA1 positive   . Cancer ([place])    skin  . 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   . Cataract    will remove left end of 10-2016  . Class 1 obesity   . Depression   . Family history of BRCA gene mutation    daughter BRCA1 +  . Family history of breast cancer   . Family history of pancreatic cancer   . GERD (gastroesophageal reflux disease)    takes zantac maybe 2/week  . Hyperlipidemia   . Hypertension   . Lactose intolerance 10/04/2013  . Microhematuria 01/2006   saw Dr.Peterson, renal u/s: r pyelocaliectasis but CT was neg, cysto neg  . Shortness of breath    only with asthma flare up  . Sleep apnea    rarely uses cpap    Patient Active Problem List   Diagnosis Date Noted  . High risk of ovarian cancer 09/07/2017  . BRCA1 positive 08/19/2017  . Family history of BRCA gene mutation   . Family history of breast cancer   . Family history of pancreatic cancer   . Obesity 06/02/2016  . PCP NOTES >>>>>>>> 10/19/2015  . Deformity of metatarsal bone of left foot 08/21/2015  . Metatarsalgia of left foot 08/21/2015  . Pronation deformity of left foot 08/21/2015  . Essential hypertension 01/03/2015  . HNP (herniated nucleus pulposus), cervical 08/23/2014  . Lactose intolerance 10/04/2013  . Lipoma of back 08/05/2013  . Annual physical exam 11/28/2011  .  COPD (chronic obstructive pulmonary disease) ([place]) 11/28/2011  . Tobacco abuse 06/17/2011  . De Quervain's disease (tenosynovitis) 05/01/2011  . DJD (degenerative joint disease) 04/25/2011  . INSOMNIA-SLEEP DISORDER-UNSPEC 08/20/2009  . OSA (obstructive sleep apnea) 07/30/2008  . MOLE 04/21/2008  . NEOP, MALIGNANT, SKIN NOS 05/03/2007  . COLONIC POLYPS 05/03/2007  . HYPERLIPIDEMIA 05/03/2007  . Anxiety and depression 05/03/2007  . ALLERGIC RHINITIS 05/03/2007  . GERD 05/03/2007    Past Surgical History:  Procedure Laterality Date  . ANTERIOR CERVICAL DECOMP/DISCECTOMY FUSION N/A 08/23/2014   Procedure: C5-6 Anterior Cervical Discectomy and Fusion, Allograft, Plate;  Surgeon: Marybelle Killings, MD;  Location: [facility];  Service: Orthopedics;  Laterality: N/A;  . cervical cauterization    . CESAREAN SECTION    . COLONOSCOPY    . GANGLION CYST EXCISION    . NASAL SEPTUM SURGERY    . POLYPECTOMY    . ROBOTIC ASSISTED BILATERAL SALPINGO OOPHERECTOMY Bilateral 11/24/2017   Procedure: XI ROBOTIC ASSISTED BILATERAL SALPINGO OOPHORECTOMY;  Surgeon: Everitt Amber, MD;  Location: [facility];  Service: Gynecology;  Laterality: Bilateral;    OB History    Gravida  2   Para  1   Term  1   Preterm      AB  1   Living  1     SAB      TAB  1   Ectopic      Multiple      Live Births               Home Medications    Prior to Admission medications   Medication Sig Start Date End Date Taking? Authorizing Provider  albuterol (PROVENTIL HFA;VENTOLIN HFA) 108 (90 Base) MCG/ACT inhaler Inhale into the lungs. 07/01/17  Yes [provider]  azelastine (ASTELIN) 0.1 % nasal spray Place into the nose. 07/01/17  Yes [provider]  carvedilol (COREG) 6.25 MG tablet  05/03/18  Yes [provider]  cyclobenzaprine (FLEXERIL) 5 MG tablet  09/14/14  Yes [provider]  desloratadine (CLARINEX) 5 MG tablet Take by mouth. 04/07/16  Yes [provider]    FLUoxetine (PROZAC) 20 MG capsule Take by mouth. 07/14/17  Yes [provider]  losartan (COZAAR) 100 MG tablet Take by mouth. 04/17/15  Yes [provider]  metoprolol tartrate (LOPRESSOR) 25 MG tablet Take by mouth. 07/02/18  Yes [provider]  naproxen (NAPROSYN) 500 MG tablet  05/24/18  Yes [provider]  albuterol (PROAIR HFA) 108 (90 Base) MCG/ACT inhaler Inhale 2 puffs into the lungs every 4 (four) hours as needed for wheezing or shortness of breath. 06/08/18   Colon Branch, MD  azelastine (ASTELIN) 0.1 % nasal spray Place 2 sprays into both nostrils at bedtime. Use in each nostril as directed 07/01/17   Colon Branch, MD  cephALEXin (KEFLEX) 500 MG capsule Take 1 capsule (500 mg total) by mouth 2 (two) times daily. 07/28/18   Norval Gable, MD  cyclobenzaprine (FLEXERIL) 5 MG tablet Take 1 tablet (5 mg total) at bedtime as needed by mouth for muscle spasms. 09/14/17   Colon Branch, MD  desloratadine (CLARINEX) 5 MG tablet Take 5 mg by mouth daily as needed (for allergies.).  04/07/16   [provider]  FLUoxetine (PROZAC) 40 MG capsule Take 1 capsule (40 mg total) by mouth daily. 04/28/18   Colon Branch, MD  losartan (COZAAR) 100 MG tablet Take 1 tablet (100 mg total) by mouth daily. 07/12/18   Colon Branch, MD  metoprolol tartrate (LOPRESSOR) 25 MG tablet Take 1 tablet (25 mg total) by mouth 2 (two) times daily. 07/02/18   Colon Branch, MD  Multiple Vitamins-Minerals (EMERGEN-C IMMUNE PLUS PO) Take by mouth.    [provider]  Naphazoline-Pheniramine (ALLERGY EYE OP) Place 1-2 drops into both eyes 3 (three) times daily as needed (for allergy eyes.).    [provider]  naproxen (NAPROSYN) 500 MG tablet Take 1 tablet (500 mg total) by mouth daily as needed. 05/24/18   Colon Branch, MD  Polyethyl Glycol-Propyl Glycol (LUBRICANT EYE DROPS) 0.4-0.3 % SOLN Place 1-2 drops into both eyes 3 (three) times daily as needed (for dry/irritated eyes.).     [provider]  ranitidine (ZANTAC) 150 MG tablet Take by mouth.    [provider]  ranitidine (ZANTAC) 75 MG tablet Take 75 mg by mouth daily as needed (for acid reflux/heartburn/indigestion.).     [provider]    Family History Family History  Problem Relation Age of Onset  . Lung cancer Father 74       hx smoking  . Diabetes Mother  late onset  . Hyperlipidemia Mother   . Cancer Mother 63       metholosmia  . Cervical cancer Sister 87  . Cancer Maternal Aunt 60       colon/rectal  . Cancer Paternal Uncle        lung- hx smoking , died 37's  . BRCA 1/2 Daughter        had prophlactic bilat mastectomy  . Esophageal cancer Brother 45  . Breast cancer Paternal Aunt 65  . Breast cancer Paternal Aunt 27  . Lung cancer Paternal Uncle   . Pancreatic cancer Paternal Uncle 29  . Cancer Cousin        type unk, dx <50  . Coronary artery disease Neg Hx   . Hypertension Neg Hx   . Stroke Neg Hx   . Sudden death Neg Hx   . Heart attack Neg Hx   . Colon polyps Neg Hx   . Rectal cancer Neg Hx   . Stomach cancer Neg Hx     Social History Social History   Tobacco Use  . Smoking status: Former Smoker    Packs/day: 0.30    Years: 3.00    Pack years: 0.90    Types: Cigarettes  . Smokeless tobacco: Never Used  . Tobacco comment: quit 04-2013 (Chantix)  Substance Use Topics  . Alcohol use: Yes    Alcohol/week: 3.0 standard drinks    Types: 3 Standard drinks or equivalent per week    Comment: a week   . Drug use: No     Allergies   Amlodipine; Statins; and Sulfonamide derivatives   Review of Systems Review of Systems  Genitourinary: Positive for frequency.  Musculoskeletal: Positive for back pain.     Physical Exam Triage Vital Signs ED Triage Vitals  Enc Vitals Group     BP 07/28/18 1309 (!) 151/87     Pulse Rate 07/28/18 1309 77     Resp 07/28/18 1309 18     Temp 07/28/18 1309 98.4 F (36.9 C)     Temp Source 07/28/18  1309 Oral     SpO2 07/28/18 1309 97 %     Weight 07/28/18 1305 189 lb (85.7 kg)     Height 07/28/18 1305 _0  (1.727 m)     Head Circumference --      Peak Flow --      Pain Score 07/28/18 1305 4     Pain Loc --      Pain Edu? --      Excl. in [place]? --    No data found.  Updated Vital Signs BP (!) 151/87 (BP Location: Right Arm)   Pulse 77   Temp 98.4 F (36.9 C) (Oral)   Resp 18   Ht _1  (1.727 m)   Wt 85.7 kg   SpO2 97%   BMI 28.74 kg/m   Visual Acuity Right Eye Distance:   Left Eye Distance:   Bilateral Distance:    Right Eye Near:   Left Eye Near:    Bilateral Near:     Physical Exam  Constitutional: She appears well-developed and well-nourished. No distress.  Abdominal: Soft. Bowel sounds are normal. She exhibits no distension and no mass. There is no tenderness. There is no rebound and no guarding. No hernia.  Skin: She is not diaphoretic.  Nursing note and vitals reviewed.    UC Treatments / Results  Labs (all labs ordered are listed, but only abnormal results are displayed)  Labs Reviewed  URINALYSIS, COMPLETE (UACMP) WITH MICROSCOPIC - Abnormal; Notable for the following components:      Result Value   APPearance CLOUDY (*)    Hgb urine dipstick LARGE (*)    Protein, ur 100 (*)    Leukocytes, UA LARGE (*)    Bacteria, UA MANY (*)    All other components within normal limits    EKG None  Radiology No results found.  Procedures Procedures (including critical care time)  Medications Ordered in UC Medications - No data to display  Initial Impression / Assessment and Plan / UC Course  I have reviewed the triage vital signs and the nursing notes.  Pertinent labs & imaging results that were available during my care of the patient were reviewed by me and considered in my medical decision making (see chart for details).      Final Clinical Impressions(s) / UC Diagnoses   Final diagnoses:  Lower urinary tract infectious disease    ED  Prescriptions    Medication Sig Dispense Auth. Provider   cephALEXin (KEFLEX) 500 MG capsule Take 1 capsule (500 mg total) by mouth 2 (two) times daily. 14 capsule Stoney Karczewski, Linward Foster, MD      1. diagnosis reviewed with patient 2. rx as per orders above; reviewed possible side effects, interactions, risks and benefits  3. Recommend supportive treatment with increased fluids  4. Follow-up prn if symptoms worsen or don't improve   Controlled Substance Prescriptions [place] Controlled Substance Registry consulted? Not Applicable   Norval Gable, MD 07/28/18 [place], MD 07/28/18 1400

## 2018-08-02 DIAGNOSIS — Z1501 Genetic susceptibility to malignant neoplasm of breast: Secondary | ICD-10-CM | POA: Diagnosis not present

## 2018-08-02 DIAGNOSIS — Z1509 Genetic susceptibility to other malignant neoplasm: Secondary | ICD-10-CM | POA: Diagnosis not present

## 2018-08-02 DIAGNOSIS — Z1502 Genetic susceptibility to malignant neoplasm of ovary: Secondary | ICD-10-CM | POA: Diagnosis not present

## 2018-08-02 DIAGNOSIS — Z1239 Encounter for other screening for malignant neoplasm of breast: Secondary | ICD-10-CM | POA: Diagnosis not present

## 2018-08-12 ENCOUNTER — Encounter: Payer: Self-pay | Admitting: Internal Medicine

## 2018-08-12 ENCOUNTER — Ambulatory Visit (INDEPENDENT_AMBULATORY_CARE_PROVIDER_SITE_OTHER): Payer: BLUE CROSS/BLUE SHIELD | Admitting: Internal Medicine

## 2018-08-12 VITALS — BP 132/80 | HR 56 | Temp 98.0°F | Resp 16 | Ht 68.0 in | Wt 190.5 lb

## 2018-08-12 DIAGNOSIS — Z1501 Genetic susceptibility to malignant neoplasm of breast: Secondary | ICD-10-CM

## 2018-08-12 DIAGNOSIS — F329 Major depressive disorder, single episode, unspecified: Secondary | ICD-10-CM | POA: Diagnosis not present

## 2018-08-12 DIAGNOSIS — I10 Essential (primary) hypertension: Secondary | ICD-10-CM

## 2018-08-12 DIAGNOSIS — Z1509 Genetic susceptibility to other malignant neoplasm: Secondary | ICD-10-CM

## 2018-08-12 DIAGNOSIS — F419 Anxiety disorder, unspecified: Secondary | ICD-10-CM | POA: Diagnosis not present

## 2018-08-12 DIAGNOSIS — F32A Depression, unspecified: Secondary | ICD-10-CM

## 2018-08-12 NOTE — Progress Notes (Signed)
Pre visit review using our clinic review tool, if applicable. No additional management support is needed unless otherwise documented below in the visit note. 

## 2018-08-12 NOTE — Patient Instructions (Signed)
  GO TO THE FRONT DESK Schedule your next appointment for a  Physical in 3 months

## 2018-08-12 NOTE — Progress Notes (Signed)
Subjective:    Patient ID: Sheila Fuller, female    DOB: 1955/11/24, 62 y.o.   MRN: [id number]  DOS:  08/12/2018 Type of visit - description : f/u Interval history: HTN: Lately, her readings have been much improved. BRCA +: To have a bilateral mastectomy Anxiety: Doing better, she is eating healthier, starting to do some yoga, lost some weight.   Review of Systems Recently was dx w/ a UTI, symptoms were low back pain when urinary frequency, had antibiotic, symptoms resolved.  Past Medical History:  Diagnosis Date  . Allergic rhinitis   . Allergy   . Anxiety   . Arthritis   . Asthma    seasonally  . BRCA1 positive   . Cancer ([place])    skin  . 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   . Cataract    will remove left end of 10-2016  . Class 1 obesity   . Depression   . Family history of BRCA gene mutation    daughter BRCA1 +  . Family history of breast cancer   . Family history of pancreatic cancer   . GERD (gastroesophageal reflux disease)    takes zantac maybe 2/week  . Hyperlipidemia   . Hypertension   . Lactose intolerance 10/04/2013  . Microhematuria 01/2006   saw Dr.Peterson, renal u/s: r pyelocaliectasis but CT was neg, cysto neg  . Shortness of breath    only with asthma flare up  . Sleep apnea    rarely uses cpap    Past Surgical History:  Procedure Laterality Date  . ANTERIOR CERVICAL DECOMP/DISCECTOMY FUSION N/A 08/23/2014   Procedure: C5-6 Anterior Cervical Discectomy and Fusion, Allograft, Plate;  Surgeon: Marybelle Killings, MD;  Location: [place];  Service: Orthopedics;  Laterality: N/A;  . cervical cauterization    . CESAREAN SECTION    . COLONOSCOPY    . GANGLION CYST EXCISION    . NASAL SEPTUM SURGERY    . POLYPECTOMY    . ROBOTIC ASSISTED BILATERAL SALPINGO OOPHERECTOMY Bilateral 11/24/2017   Procedure: XI ROBOTIC ASSISTED BILATERAL SALPINGO OOPHORECTOMY;  Surgeon: Everitt Amber, MD;  Location: [facility];   Service: Gynecology;  Laterality: Bilateral;    Social History   Socioeconomic History  . Marital status: Married    Spouse name: Not on file  . Number of children: 1  . Years of education: Not on file  . Highest education level: Not on file  Occupational History  . 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 Financial resource strain: Not on file  . Food insecurity:    Worry: Not on file    Inability: Not on file  . Transportation needs:    Medical: Not on file    Non-medical: Not on file  Tobacco Use  . Smoking status: Former Smoker    Packs/day: 0.30    Years: 3.00    Pack years: 0.90    Types: Cigarettes  . Smokeless tobacco: Never Used  . Tobacco comment: quit 04-2013 (Chantix)  Substance and Sexual Activity  . Alcohol use: Yes    Alcohol/week: 3.0 standard drinks    Types: 3 Standard drinks or equivalent per week    Comment: a week   . Drug use: No  . Sexual activity: Yes    Birth control/protection: Post-menopausal  Lifestyle  . Physical activity:    Days per week:  Not on file    Minutes per session: Not on file  . Stress: Not on file  Relationships  . Social connections:    Talks on phone: Not on file    Gets together: Not on file    Attends religious service: Not on file    Active member of club or organization: Not on file    Attends meetings of clubs or organizations: Not on file    Relationship status: Not on file  . Intimate partner violence:    Fear of current or ex partner: Not on file    Emotionally abused: Not on file    Physically abused: Not on file    Forced sexual activity: Not on file  Other Topics Concern  . Not on file  Social History Narrative   remarried 10-11, 1 child      Allergies as of 08/12/2018      Reactions   Amlodipine Swelling, Other (See Comments)   Edema/swelling in legs.   Statins    REACTION: myalgia   Sulfonamide Derivatives Rash      Medication List        Accurate as  of 08/12/18 11:59 PM. Always use your most recent med list.          albuterol 108 (90 Base) MCG/ACT inhaler Commonly known as:  PROVENTIL HFA;VENTOLIN HFA Inhale 2 puffs into the lungs every 4 (four) hours as needed for wheezing or shortness of breath.   ALLERGY EYE OP Place 1-2 drops into both eyes 3 (three) times daily as needed (for allergy eyes.).   azelastine 0.1 % nasal spray Commonly known as:  ASTELIN Place 2 sprays into both nostrils at bedtime. Use in each nostril as directed   cyclobenzaprine 5 MG tablet Commonly known as:  FLEXERIL Take 1 tablet (5 mg total) at bedtime as needed by mouth for muscle spasms.   desloratadine 5 MG tablet Commonly known as:  CLARINEX Take 5 mg by mouth daily as needed (for allergies.).   EMERGEN-C IMMUNE PLUS PO Take by mouth.   FLUoxetine 40 MG capsule Commonly known as:  PROZAC Take 1 capsule (40 mg total) by mouth daily.   losartan 100 MG tablet Commonly known as:  COZAAR Take 1 tablet (100 mg total) by mouth daily.   LUBRICANT EYE DROPS 0.4-0.3 % Soln Generic drug:  Polyethyl Glycol-Propyl Glycol Place 1-2 drops into both eyes 3 (three) times daily as needed (for dry/irritated eyes.).   metoprolol tartrate 25 MG tablet Commonly known as:  LOPRESSOR Take 1 tablet (25 mg total) by mouth 2 (two) times daily.   naproxen 500 MG tablet Commonly known as:  NAPROSYN Take 1 tablet (500 mg total) by mouth daily as needed.   NEXIUM 24HR CLEAR MINIS 20 MG capsule Generic drug:  esomeprazole Take 20 mg by mouth daily at 12 noon.   ranitidine 75 MG tablet Commonly known as:  ZANTAC Take 75 mg by mouth daily as needed (for acid reflux/heartburn/indigestion.).   ranitidine 150 MG tablet Commonly known as:  ZANTAC Take by mouth.          Objective:   Physical Exam BP 132/80 (BP Location: Left Arm, Patient Position: Sitting, Cuff Size: Small)   Pulse (!) 56   Temp 98 F (36.7 C) (Oral)   Resp 16   Ht '5\' 8"'  (1.727 m)    Wt 190 lb 8 oz (86.4 kg)   SpO2 98%   BMI 28.97 kg/m  General:   Well  developed, NAD, see BMI.  HEENT:  Normocephalic . Face symmetric, atraumatic Lungs:  CTA B Normal respiratory effort, no intercostal retractions, no accessory muscle use. Heart: RRR,  no murmur.  No pretibial edema bilaterally  Skin: Not pale. Not jaundice Neurologic:  alert & oriented X3.  Speech normal, gait appropriate for age and unassisted Psych--  Cognition and judgment appear intact.  Cooperative with normal attention span and concentration.  Behavior appropriate. No anxious or depressed appearing.      Assessment & Plan:   Assessment HTN Hyperlipidemia Anxiety, depression GERD Seasonal asthma Allergies Obesity : BMI~ 28 plus HTN, hyperlipidemia, OSA OSA: on Cpap consistently as off 03/2018 MSK: DJD, occasionally uses pain medication H/o Microhematuria 2007, Dr. Terance Hart, renal US showed right pyelocaliectasis, CT was negative, cystoscopy negative + BRCA: personal h/o (dx 2018)  and FH BRCA (daughter, sister).  See office visit 10/14/2017 H/o Carcinoma in situ, perianal skin, XRT, chemotherapy 2000. Not further follow-ups with specialist  PLAN:  HTN: Currently on losartan, metoprolol, ambulatory BPs recently are very good.  She thinks she got better after was treated for a UTI.  No change, continue monitoring. Anxiety, depression: Overall feels better, less stress at work, started yoga.  Fluoxetine dose was increased few months ago.  Continue present care. + BRCA: To have bilateral mastectomy 10/07/2018, few weeks later we have breast reconstruction.  She feels relief by the upcoming surgery. Preventive care: Had a flu shot yesterday, will discuss lung cancer screening with a CT at some point next year. RTC 3 months CPX

## 2018-08-13 NOTE — Assessment & Plan Note (Signed)
  HTN: Currently on losartan, metoprolol, ambulatory BPs recently are very good.  She thinks she got better after was treated for a UTI.  No change, continue monitoring. Anxiety, depression: Overall feels better, less stress at work, started yoga.  Fluoxetine dose was increased few months ago.  Continue present care. + BRCA: To have bilateral mastectomy 10/07/2018, few weeks later we have breast reconstruction.  She feels relief by the upcoming surgery. Preventive care: Had a flu shot yesterday, will discuss lung cancer screening with a CT at some point next year. RTC 3 months CPX

## 2018-08-22 ENCOUNTER — Other Ambulatory Visit: Payer: Self-pay | Admitting: Internal Medicine

## 2018-08-25 ENCOUNTER — Encounter: Payer: Self-pay | Admitting: Internal Medicine

## 2018-08-25 MED ORDER — AZELASTINE HCL 0.1 % NA SOLN: 2.0000 | Freq: Every day | NASAL | 6 refills | Status: DC

## 2018-09-01 ENCOUNTER — Ambulatory Visit
Admission: EM | Admit: 2018-09-01 | Discharge: 2018-09-01 | Disposition: A | Discharge status: Home / Self Care | Payer: BLUE CROSS/BLUE SHIELD | Attending: Family Medicine | Admitting: Family Medicine

## 2018-09-01 ENCOUNTER — Other Ambulatory Visit: Payer: Self-pay

## 2018-09-01 ENCOUNTER — Encounter: Payer: Self-pay | Admitting: Emergency Medicine

## 2018-09-01 DIAGNOSIS — R319 Hematuria, unspecified: Secondary | ICD-10-CM | POA: Diagnosis not present

## 2018-09-01 DIAGNOSIS — N390 Urinary tract infection, site not specified: Secondary | ICD-10-CM | POA: Diagnosis not present

## 2018-09-01 LAB — URINALYSIS, COMPLETE (UACMP) WITH MICROSCOPIC
Bilirubin Urine: NEGATIVE
GLUCOSE, UA: NEGATIVE mg/dL
KETONES UR: NEGATIVE mg/dL
Nitrite: NEGATIVE
PH: 6 (ref 5.0–8.0)
PROTEIN: NEGATIVE mg/dL
Specific Gravity, Urine: 1.01 (ref 1.005–1.030)
WBC, UA: >50 WBC/hpf (ref 0–5)

## 2018-09-01 MED ORDER — NITROFURANTOIN MONOHYD MACRO 100 MG PO CAPS: 100.0000 mg | ORAL_CAPSULE | Freq: Two times a day (BID) | ORAL | 0 refills | Status: AC

## 2018-09-01 NOTE — ED Triage Notes (Signed)
Patient c/o burning when urinating that started 5 days ago.  Patient reports increase in urgency and frequency.

## 2018-09-01 NOTE — ED Provider Notes (Signed)
[facility] ____________________________________________  Time seen: Approximately 5:52 PM  I have reviewed the triage vital signs and the nursing notes.   HISTORY  Chief Complaint Dysuria   HPI Sheila Fuller is a 62 y.o. female presented for evaluation of urinary frequency, urinary urgency with some burning with urination present for the last 4 to 5 days.  States minimal low back aching back pain.  Denies any abdominal discomfort.  Denies vaginal discharge, vaginal odor or vaginal pain.  Reports she recently had similar with a urinary tract infection at the beginning of October and was treated with Keflex.  Patient reports that symptoms did fully resolve after taking medication and just returned.  Denies fever, vomiting, diarrhea or known trigger.  Denies recurrent UTIs.  Continues to eat and drink well.  Reports otherwise doing well denies other complaints.  Denies any recent antibiotic use.  Colon Branch, MD: PCP   Past Medical History:  Diagnosis Date  . Allergic rhinitis   . Allergy   . Anxiety   . Arthritis   . Asthma    seasonally  . BRCA1 positive   . Cancer ([place])    skin  . 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   . Cataract    will remove left end of 10-2016  . Class 1 obesity   . Depression   . Family history of BRCA gene mutation    daughter BRCA1 +  . Family history of breast cancer   . Family history of pancreatic cancer   . GERD (gastroesophageal reflux disease)    takes zantac maybe 2/week  . Hyperlipidemia   . Hypertension   . Lactose intolerance 10/04/2013  . Microhematuria 01/2006   saw Dr.Peterson, renal u/s: r pyelocaliectasis but CT was neg, cysto neg  . Shortness of breath    only with asthma flare up  . Sleep apnea    rarely uses cpap    Patient Active Problem List   Diagnosis Date Noted  . High risk of ovarian cancer 09/07/2017  . BRCA1 positive 08/19/2017  . Family history of  BRCA gene mutation   . Family history of breast cancer   . Family history of pancreatic cancer   . Obesity 06/02/2016  . PCP NOTES >>>>>>>> 10/19/2015  . Deformity of metatarsal bone of left foot 08/21/2015  . Metatarsalgia of left foot 08/21/2015  . Pronation deformity of left foot 08/21/2015  . Essential hypertension 01/03/2015  . HNP (herniated nucleus pulposus), cervical 08/23/2014  . Lactose intolerance 10/04/2013  . Lipoma of back 08/05/2013  . Annual physical exam 11/28/2011  . COPD (chronic obstructive pulmonary disease) ([place]) 11/28/2011  . Tobacco abuse 06/17/2011  . De Quervain's disease (tenosynovitis) 05/01/2011  . DJD (degenerative joint disease) 04/25/2011  . INSOMNIA-SLEEP DISORDER-UNSPEC 08/20/2009  . OSA (obstructive sleep apnea) 07/30/2008  . NEOP, MALIGNANT, SKIN NOS 05/03/2007  . COLONIC POLYPS 05/03/2007  . HYPERLIPIDEMIA 05/03/2007  . Anxiety and depression 05/03/2007  . ALLERGIC RHINITIS 05/03/2007  . GERD 05/03/2007    Past Surgical History:  Procedure Laterality Date  . ANTERIOR CERVICAL DECOMP/DISCECTOMY FUSION N/A 08/23/2014   Procedure: C5-6 Anterior Cervical Discectomy and Fusion, Allograft, Plate;  Surgeon: Marybelle Killings, MD;  Location: [place];  Service: Orthopedics;  Laterality: N/A;  . cervical cauterization    . CESAREAN SECTION    . COLONOSCOPY    . GANGLION CYST EXCISION    .  NASAL SEPTUM SURGERY    . POLYPECTOMY    . ROBOTIC ASSISTED BILATERAL SALPINGO OOPHERECTOMY Bilateral 11/24/2017   Procedure: XI ROBOTIC ASSISTED BILATERAL SALPINGO OOPHORECTOMY;  Surgeon: Everitt Amber, MD;  Location: [facility];  Service: Gynecology;  Laterality: Bilateral;     No current facility-administered medications for this encounter.   Current Outpatient Medications:  .  cyclobenzaprine (FLEXERIL) 5 MG tablet, Take 1 tablet (5 mg total) at bedtime as needed by mouth for muscle spasms., Disp: 90 tablet, Rfl: 0 .  desloratadine (CLARINEX) 5 MG tablet, Take 5 mg by  mouth daily as needed (for allergies.). , Disp: , Rfl:  .  esomeprazole (NEXIUM 24HR CLEAR MINIS) 20 MG capsule, Take 20 mg by mouth daily at 12 noon., Disp: , Rfl:  .  FLUoxetine (PROZAC) 40 MG capsule, Take 1 capsule (40 mg total) by mouth daily., Disp: 90 capsule, Rfl: 1 .  losartan (COZAAR) 100 MG tablet, Take 1 tablet (100 mg total) by mouth daily., Disp: 90 tablet, Rfl: 1 .  metoprolol tartrate (LOPRESSOR) 25 MG tablet, Take 1 tablet (25 mg total) by mouth 2 (two) times daily., Disp: 60 tablet, Rfl: 5 .  Multiple Vitamins-Minerals (EMERGEN-C IMMUNE PLUS PO), Take by mouth., Disp: , Rfl:  .  naproxen (NAPROSYN) 500 MG tablet, TAKE 1 TABLET DAILY AS NEEDED, Disp: 90 tablet, Rfl: 0 .  albuterol (PROAIR HFA) 108 (90 Base) MCG/ACT inhaler, Inhale 2 puffs into the lungs every 4 (four) hours as needed for wheezing or shortness of breath., Disp: 54 g, Rfl: 3 .  azelastine (ASTELIN) 0.1 % nasal spray, Place 2 sprays into both nostrils at bedtime. Use in each nostril as directed, Disp: 30 mL, Rfl: 6 .  nitrofurantoin, macrocrystal-monohydrate, (MACROBID) 100 MG capsule, Take 1 capsule (100 mg total) by mouth 2 (two) times daily for 7 days., Disp: 14 capsule, Rfl: 0 .  Polyethyl Glycol-Propyl Glycol (LUBRICANT EYE DROPS) 0.4-0.3 % SOLN, Place 1-2 drops into both eyes 3 (three) times daily as needed (for dry/irritated eyes.)., Disp: , Rfl:   Allergies Amlodipine; Statins; and Sulfonamide derivatives  Family History  Problem Relation Age of Onset  . Lung cancer Father 41       hx smoking  . Diabetes Mother        late onset  . Hyperlipidemia Mother   . Cancer Mother 65       metholosmia  . Cervical cancer Sister 101  . Cancer Maternal Aunt 60       colon/rectal  . Cancer Paternal Uncle        lung- hx smoking , died 41's  . BRCA 1/2 Daughter        had prophlactic bilat mastectomy  . Esophageal cancer Brother 40  . Breast cancer Paternal Aunt 47  . Breast cancer Paternal Aunt 82  . Lung  cancer Paternal Uncle   . Pancreatic cancer Paternal Uncle 57  . Cancer Cousin        type unk, dx <50  . Coronary artery disease Neg Hx   . Hypertension Neg Hx   . Stroke Neg Hx   . Sudden death Neg Hx   . Heart attack Neg Hx   . Colon polyps Neg Hx   . Rectal cancer Neg Hx   . Stomach cancer Neg Hx     Social History Social History   Tobacco Use  . Smoking status: Former Smoker    Packs/day: 0.30    Years: 3.00    Pack  years: 0.90    Types: Cigarettes  . Smokeless tobacco: Never Used  . Tobacco comment: quit 04-2013 (Chantix)  Substance Use Topics  . Alcohol use: Yes    Alcohol/week: 3.0 standard drinks    Types: 3 Standard drinks or equivalent per week    Comment: a week   . Drug use: No    Review of Systems Constitutional: No fever Cardiovascular: Denies chest pain. Respiratory: Denies shortness of breath. Gastrointestinal: No abdominal pain.  No nausea, no vomiting.  No diarrhea. Genitourinary: positive for dysuria. Musculoskeletal:as above. Skin: Negative for rash.  ____________________________________________   PHYSICAL EXAM:  VITAL SIGNS: ED Triage Vitals  Enc Vitals Group     BP 09/01/18 1709 (!) 149/97     Pulse Rate 09/01/18 1709 (!) 58     Resp 09/01/18 1709 16     Temp 09/01/18 1709 97.7 F (36.5 C)     Temp Source 09/01/18 1709 Oral     SpO2 09/01/18 1709 99 %     Weight 09/01/18 1706 190 lb (86.2 kg)     Height 09/01/18 1706 5' 8.5" (1.74 m)     Head Circumference --      Peak Flow --      Pain Score 09/01/18 1706 2     Pain Loc --      Pain Edu? --      Excl. in [place]? --     Constitutional: Alert and oriented. Well appearing and in no acute distress. ENT      Head: Normocephalic and atraumatic. Cardiovascular: Normal rate, regular rhythm. Grossly normal heart sounds.  Good peripheral circulation. Respiratory: Normal respiratory effort without tachypnea nor retractions. Breath sounds are clear and equal bilaterally. No wheezes, rales,  rhonchi. Gastrointestinal: Soft and nontender.No CVA tenderness. Musculoskeletal: No midline cervical, thoracic or lumbar tenderness to palpation.  Neurologic:  Normal speech and language. Speech is normal. No gait instability.  Skin:  Skin is warm, dry and intact. No rash noted. Psychiatric: Mood and affect are normal. Speech and behavior are normal. Patient exhibits appropriate insight and judgment   ___________________________________________   LABS (all labs ordered are listed, but only abnormal results are displayed)  Labs Reviewed  URINALYSIS, COMPLETE (UACMP) WITH MICROSCOPIC - Abnormal; Notable for the following components:      Result Value   APPearance CLOUDY (*)    Hgb urine dipstick MODERATE (*)    Leukocytes, UA MODERATE (*)    Bacteria, UA MANY (*)    All other components within normal limits  URINE CULTURE    PROCEDURES Procedures   INITIAL IMPRESSION / ASSESSMENT AND PLAN / ED COURSE  Pertinent labs & imaging results that were available during my care of the patient were reviewed by me and considered in my medical decision making (see chart for details).  Well-appearing patient.  No acute distress.  Urinalysis reviewed, suspect UTI.  We will culture urine and treat with oral Macrobid.  Encourage rest, fluids, supportive care.Discussed indication, risks and benefits of medications with patient.  Discussed follow up with Primary care physician this week. Discussed follow up and return parameters including no resolution or any worsening concerns. Patient verbalized understanding and agreed to plan.   ____________________________________________   FINAL CLINICAL IMPRESSION(S) / ED DIAGNOSES  Final diagnoses:  Urinary tract infection with hematuria, site unspecified     ED Discharge Orders         Ordered    nitrofurantoin, macrocrystal-monohydrate, (MACROBID) 100 MG capsule  2 times daily  09/01/18 1804           Note: This dictation was prepared  with Dragon dictation along with smaller phrase technology. Any transcriptional errors that result from this process are unintentional.         Marylene Land, NP 09/01/18 1926

## 2018-09-01 NOTE — Discharge Instructions (Addendum)
Take medication as prescribed. Rest. Drink plenty of fluids.  ° °Follow up with your primary care physician this week as needed. Return to Urgent care for new or worsening concerns.  ° °

## 2018-09-04 LAB — URINE CULTURE

## 2018-09-06 ENCOUNTER — Telehealth (HOSPITAL_COMMUNITY): Payer: Self-pay | Admitting: Emergency Medicine

## 2018-09-06 NOTE — Telephone Encounter (Signed)
Urine culture was positive for Ecoli and was given MACROBID at urgent care visit. Attempted to reach patient. No answer at this time. Voicemail left.

## 2018-09-09 ENCOUNTER — Telehealth: Payer: Self-pay

## 2018-09-09 NOTE — Telephone Encounter (Signed)
Left a voice message concerning patient request to canceled her upcoming appointment. Per 11/14 vm return call.

## 2018-09-10 DIAGNOSIS — E785 Hyperlipidemia, unspecified: Secondary | ICD-10-CM | POA: Diagnosis not present

## 2018-09-10 DIAGNOSIS — Z1501 Genetic susceptibility to malignant neoplasm of breast: Secondary | ICD-10-CM | POA: Diagnosis not present

## 2018-09-10 DIAGNOSIS — F329 Major depressive disorder, single episode, unspecified: Secondary | ICD-10-CM | POA: Diagnosis not present

## 2018-09-10 DIAGNOSIS — J449 Chronic obstructive pulmonary disease, unspecified: Secondary | ICD-10-CM | POA: Diagnosis not present

## 2018-09-10 DIAGNOSIS — Z87440 Personal history of urinary (tract) infections: Secondary | ICD-10-CM | POA: Diagnosis not present

## 2018-09-10 DIAGNOSIS — I10 Essential (primary) hypertension: Secondary | ICD-10-CM | POA: Diagnosis not present

## 2018-09-10 DIAGNOSIS — D126 Benign neoplasm of colon, unspecified: Secondary | ICD-10-CM | POA: Diagnosis not present

## 2018-09-10 DIAGNOSIS — Z1509 Genetic susceptibility to other malignant neoplasm: Secondary | ICD-10-CM | POA: Diagnosis not present

## 2018-09-10 DIAGNOSIS — G4733 Obstructive sleep apnea (adult) (pediatric): Secondary | ICD-10-CM | POA: Diagnosis not present

## 2018-09-10 DIAGNOSIS — K219 Gastro-esophageal reflux disease without esophagitis: Secondary | ICD-10-CM | POA: Diagnosis not present

## 2018-09-10 DIAGNOSIS — F419 Anxiety disorder, unspecified: Secondary | ICD-10-CM | POA: Diagnosis not present

## 2018-09-13 ENCOUNTER — Other Ambulatory Visit: Payer: BLUE CROSS/BLUE SHIELD

## 2018-09-13 ENCOUNTER — Ambulatory Visit: Payer: BLUE CROSS/BLUE SHIELD | Admitting: Oncology

## 2018-09-26 HISTORY — PX: BREAST SURGERY: SHX581

## 2018-09-30 DIAGNOSIS — Z1501 Genetic susceptibility to malignant neoplasm of breast: Secondary | ICD-10-CM | POA: Diagnosis not present

## 2018-09-30 DIAGNOSIS — Z1509 Genetic susceptibility to other malignant neoplasm: Secondary | ICD-10-CM | POA: Diagnosis not present

## 2018-09-30 DIAGNOSIS — Z1502 Genetic susceptibility to malignant neoplasm of ovary: Secondary | ICD-10-CM | POA: Diagnosis not present

## 2018-10-07 ENCOUNTER — Other Ambulatory Visit: Payer: Self-pay | Admitting: Internal Medicine

## 2018-10-07 DIAGNOSIS — Z803 Family history of malignant neoplasm of breast: Secondary | ICD-10-CM | POA: Diagnosis not present

## 2018-10-07 DIAGNOSIS — Z882 Allergy status to sulfonamides status: Secondary | ICD-10-CM | POA: Diagnosis not present

## 2018-10-07 DIAGNOSIS — Z79899 Other long term (current) drug therapy: Secondary | ICD-10-CM | POA: Diagnosis not present

## 2018-10-07 DIAGNOSIS — Z1501 Genetic susceptibility to malignant neoplasm of breast: Secondary | ICD-10-CM | POA: Diagnosis not present

## 2018-10-07 DIAGNOSIS — Z981 Arthrodesis status: Secondary | ICD-10-CM | POA: Diagnosis not present

## 2018-10-07 DIAGNOSIS — Z85048 Personal history of other malignant neoplasm of rectum, rectosigmoid junction, and anus: Secondary | ICD-10-CM | POA: Diagnosis not present

## 2018-10-07 DIAGNOSIS — N6011 Diffuse cystic mastopathy of right breast: Secondary | ICD-10-CM | POA: Diagnosis not present

## 2018-10-07 DIAGNOSIS — N6012 Diffuse cystic mastopathy of left breast: Secondary | ICD-10-CM | POA: Diagnosis not present

## 2018-10-07 DIAGNOSIS — Z9221 Personal history of antineoplastic chemotherapy: Secondary | ICD-10-CM | POA: Diagnosis not present

## 2018-10-07 DIAGNOSIS — Z1509 Genetic susceptibility to other malignant neoplasm: Secondary | ICD-10-CM | POA: Diagnosis not present

## 2018-10-07 DIAGNOSIS — Z9013 Acquired absence of bilateral breasts and nipples: Secondary | ICD-10-CM | POA: Diagnosis not present

## 2018-10-07 DIAGNOSIS — Z1502 Genetic susceptibility to malignant neoplasm of ovary: Secondary | ICD-10-CM | POA: Diagnosis not present

## 2018-10-07 DIAGNOSIS — Z4001 Encounter for prophylactic removal of breast: Secondary | ICD-10-CM | POA: Diagnosis not present

## 2018-10-07 DIAGNOSIS — Z421 Encounter for breast reconstruction following mastectomy: Secondary | ICD-10-CM | POA: Diagnosis not present

## 2018-10-07 DIAGNOSIS — Z9079 Acquired absence of other genital organ(s): Secondary | ICD-10-CM | POA: Diagnosis not present

## 2018-10-07 DIAGNOSIS — Z87891 Personal history of nicotine dependence: Secondary | ICD-10-CM | POA: Diagnosis not present

## 2018-10-07 DIAGNOSIS — G8918 Other acute postprocedural pain: Secondary | ICD-10-CM | POA: Diagnosis not present

## 2018-10-07 DIAGNOSIS — G4733 Obstructive sleep apnea (adult) (pediatric): Secondary | ICD-10-CM | POA: Diagnosis not present

## 2018-10-07 DIAGNOSIS — Z923 Personal history of irradiation: Secondary | ICD-10-CM | POA: Diagnosis not present

## 2018-10-07 NOTE — Telephone Encounter (Signed)
E-scribing is down- Rx for fluoxetine 40mg  printed and faxed to [facility].

## 2018-10-13 DIAGNOSIS — Z1501 Genetic susceptibility to malignant neoplasm of breast: Secondary | ICD-10-CM | POA: Diagnosis not present

## 2018-10-13 DIAGNOSIS — Z1509 Genetic susceptibility to other malignant neoplasm: Secondary | ICD-10-CM | POA: Diagnosis not present

## 2018-10-13 DIAGNOSIS — Z9889 Other specified postprocedural states: Secondary | ICD-10-CM | POA: Diagnosis not present

## 2018-10-22 DIAGNOSIS — Z1509 Genetic susceptibility to other malignant neoplasm: Secondary | ICD-10-CM | POA: Diagnosis not present

## 2018-10-22 DIAGNOSIS — Z1501 Genetic susceptibility to malignant neoplasm of breast: Secondary | ICD-10-CM | POA: Diagnosis not present

## 2018-10-22 DIAGNOSIS — Z9013 Acquired absence of bilateral breasts and nipples: Secondary | ICD-10-CM | POA: Diagnosis not present

## 2018-10-22 DIAGNOSIS — Z9889 Other specified postprocedural states: Secondary | ICD-10-CM | POA: Diagnosis not present

## 2018-10-28 DIAGNOSIS — Z9889 Other specified postprocedural states: Secondary | ICD-10-CM | POA: Diagnosis not present

## 2018-10-28 DIAGNOSIS — Z1509 Genetic susceptibility to other malignant neoplasm: Secondary | ICD-10-CM | POA: Diagnosis not present

## 2018-10-28 DIAGNOSIS — Z803 Family history of malignant neoplasm of breast: Secondary | ICD-10-CM | POA: Diagnosis not present

## 2018-10-28 DIAGNOSIS — Z1501 Genetic susceptibility to malignant neoplasm of breast: Secondary | ICD-10-CM | POA: Diagnosis not present

## 2018-10-28 DIAGNOSIS — Z8481 Family history of carrier of genetic disease: Secondary | ICD-10-CM | POA: Diagnosis not present

## 2018-10-28 DIAGNOSIS — Z9013 Acquired absence of bilateral breasts and nipples: Secondary | ICD-10-CM | POA: Diagnosis not present

## 2018-11-02 DIAGNOSIS — Z1509 Genetic susceptibility to other malignant neoplasm: Secondary | ICD-10-CM | POA: Diagnosis not present

## 2018-11-02 DIAGNOSIS — Z9013 Acquired absence of bilateral breasts and nipples: Secondary | ICD-10-CM | POA: Diagnosis not present

## 2018-11-02 DIAGNOSIS — Z1501 Genetic susceptibility to malignant neoplasm of breast: Secondary | ICD-10-CM | POA: Diagnosis not present

## 2018-11-16 ENCOUNTER — Encounter: Payer: Self-pay | Admitting: Internal Medicine

## 2018-11-16 ENCOUNTER — Ambulatory Visit: Payer: BLUE CROSS/BLUE SHIELD | Admitting: Internal Medicine

## 2018-11-16 VITALS — BP 128/74 | HR 78 | Temp 97.9°F | Resp 16 | Ht 68.0 in | Wt 198.5 lb

## 2018-11-16 DIAGNOSIS — Z1501 Genetic susceptibility to malignant neoplasm of breast: Secondary | ICD-10-CM

## 2018-11-16 DIAGNOSIS — Z1509 Genetic susceptibility to other malignant neoplasm: Secondary | ICD-10-CM

## 2018-11-16 DIAGNOSIS — I10 Essential (primary) hypertension: Secondary | ICD-10-CM

## 2018-11-16 DIAGNOSIS — Z9013 Acquired absence of bilateral breasts and nipples: Secondary | ICD-10-CM

## 2018-11-16 DIAGNOSIS — Z23 Encounter for immunization: Secondary | ICD-10-CM | POA: Diagnosis not present

## 2018-11-16 LAB — BASIC METABOLIC PANEL
BUN: 15 mg/dL (ref 6–23)
CALCIUM: 9.2 mg/dL (ref 8.4–10.5)
CO2: 31 mEq/L (ref 19–32)
CREATININE: 0.76 mg/dL (ref 0.40–1.20)
Chloride: 103 mEq/L (ref 96–112)
GFR: 77.15 mL/min (ref >60.00)
Glucose, Bld: 85 mg/dL (ref 70–99)
POTASSIUM: 4 meq/L (ref 3.5–5.1)
Sodium: 140 mEq/L (ref 135–145)

## 2018-11-16 LAB — CBC WITH DIFFERENTIAL/PLATELET
Basophils Absolute: 0 10*3/uL (ref 0.0–0.1)
Basophils Relative: 0.8 % (ref 0.0–3.0)
EOS ABS: 0.2 10*3/uL (ref 0.0–0.7)
EOS PCT: 4.4 % (ref 0.0–5.0)
HCT: 41.7 % (ref 36.0–46.0)
HEMOGLOBIN: 13.9 g/dL (ref 12.0–15.0)
Lymphocytes Relative: 33 % (ref 12.0–46.0)
Lymphs Abs: 1.8 10*3/uL (ref 0.7–4.0)
MCHC: 33.4 g/dL (ref 30.0–36.0)
MCV: 89.3 fl (ref 78.0–100.0)
MONO ABS: 0.4 10*3/uL (ref 0.1–1.0)
Monocytes Relative: 6.8 % (ref 3.0–12.0)
Neutro Abs: 3.1 10*3/uL (ref 1.4–7.7)
Neutrophils Relative %: 55 % (ref 43.0–77.0)
Platelets: 303 10*3/uL (ref 150.0–400.0)
RBC: 4.67 Mil/uL (ref 3.87–5.11)
RDW: 13.6 % (ref 11.5–15.5)
WBC: 5.6 10*3/uL (ref 4.0–10.5)

## 2018-11-16 NOTE — Progress Notes (Signed)
Pre visit review using our clinic review tool, if applicable. No additional management support is needed unless otherwise documented below in the visit note. 

## 2018-11-16 NOTE — Progress Notes (Signed)
Subjective:    Patient ID: Sheila Fuller, female    DOB: August 23, 1956, 63 y.o.   MRN: [id number]  DOS:  11/16/2018 Type of visit - description: Routine follow-up Since the last visit had bilateral mastectomy, doing great. Emotionally doing well, she has   self decrease her fluoxetine dose. Ambulatory BPs normal when checked at other offices  Review of Systems No anxiety or depression at this point   Past Medical History:  Diagnosis Date  . Allergic rhinitis   . Allergy   . Anxiety   . Arthritis   . Asthma    seasonally  . BRCA1 positive   . Cancer ([place])    skin  . 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   . Cataract    will remove left end of 10-2016  . Class 1 obesity   . Depression   . Family history of BRCA gene mutation    daughter BRCA1 +  . Family history of breast cancer   . Family history of pancreatic cancer   . GERD (gastroesophageal reflux disease)    takes zantac maybe 2/week  . Hyperlipidemia   . Hypertension   . Lactose intolerance 10/04/2013  . Microhematuria 01/2006   saw Dr.Peterson, renal u/s: r pyelocaliectasis but CT was neg, cysto neg  . Shortness of breath    only with asthma flare up  . Sleep apnea    rarely uses cpap    Past Surgical History:  Procedure Laterality Date  . ANTERIOR CERVICAL DECOMP/DISCECTOMY FUSION N/A 08/23/2014   Procedure: C5-6 Anterior Cervical Discectomy and Fusion, Allograft, Plate;  Surgeon: Marybelle Killings, MD;  Location: [facility];  Service: Orthopedics;  Laterality: N/A;  . BREAST SURGERY  09/2018   BRCA-1 gene positive, +reconstruction  . cervical cauterization    . CESAREAN SECTION    . GANGLION CYST EXCISION    . NASAL SEPTUM SURGERY    . POLYPECTOMY    . ROBOTIC ASSISTED BILATERAL SALPINGO OOPHERECTOMY Bilateral 11/24/2017   Procedure: XI ROBOTIC ASSISTED BILATERAL SALPINGO OOPHORECTOMY;  Surgeon: Everitt Amber, MD;  Location: [facility];  Service: Gynecology;  Laterality:  Bilateral;    Social History   Socioeconomic History  . Marital status: Married    Spouse name: Not on file  . Number of children: 1  . Years of education: Not on file  . Highest education level: Not on file  Occupational History  . 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 Financial resource strain: Not on file  . Food insecurity:    Worry: Not on file    Inability: Not on file  . Transportation needs:    Medical: Not on file    Non-medical: Not on file  Tobacco Use  . Smoking status: Former Smoker    Packs/day: 0.30    Years: 3.00    Pack years: 0.90    Types: Cigarettes  . Smokeless tobacco: Never Used  . Tobacco comment: quit 04-2013 (Chantix)  Substance and Sexual Activity  . Alcohol use: Yes    Alcohol/week: 3.0 standard drinks    Types: 3 Standard drinks or equivalent per week    Comment: a week   . Drug use: No  . Sexual activity: Yes    Birth control/protection: Post-menopausal  Lifestyle  . Physical activity:    Days per week: Not on file    Minutes  per session: Not on file  . Stress: Not on file  Relationships  . Social connections:    Talks on phone: Not on file    Gets together: Not on file    Attends religious service: Not on file    Active member of club or organization: Not on file    Attends meetings of clubs or organizations: Not on file    Relationship status: Not on file  . Intimate partner violence:    Fear of current or ex partner: Not on file    Emotionally abused: Not on file    Physically abused: Not on file    Forced sexual activity: Not on file  Other Topics Concern  . Not on file  Social History Narrative   remarried 10-11, 1 child      Allergies as of 11/16/2018      Reactions   Amlodipine Swelling, Other (See Comments)   Edema/swelling in legs.   Statins    REACTION: myalgia   Sulfonamide Derivatives Rash      Medication List       Accurate as of November 16, 2018  8:59 PM.  Always use your most recent med list.        albuterol 108 (90 Base) MCG/ACT inhaler Commonly known as:  PROAIR HFA Inhale 2 puffs into the lungs every 4 (four) hours as needed for wheezing or shortness of breath.   azelastine 0.1 % nasal spray Commonly known as:  ASTELIN Place 2 sprays into both nostrils at bedtime. Use in each nostril as directed   cyclobenzaprine 5 MG tablet Commonly known as:  FLEXERIL Take 1 tablet (5 mg total) at bedtime as needed by mouth for muscle spasms.   desloratadine 5 MG tablet Commonly known as:  CLARINEX Take 5 mg by mouth daily as needed (for allergies.).   EMERGEN-C IMMUNE PLUS PO Take by mouth.   losartan 100 MG tablet Commonly known as:  COZAAR Take 1 tablet (100 mg total) by mouth daily.   LUBRICANT EYE DROPS 0.4-0.3 % Soln Generic drug:  Polyethyl Glycol-Propyl Glycol Place 1-2 drops into both eyes 3 (three) times daily as needed (for dry/irritated eyes.).   metoprolol tartrate 25 MG tablet Commonly known as:  LOPRESSOR Take 1 tablet (25 mg total) by mouth 2 (two) times daily.   naproxen 500 MG tablet Commonly known as:  NAPROSYN TAKE 1 TABLET DAILY AS NEEDED   NEXIUM 24HR CLEAR MINIS 20 MG capsule Generic drug:  esomeprazole Take 20 mg by mouth daily at 12 noon.           Objective:   Physical Exam BP 128/74 (BP Location: Left Arm, Patient Position: Sitting, Cuff Size: Normal)   Pulse 78   Temp 97.9 F (36.6 C) (Oral)   Resp 16   Ht _0  (1.727 m)   Wt 198 lb 8 oz (90 kg)   SpO2 97%   BMI 30.18 kg/m   General:   Well developed, NAD, BMI noted. HEENT:  Normocephalic . Face symmetric, atraumatic Lungs:  CTA B Normal respiratory effort, no intercostal retractions, no accessory muscle use. Heart: RRR,  no murmur.  No pretibial edema bilaterally  Skin: Not pale. Not jaundice Neurologic:  alert & oriented X3.  Speech normal, gait appropriate for age and unassisted Psych--  Cognition and judgment appear  intact.  Cooperative with normal attention span and concentration.  Behavior appropriate. No anxious or depressed appearing.      Assessment  Assessment HTN Hyperlipidemia Anxiety, depression GERD Seasonal asthma Allergies Obesity : BMI~ 28 plus HTN, hyperlipidemia, OSA OSA: on Cpap consistently as off 03/2018 MSK: DJD, occasionally uses pain medication H/o Microhematuria 2007, Dr. Terance Hart, renal US showed right pyelocaliectasis, CT was negative, cystoscopy negative + BRCA: personal h/o (dx 2018)  and FH BRCA (daughter, sister).  See office visit 10/14/2017 Bilateral mastectomy 09/2018 H/o Carcinoma in situ, perianal skin, XRT, chemotherapy 2000. Not further follow-ups with specialist  PLAN:  HTN: BP today is very good, ambulatory BPs when checked at other offices normal, continue losartan, metoprolol, check a BMP and CBC Anxiety depression: Stress at work is expected to decrease, patient is doing well at this point, she has already decreased her fluoxetine dose significantly (1 tab every 4 days)  Plan: Half tablet every 3 to 4 days for the next week then stop.  Okay to restart if needed. Hyperlipidemia: Not fasting today, check labs on RTC + BRCA: Had a successful bilateral mastectomy 09/2018, to get a reconstruction at some point in 2020 Preventive care: Tdap today RTC 4 to 5 months CPX fasting

## 2018-11-16 NOTE — Patient Instructions (Signed)
GO TO THE LAB : Get the blood work     GO TO THE FRONT DESK Schedule your next appointment      Decrease fluoxetine to half tablet every 3 or 4 days for the next week, then stop

## 2018-11-16 NOTE — Assessment & Plan Note (Signed)
HTN: BP today is very good, ambulatory BPs when checked at other offices normal, continue losartan, metoprolol, check a BMP and CBC Anxiety depression: Stress at work is expected to decrease, patient is doing well at this point, she has already decreased her fluoxetine dose significantly (1 tab every 4 days)  Plan: Half tablet every 3 to 4 days for the next week then stop.  Okay to restart if needed. Hyperlipidemia: Not fasting today, check labs on RTC + BRCA: Had a successful bilateral mastectomy 09/2018, to get a reconstruction at some point in 2020 Preventive care: Tdap today RTC 4 to 5 months CPX fasting

## 2018-11-20 ENCOUNTER — Other Ambulatory Visit: Payer: Self-pay | Admitting: Internal Medicine

## 2018-11-22 DIAGNOSIS — Z1509 Genetic susceptibility to other malignant neoplasm: Secondary | ICD-10-CM | POA: Diagnosis not present

## 2018-11-22 DIAGNOSIS — Z1501 Genetic susceptibility to malignant neoplasm of breast: Secondary | ICD-10-CM | POA: Diagnosis not present

## 2018-11-22 DIAGNOSIS — Z9889 Other specified postprocedural states: Secondary | ICD-10-CM | POA: Diagnosis not present

## 2018-11-22 DIAGNOSIS — Z9013 Acquired absence of bilateral breasts and nipples: Secondary | ICD-10-CM | POA: Diagnosis not present

## 2018-12-23 DIAGNOSIS — M542 Cervicalgia: Secondary | ICD-10-CM | POA: Diagnosis not present

## 2018-12-23 DIAGNOSIS — M546 Pain in thoracic spine: Secondary | ICD-10-CM | POA: Diagnosis not present

## 2018-12-23 DIAGNOSIS — M545 Low back pain: Secondary | ICD-10-CM | POA: Diagnosis not present

## 2018-12-27 DIAGNOSIS — Z1501 Genetic susceptibility to malignant neoplasm of breast: Secondary | ICD-10-CM | POA: Diagnosis not present

## 2018-12-27 DIAGNOSIS — Z9013 Acquired absence of bilateral breasts and nipples: Secondary | ICD-10-CM | POA: Diagnosis not present

## 2018-12-27 DIAGNOSIS — Z1509 Genetic susceptibility to other malignant neoplasm: Secondary | ICD-10-CM | POA: Diagnosis not present

## 2018-12-27 DIAGNOSIS — Z9889 Other specified postprocedural states: Secondary | ICD-10-CM | POA: Diagnosis not present

## 2018-12-31 ENCOUNTER — Encounter: Payer: Self-pay | Admitting: Emergency Medicine

## 2018-12-31 ENCOUNTER — Ambulatory Visit
Admission: EM | Admit: 2018-12-31 | Discharge: 2018-12-31 | Disposition: A | Discharge status: Home / Self Care | Payer: BLUE CROSS/BLUE SHIELD | Attending: Internal Medicine | Admitting: Internal Medicine

## 2018-12-31 DIAGNOSIS — M545 Low back pain, unspecified: Secondary | ICD-10-CM

## 2018-12-31 DIAGNOSIS — G8929 Other chronic pain: Secondary | ICD-10-CM

## 2018-12-31 LAB — URINALYSIS, COMPLETE (UACMP) WITH MICROSCOPIC
BILIRUBIN URINE: NEGATIVE
Glucose, UA: NEGATIVE mg/dL
KETONES UR: NEGATIVE mg/dL
NITRITE: NEGATIVE
PH: 6 (ref 5.0–8.0)
Protein, ur: NEGATIVE mg/dL
Specific Gravity, Urine: 1.01 (ref 1.005–1.030)

## 2018-12-31 NOTE — ED Triage Notes (Signed)
Pt c/o lower back pain. Started about a week ago. She reports that when she gets UTI's she does not get the typical symptoms she gets the lower back pain, so she wants to be sure this is not related.

## 2018-12-31 NOTE — ED Provider Notes (Signed)
[facility]    CSN: [id number] Arrival date & time: 12/31/18  1230     History   Chief Complaint Chief Complaint  Patient presents with  . Back Pain    HPI Sheila Fuller is a 63 y.o. female history of BRCA1 gene recently had bilateral mastectomy to urgent care with lower back pain.  Patient recently started exercising 2 weeks ago and has been experiencing lower back pain.  Pain is of moderate intensity.  Aggravated by movement.  She has not tried any over-the-counter medications.  Denies any dysuria urgency or frequency.  No numbness or tingling in the lower extremities.  She is prone to UTI and wants to be evaluated for that.Marland Kitchen   HPI  Past Medical History:  Diagnosis Date  . Allergic rhinitis   . Allergy   . Anxiety   . Arthritis   . Asthma    seasonally  . BRCA1 positive   . Cancer ([place])    skin  . 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   . Cataract    will remove left end of 10-2016  . Class 1 obesity   . Depression   . Family history of BRCA gene mutation    daughter BRCA1 +  . Family history of breast cancer   . Family history of pancreatic cancer   . GERD (gastroesophageal reflux disease)    takes zantac maybe 2/week  . Hyperlipidemia   . Hypertension   . Lactose intolerance 10/04/2013  . Microhematuria 01/2006   saw Dr.Peterson, renal u/s: r pyelocaliectasis but CT was neg, cysto neg  . Shortness of breath    only with asthma flare up  . Sleep apnea    rarely uses cpap    Patient Active Problem List   Diagnosis Date Noted  . S/P mastectomy, bilateral 10/22/2018  . High risk of ovarian cancer 09/07/2017  . BRCA1 positive 08/19/2017  . Family history of BRCA gene mutation   . Family history of breast cancer   . Family history of pancreatic cancer   . Obesity 06/02/2016  . PCP NOTES >>>>>>>> 10/19/2015  . Deformity of metatarsal bone of left foot 08/21/2015  . Metatarsalgia of left foot  08/21/2015  . Pronation deformity of left foot 08/21/2015  . Essential hypertension 01/03/2015  . HNP (herniated nucleus pulposus), cervical 08/23/2014  . Lactose intolerance 10/04/2013  . Lipoma of back 08/05/2013  . Annual physical exam 11/28/2011  . COPD (chronic obstructive pulmonary disease) ([place]) 11/28/2011  . Tobacco abuse 06/17/2011  . De Quervain's disease (tenosynovitis) 05/01/2011  . DJD (degenerative joint disease) 04/25/2011  . INSOMNIA-SLEEP DISORDER-UNSPEC 08/20/2009  . OSA (obstructive sleep apnea) 07/30/2008  . NEOP, MALIGNANT, SKIN NOS 05/03/2007  . COLONIC POLYPS 05/03/2007  . HYPERLIPIDEMIA 05/03/2007  . Anxiety and depression 05/03/2007  . ALLERGIC RHINITIS 05/03/2007  . GERD 05/03/2007    Past Surgical History:  Procedure Laterality Date  . ANTERIOR CERVICAL DECOMP/DISCECTOMY FUSION N/A 08/23/2014   Procedure: C5-6 Anterior Cervical Discectomy and Fusion, Allograft, Plate;  Surgeon: Marybelle Killings, MD;  Location: [facility];  Service: Orthopedics;  Laterality: N/A;  . BREAST SURGERY  09/2018   BRCA-1 gene positive, +reconstruction  . cervical cauterization    . CESAREAN SECTION    . GANGLION CYST EXCISION    . NASAL SEPTUM SURGERY    . POLYPECTOMY    . ROBOTIC ASSISTED BILATERAL SALPINGO OOPHERECTOMY Bilateral 11/24/2017  Procedure: XI ROBOTIC ASSISTED BILATERAL SALPINGO OOPHORECTOMY;  Surgeon: Everitt Amber, MD;  Location: [facility];  Service: Gynecology;  Laterality: Bilateral;    OB History    Gravida  2   Para  1   Term  1   Preterm      AB  1   Living  1     SAB      TAB  1   Ectopic      Multiple      Live Births               Home Medications    Prior to Admission medications   Medication Sig Start Date End Date Taking? Authorizing Provider  albuterol (PROAIR HFA) 108 (90 Base) MCG/ACT inhaler Inhale 2 puffs into the lungs every 4 (four) hours as needed for wheezing or shortness of breath. 06/08/18  Yes Paz, Alda Berthold, MD  azelastine  (ASTELIN) 0.1 % nasal spray Place 2 sprays into both nostrils at bedtime. Use in each nostril as directed 08/25/18  Yes Paz, Alda Berthold, MD  cyclobenzaprine (FLEXERIL) 5 MG tablet Take 1 tablet (5 mg total) at bedtime as needed by mouth for muscle spasms. 09/14/17  Yes Paz, Alda Berthold, MD  desloratadine (CLARINEX) 5 MG tablet Take 5 mg by mouth daily as needed (for allergies.).  04/07/16  Yes [provider]  esomeprazole (NEXIUM 24HR CLEAR MINIS) 20 MG capsule Take 20 mg by mouth daily at 12 noon.   Yes [provider]  losartan (COZAAR) 100 MG tablet Take 1 tablet (100 mg total) by mouth daily. 07/12/18  Yes Paz, Alda Berthold, MD  metoprolol tartrate (LOPRESSOR) 25 MG tablet Take 1 tablet (25 mg total) by mouth 2 (two) times daily. 07/02/18  Yes Paz, Alda Berthold, MD  Multiple Vitamins-Minerals (EMERGEN-C IMMUNE PLUS PO) Take by mouth.   Yes [provider]  naproxen (NAPROSYN) 500 MG tablet Take 1 tablet (500 mg total) by mouth daily as needed for moderate pain. 11/22/18  Yes Paz, Alda Berthold, MD  Polyethyl Glycol-Propyl Glycol (LUBRICANT EYE DROPS) 0.4-0.3 % SOLN Place 1-2 drops into both eyes 3 (three) times daily as needed (for dry/irritated eyes.).   Yes [provider]    Family History Family History  Problem Relation Age of Onset  . Lung cancer Father 83       hx smoking  . Diabetes Mother        late onset  . Hyperlipidemia Mother   . Cancer Mother 35       metholosmia  . Cervical cancer Sister 52  . Cancer Maternal Aunt 60       colon/rectal  . Cancer Paternal Uncle        lung- hx smoking , died 1's  . BRCA 1/2 Daughter        had prophlactic bilat mastectomy  . Esophageal cancer Brother 71  . Breast cancer Paternal Aunt 63  . Breast cancer Paternal Aunt 42  . Lung cancer Paternal Uncle   . Pancreatic cancer Paternal Uncle 90  . Cancer Cousin        type unk, dx <50  . Coronary artery disease Neg Hx   . Hypertension Neg Hx   . Stroke Neg Hx   . Sudden death  Neg Hx   . Heart attack Neg Hx   . Colon polyps Neg Hx   . Rectal cancer Neg Hx   . Stomach cancer Neg Hx  Social History Social History   Tobacco Use  . Smoking status: Former Smoker    Packs/day: 0.30    Years: 3.00    Pack years: 0.90    Types: Cigarettes  . Smokeless tobacco: Never Used  . Tobacco comment: quit 04-2013 ([facility]ix)  Substance Use Topics  . Alcohol use: Yes    Alcohol/week: 3.0 standard drinks    Types: 3 Standard drinks or equivalent per week    Comment: a week   . Drug use: No     Allergies   Amlodipine; Statins; and Sulfonamide derivatives   Review of Systems Review of Systems  Constitutional: Negative for activity change.  HENT: Negative for ear discharge and ear pain.   Respiratory: Negative for apnea, cough, chest tightness and wheezing.   Gastrointestinal: Negative for abdominal distention, abdominal pain, diarrhea, nausea and vomiting.  Genitourinary: Negative for dysuria, frequency and urgency.  Musculoskeletal: Negative for arthralgias, joint swelling and neck pain.  Skin: Negative for rash.  Neurological: Negative for dizziness and headaches.     Physical Exam Triage Vital Signs ED Triage Vitals  Enc Vitals Group     BP 12/31/18 1256 118/86     Pulse Rate 12/31/18 1256 76     Resp 12/31/18 1256 18     Temp 12/31/18 1256 97.6 F (36.4 C)     Temp Source 12/31/18 1256 Oral     SpO2 12/31/18 1256 97 %     Weight 12/31/18 1250 196 lb (88.9 kg)     Height 12/31/18 1250 5' 7.5" (1.715 m)     Head Circumference --      Peak Flow --      Pain Score 12/31/18 1250 3     Pain Loc --      Pain Edu? --      Excl. in [place]? --    No data found.  Updated Vital Signs BP 118/86 (BP Location: Left Arm)   Pulse 76   Temp 97.6 F (36.4 C) (Oral)   Resp 18   Ht 5' 7.5" (1.715 m)   Wt 88.9 kg   SpO2 97%   BMI 30.24 kg/m   Visual Acuity Right Eye Distance:   Left Eye Distance:   Bilateral Distance:    Right Eye Near:   Left Eye  Near:    Bilateral Near:     Physical Exam Vitals signs and nursing note reviewed.  Constitutional:      Appearance: Normal appearance.  Neck:     Musculoskeletal: Normal range of motion and neck supple.  Cardiovascular:     Rate and Rhythm: Normal rate and regular rhythm.     Pulses: Normal pulses.     Heart sounds: Normal heart sounds.  Pulmonary:     Effort: Pulmonary effort is normal.     Breath sounds: Normal breath sounds.  Abdominal:     General: Bowel sounds are normal.     Palpations: Abdomen is soft.  Skin:    General: Skin is warm.     Capillary Refill: Capillary refill takes less than 2 seconds.     Findings: No lesion or rash.  Neurological:     General: No focal deficit present.     Mental Status: She is alert and oriented to person, place, and time.     Sensory: No sensory deficit.     Motor: No weakness.     Coordination: Coordination normal.      UC Treatments / Results  Labs (all  labs ordered are listed, but only abnormal results are displayed) Labs Reviewed  URINALYSIS, COMPLETE (UACMP) WITH MICROSCOPIC - Abnormal; Notable for the following components:      Result Value   Hgb urine dipstick SMALL (*)    Leukocytes,Ua TRACE (*)    Bacteria, UA FEW (*)    All other components within normal limits    EKG None  Radiology No results found.  Procedures Procedures (including critical care time)  Medications Ordered in UC Medications - No data to display  Initial Impression / Assessment and Plan / UC Course  I have reviewed the triage vital signs and the nursing notes.  Pertinent labs & imaging results that were available during my care of the patient were reviewed by me and considered in my medical decision making (see chart for details).     1. Lower back pain, musculoskeletal: NSAIDS as needed Back strengthening exercises Urinalysis is negative for urinary tract infection  Final Clinical Impressions(s) / UC Diagnoses   Final  diagnoses:  Chronic low back pain without sciatica, unspecified back pain laterality   Discharge Instructions   None    ED Prescriptions    None     Controlled Substance Prescriptions [place] Controlled Substance Registry consulted? No   Chase Picket, MD 12/31/18 1355

## 2019-01-08 ENCOUNTER — Other Ambulatory Visit: Payer: Self-pay | Admitting: Internal Medicine

## 2019-01-20 ENCOUNTER — Other Ambulatory Visit: Payer: Self-pay | Admitting: Internal Medicine

## 2019-02-21 ENCOUNTER — Other Ambulatory Visit: Payer: Self-pay | Admitting: Internal Medicine

## 2019-03-23 ENCOUNTER — Ambulatory Visit (INDEPENDENT_AMBULATORY_CARE_PROVIDER_SITE_OTHER): Payer: BLUE CROSS/BLUE SHIELD | Admitting: Internal Medicine

## 2019-03-23 ENCOUNTER — Encounter: Payer: Self-pay | Admitting: Internal Medicine

## 2019-03-23 ENCOUNTER — Other Ambulatory Visit: Payer: Self-pay

## 2019-03-23 ENCOUNTER — Telehealth: Payer: Self-pay | Admitting: Internal Medicine

## 2019-03-23 DIAGNOSIS — E28319 Asymptomatic premature menopause: Secondary | ICD-10-CM | POA: Diagnosis not present

## 2019-03-23 DIAGNOSIS — Z0000 Encounter for general adult medical examination without abnormal findings: Secondary | ICD-10-CM | POA: Diagnosis not present

## 2019-03-23 MED ORDER — ZOSTER VAC RECOMB ADJUVANTED 50 MCG/0.5ML IM SUSR: 0.5000 mL | Freq: Once | INTRAMUSCULAR | 1 refills | Status: AC

## 2019-03-23 NOTE — Telephone Encounter (Signed)
Return in about 6 months (around 09/23/2019) for routine visit , 20 minutes, fasting optional.   Check out comments: Schedule labs to be done within few days   LEFT MESSAGE TO CALLBACK

## 2019-03-23 NOTE — Progress Notes (Signed)
Subjective:    Patient ID: Sheila Fuller, female    DOB: 08-23-1956, 62 y.o.   MRN: [id number]  DOS:  03/23/2019 Type of visit - description: Virtual Visit via Video Note  I connected with@ on 03/23/19 at  9:40 AM EDT by a video enabled telemedicine application and verified that I am speaking with the correct person using two identifiers.   THIS ENCOUNTER IS A VIRTUAL VISIT DUE TO COVID-19 - PATIENT WAS NOT SEEN IN THE OFFICE. PATIENT HAS CONSENTED TO VIRTUAL VISIT / TELEMEDICINE VISIT   Location of patient: home  Location of provider: office  I discussed the limitations of evaluation and management by telemedicine and the availability of in person appointments. The patient expressed understanding and agreed to proceed.  History of Present Illness: Here for CPX, multiple other issues discussed     Wt Readings from Last 3 Encounters:  12/31/18 196 lb (88.9 kg)  11/16/18 198 lb 8 oz (90 kg)  09/01/18 190 lb (86.2 kg)   BP Readings from Last 3 Encounters:  12/31/18 118/86  11/16/18 128/74  09/01/18 (!) 149/97     Review of Systems COVID-19: Doing well, working from home. Stress has decreased, working less hours per week and working from home. Still has a difficult time sleeping.  Other than above, a 14 point review of systems is negative   Past Medical History:  Diagnosis Date  . Allergic rhinitis   . Allergy   . Anxiety   . Arthritis   . Asthma    seasonally  . BRCA1 positive   . Cancer ([place])    skin  . 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   . Cataract    will remove left end of 10-2016  . Class 1 obesity   . Depression   . Family history of BRCA gene mutation    daughter BRCA1 +  . Family history of breast cancer   . Family history of pancreatic cancer   . GERD (gastroesophageal reflux disease)    takes zantac maybe 2/week  . Hyperlipidemia   . Hypertension   . Lactose intolerance 10/04/2013  .  Microhematuria 01/2006   saw Dr.Peterson, renal u/s: r pyelocaliectasis but CT was neg, cysto neg  . Shortness of breath    only with asthma flare up  . Sleep apnea    rarely uses cpap    Past Surgical History:  Procedure Laterality Date  . ANTERIOR CERVICAL DECOMP/DISCECTOMY FUSION N/A 08/23/2014   Procedure: C5-6 Anterior Cervical Discectomy and Fusion, Allograft, Plate;  Surgeon: Marybelle Killings, MD;  Location: [facility];  Service: Orthopedics;  Laterality: N/A;  . BREAST SURGERY  09/2018   BRCA-1 gene positive, +reconstruction  . cervical cauterization    . CESAREAN SECTION    . GANGLION CYST EXCISION    . NASAL SEPTUM SURGERY    . POLYPECTOMY    . ROBOTIC ASSISTED BILATERAL SALPINGO OOPHERECTOMY Bilateral 11/24/2017   Procedure: XI ROBOTIC ASSISTED BILATERAL SALPINGO OOPHORECTOMY;  Surgeon: Everitt Amber, MD;  Location: [facility];  Service: Gynecology;  Laterality: Bilateral;    Social History   Socioeconomic History  . Marital status: Married    Spouse name: Not on file  . Number of children: 1  . Years of education: Not on file  . Highest education level: Not on file  Occupational History  . 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 Financial resource strain: Not on file  . Food insecurity:    Worry: Not on file    Inability: Not on file  . Transportation needs:    Medical: Not on file    Non-medical: Not on file  Tobacco Use  . Smoking status: Former Smoker    Packs/day: 0.30    Years: 3.00    Pack years: 0.90    Types: Cigarettes  . Smokeless tobacco: Never Used  . Tobacco comment: quit 04-2013 ([facility])  Substance and Sexual Activity  . Alcohol use: Yes    Alcohol/week: 3.0 standard drinks    Types: 3 Standard drinks or equivalent per week    Comment: a week   . Drug use: No  . Sexual activity: Yes    Birth control/protection: Post-menopausal  Lifestyle  . Physical activity:    Days per week: Not on file    Minutes per  session: Not on file  . Stress: Not on file  Relationships  . Social connections:    Talks on phone: Not on file    Gets together: Not on file    Attends religious service: Not on file    Active member of club or organization: Not on file    Attends meetings of clubs or organizations: Not on file    Relationship status: Not on file  . Intimate partner violence:    Fear of current or ex partner: Not on file    Emotionally abused: Not on file    Physically abused: Not on file    Forced sexual activity: Not on file  Other Topics Concern  . Not on file  Social History Narrative   remarried 10-11, 1 child     Family History  Problem Relation Age of Onset  . Lung cancer Father 41       hx smoking  . Diabetes Mother        late onset  . Hyperlipidemia Mother   . Cancer Mother 46       metholosmia  . Cervical cancer Sister 59  . Cancer Maternal Aunt 60       colon/rectal  . Cancer Paternal Uncle        lung- hx smoking , died 77's  . BRCA 1/2 Daughter        had prophlactic bilat mastectomy  . Esophageal cancer Brother 33  . Breast cancer Paternal Aunt 22  . Breast cancer Paternal Aunt 32  . Lung cancer Paternal Uncle   . Pancreatic cancer Paternal Uncle 48  . Cancer Cousin        type unk, dx <50  . Coronary artery disease Neg Hx   . Hypertension Neg Hx   . Stroke Neg Hx   . Sudden death Neg Hx   . Heart attack Neg Hx   . Colon polyps Neg Hx   . Rectal cancer Neg Hx   . Stomach cancer Neg Hx      Allergies as of 03/23/2019      Reactions   Amlodipine Swelling, Other (See Comments)   Edema/swelling in legs.   Statins    REACTION: myalgia   Sulfonamide Derivatives Rash      Medication List       Accurate as of Mar 23, 2019  9:34 AM. If you have any questions, ask your nurse or doctor.        albuterol 108 (90 Base) MCG/ACT inhaler Commonly known as:  ProAir  HFA Inhale 2 puffs into the lungs every 4 (four) hours as needed for wheezing or shortness of  breath.   azelastine 0.1 % nasal spray Commonly known as:  ASTELIN Place 2 sprays into both nostrils at bedtime. Use in each nostril as directed   cyclobenzaprine 5 MG tablet Commonly known as:  FLEXERIL Take 1 tablet (5 mg total) at bedtime as needed by mouth for muscle spasms.   desloratadine 5 MG tablet Commonly known as:  CLARINEX Take 5 mg by mouth daily as needed (for allergies.).   EMERGEN-C IMMUNE PLUS PO Take by mouth.   losartan 100 MG tablet Commonly known as:  COZAAR Take 1 tablet (100 mg total) by mouth daily.   Lubricant Eye Drops 0.4-0.3 % Soln Generic drug:  Polyethyl Glycol-Propyl Glycol Place 1-2 drops into both eyes 3 (three) times daily as needed (for dry/irritated eyes.).   metoprolol tartrate 25 MG tablet Commonly known as:  LOPRESSOR Take 1 tablet (25 mg total) by mouth 2 (two) times daily.   naproxen 500 MG tablet Commonly known as:  NAPROSYN Take 1 tablet (500 mg total) by mouth daily as needed for moderate pain.   NexIUM 24HR Clear Minis 20 MG capsule Generic drug:  esomeprazole Take 20 mg by mouth daily at 12 noon.           Objective:   Physical Exam There were no vitals taken for this visit. This is a virtual video visit.  Alert oriented x3, no apparent distress Weight: 200 pounds    Assessment    Assessment HTN Hyperlipidemia Anxiety, depression GERD Seasonal asthma Allergies Obesity : BMI~ 28 plus HTN, hyperlipidemia, OSA OSA: on Cpap consistently as off 03/2018 MSK: DJD, occasionally uses pain medication H/o Microhematuria 2007, Dr. Terance Hart, renal US showed right pyelocaliectasis, CT was negative, cystoscopy negative + BRCA: personal h/o (dx 2018)  and FH BRCA (daughter, sister).  See office visit 10/14/2017 Bilateral mastectomy 09/2018 H/o Carcinoma in situ, perianal skin, XRT, chemotherapy 2000. Not further follow-ups with specialist  PLAN:  HTN: The patient will message me with her ambulatory BPs.  Until then we will  continue with losartan, metoprolol. Addendum: BPs 142/93, pulse 60 138/97 pulse 63 For now will recommend to continue same medications, exercise, low-salt diet and send me another set of vitals in 4 weeks. Hyperlipidemia: Diet controlled, checking labs Anxiety depression: Since the last office visit, she went off fluoxetine and anxiety is not a major issue at this point. Insomnia: Having a hard time "turn the brain off" and be able to fall asleep, we talk about good sleep habits, Ambien?.  Declined.  We agreed she will try melatonin. + BRCA: Status post bilateral mastectomy last year, to have further reconstructive surgery in October 2020 RTC 6 months    I discussed the assessment and treatment plan with the patient. The patient was provided an opportunity to ask questions and all were answered. The patient agreed with the plan and demonstrated an understanding of the instructions.   The patient was advised to call back or seek an in-person evaluation if the symptoms worsen or if the condition fails to improve as anticipated.

## 2019-03-23 NOTE — Assessment & Plan Note (Addendum)
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

## 2019-03-24 ENCOUNTER — Encounter: Payer: Self-pay | Admitting: Internal Medicine

## 2019-03-24 NOTE — Assessment & Plan Note (Signed)
HTN: The patient will message me with her ambulatory BPs.  Until then we will continue with losartan, metoprolol. Addendum: BPs 142/93, pulse 60 138/97 pulse 63 For now will recommend to continue same medications, exercise, low-salt diet and send me another set of vitals in 4 weeks. Hyperlipidemia: Diet controlled, checking labs Anxiety depression: Since the last office visit, she went off fluoxetine and anxiety is not a major issue at this point. Insomnia: Having a hard time "turn the brain off" and be able to fall asleep, we talk about good sleep habits, Ambien?.  Declined.  We agreed she will try melatonin. + BRCA: Status post bilateral mastectomy last year, to have further reconstructive surgery in October 2020 RTC 6 months

## 2019-04-07 ENCOUNTER — Encounter: Payer: Self-pay | Admitting: Internal Medicine

## 2019-04-20 DIAGNOSIS — H33312 Horseshoe tear of retina without detachment, left eye: Secondary | ICD-10-CM | POA: Diagnosis not present

## 2019-04-20 DIAGNOSIS — H35411 Lattice degeneration of retina, right eye: Secondary | ICD-10-CM | POA: Diagnosis not present

## 2019-04-20 DIAGNOSIS — H43813 Vitreous degeneration, bilateral: Secondary | ICD-10-CM | POA: Diagnosis not present

## 2019-04-20 DIAGNOSIS — H33321 Round hole, right eye: Secondary | ICD-10-CM | POA: Diagnosis not present

## 2019-04-21 ENCOUNTER — Encounter: Payer: Self-pay | Admitting: Internal Medicine

## 2019-04-21 ENCOUNTER — Other Ambulatory Visit: Payer: Self-pay | Admitting: Internal Medicine

## 2019-04-21 DIAGNOSIS — H33312 Horseshoe tear of retina without detachment, left eye: Secondary | ICD-10-CM | POA: Diagnosis not present

## 2019-04-21 MED ORDER — METOPROLOL TARTRATE 50 MG PO TABS: 50.0000 mg | ORAL_TABLET | Freq: Two times a day (BID) | ORAL | 1 refills | Status: DC

## 2019-04-22 DIAGNOSIS — H33321 Round hole, right eye: Secondary | ICD-10-CM | POA: Diagnosis not present

## 2019-05-05 DIAGNOSIS — H18413 Arcus senilis, bilateral: Secondary | ICD-10-CM | POA: Diagnosis not present

## 2019-05-05 DIAGNOSIS — H25041 Posterior subcapsular polar age-related cataract, right eye: Secondary | ICD-10-CM | POA: Diagnosis not present

## 2019-05-05 DIAGNOSIS — H2511 Age-related nuclear cataract, right eye: Secondary | ICD-10-CM | POA: Diagnosis not present

## 2019-05-05 DIAGNOSIS — Z961 Presence of intraocular lens: Secondary | ICD-10-CM | POA: Diagnosis not present

## 2019-05-17 DIAGNOSIS — H2511 Age-related nuclear cataract, right eye: Secondary | ICD-10-CM | POA: Diagnosis not present

## 2019-05-17 DIAGNOSIS — H33312 Horseshoe tear of retina without detachment, left eye: Secondary | ICD-10-CM | POA: Diagnosis not present

## 2019-05-17 DIAGNOSIS — H33321 Round hole, right eye: Secondary | ICD-10-CM | POA: Diagnosis not present

## 2019-05-17 DIAGNOSIS — H35411 Lattice degeneration of retina, right eye: Secondary | ICD-10-CM | POA: Diagnosis not present

## 2019-05-18 DIAGNOSIS — H2511 Age-related nuclear cataract, right eye: Secondary | ICD-10-CM | POA: Diagnosis not present

## 2019-05-22 ENCOUNTER — Other Ambulatory Visit: Payer: Self-pay | Admitting: Internal Medicine

## 2019-05-27 ENCOUNTER — Telehealth: Payer: Self-pay | Admitting: Internal Medicine

## 2019-05-27 NOTE — Telephone Encounter (Signed)
LM for pt, lab orders are in and pt can schedule appt in [facility] office or walk in to [facility] lab. Bone Density order is in for [place].   We have BP and weight from May 2020 CPE.    Reason for visit: Request an Appointment  Comments: I need blood work and a bone density test.  I also want my blood pressure and weight for my insurance screening.  My blood work will provide me my cholesterol numbers I will need.

## 2019-06-01 ENCOUNTER — Encounter: Payer: Self-pay | Admitting: Internal Medicine

## 2019-06-01 ENCOUNTER — Other Ambulatory Visit: Payer: Self-pay

## 2019-06-01 ENCOUNTER — Ambulatory Visit
Admission: RE | Admit: 2019-06-01 | Discharge: 2019-06-01 | Disposition: A | Discharge status: Home / Self Care | Payer: BC Managed Care – PPO | Source: Ambulatory Visit | Attending: Internal Medicine | Admitting: Internal Medicine

## 2019-06-01 DIAGNOSIS — Z13820 Encounter for screening for osteoporosis: Secondary | ICD-10-CM | POA: Diagnosis not present

## 2019-06-01 DIAGNOSIS — Z780 Asymptomatic menopausal state: Secondary | ICD-10-CM | POA: Diagnosis not present

## 2019-06-01 DIAGNOSIS — I10 Essential (primary) hypertension: Secondary | ICD-10-CM

## 2019-06-01 MED ORDER — HYDROCHLOROTHIAZIDE 25 MG PO TABS: 25.0000 mg | ORAL_TABLET | Freq: Every day | ORAL | 1 refills | Status: DC

## 2019-06-22 DIAGNOSIS — H43813 Vitreous degeneration, bilateral: Secondary | ICD-10-CM | POA: Diagnosis not present

## 2019-06-22 DIAGNOSIS — H33311 Horseshoe tear of retina without detachment, right eye: Secondary | ICD-10-CM | POA: Diagnosis not present

## 2019-06-22 DIAGNOSIS — H35413 Lattice degeneration of retina, bilateral: Secondary | ICD-10-CM | POA: Diagnosis not present

## 2019-06-24 ENCOUNTER — Other Ambulatory Visit (INDEPENDENT_AMBULATORY_CARE_PROVIDER_SITE_OTHER): Payer: BC Managed Care – PPO

## 2019-06-24 ENCOUNTER — Other Ambulatory Visit: Payer: Self-pay

## 2019-06-24 DIAGNOSIS — I10 Essential (primary) hypertension: Secondary | ICD-10-CM

## 2019-06-24 DIAGNOSIS — H33311 Horseshoe tear of retina without detachment, right eye: Secondary | ICD-10-CM | POA: Diagnosis not present

## 2019-06-24 LAB — BASIC METABOLIC PANEL
BUN: 15 mg/dL (ref 6–23)
CO2: 29 mEq/L (ref 19–32)
Calcium: 9.2 mg/dL (ref 8.4–10.5)
Chloride: 100 mEq/L (ref 96–112)
Creatinine, Ser: 0.82 mg/dL (ref 0.40–1.20)
GFR: 70.53 mL/min (ref >60.00)
Glucose, Bld: 85 mg/dL (ref 70–99)
Potassium: 4.1 mEq/L (ref 3.5–5.1)
Sodium: 138 mEq/L (ref 135–145)

## 2019-06-24 MED ORDER — HYDROCHLOROTHIAZIDE 25 MG PO TABS: 25.0000 mg | ORAL_TABLET | Freq: Every day | ORAL | 6 refills | Status: DC

## 2019-06-24 NOTE — Addendum Note (Signed)
Addended byDamita Dunnings D on: 06/24/2019 03:47 PM   Modules accepted: Orders

## 2019-06-29 DIAGNOSIS — Z1501 Genetic susceptibility to malignant neoplasm of breast: Secondary | ICD-10-CM | POA: Diagnosis not present

## 2019-06-29 DIAGNOSIS — N1330 Unspecified hydronephrosis: Secondary | ICD-10-CM | POA: Diagnosis not present

## 2019-07-13 DIAGNOSIS — Z9013 Acquired absence of bilateral breasts and nipples: Secondary | ICD-10-CM | POA: Diagnosis not present

## 2019-07-13 DIAGNOSIS — Z4889 Encounter for other specified surgical aftercare: Secondary | ICD-10-CM | POA: Diagnosis not present

## 2019-07-13 DIAGNOSIS — Z1501 Genetic susceptibility to malignant neoplasm of breast: Secondary | ICD-10-CM | POA: Diagnosis not present

## 2019-07-13 DIAGNOSIS — Z79899 Other long term (current) drug therapy: Secondary | ICD-10-CM | POA: Diagnosis not present

## 2019-07-13 DIAGNOSIS — Z23 Encounter for immunization: Secondary | ICD-10-CM | POA: Diagnosis not present

## 2019-07-13 DIAGNOSIS — Z9889 Other specified postprocedural states: Secondary | ICD-10-CM | POA: Diagnosis not present

## 2019-07-25 DIAGNOSIS — I10 Essential (primary) hypertension: Secondary | ICD-10-CM | POA: Diagnosis not present

## 2019-07-25 DIAGNOSIS — Z9013 Acquired absence of bilateral breasts and nipples: Secondary | ICD-10-CM | POA: Diagnosis not present

## 2019-07-25 DIAGNOSIS — Z1501 Genetic susceptibility to malignant neoplasm of breast: Secondary | ICD-10-CM | POA: Diagnosis not present

## 2019-07-25 DIAGNOSIS — Z20828 Contact with and (suspected) exposure to other viral communicable diseases: Secondary | ICD-10-CM | POA: Diagnosis not present

## 2019-07-25 DIAGNOSIS — Z421 Encounter for breast reconstruction following mastectomy: Secondary | ICD-10-CM | POA: Diagnosis not present

## 2019-07-25 DIAGNOSIS — Z882 Allergy status to sulfonamides status: Secondary | ICD-10-CM | POA: Diagnosis not present

## 2019-07-25 DIAGNOSIS — Z1509 Genetic susceptibility to other malignant neoplasm: Secondary | ICD-10-CM | POA: Diagnosis not present

## 2019-07-25 DIAGNOSIS — G8918 Other acute postprocedural pain: Secondary | ICD-10-CM | POA: Diagnosis not present

## 2019-08-09 DIAGNOSIS — Z1509 Genetic susceptibility to other malignant neoplasm: Secondary | ICD-10-CM | POA: Diagnosis not present

## 2019-08-09 DIAGNOSIS — Z1501 Genetic susceptibility to malignant neoplasm of breast: Secondary | ICD-10-CM | POA: Diagnosis not present

## 2019-08-09 DIAGNOSIS — Z9889 Other specified postprocedural states: Secondary | ICD-10-CM | POA: Diagnosis not present

## 2019-08-09 DIAGNOSIS — Z9013 Acquired absence of bilateral breasts and nipples: Secondary | ICD-10-CM | POA: Diagnosis not present

## 2019-08-20 ENCOUNTER — Other Ambulatory Visit: Payer: Self-pay | Admitting: Internal Medicine

## 2019-08-22 DIAGNOSIS — Z9889 Other specified postprocedural states: Secondary | ICD-10-CM | POA: Diagnosis not present

## 2019-08-22 DIAGNOSIS — Z1501 Genetic susceptibility to malignant neoplasm of breast: Secondary | ICD-10-CM | POA: Diagnosis not present

## 2019-08-22 DIAGNOSIS — Z1509 Genetic susceptibility to other malignant neoplasm: Secondary | ICD-10-CM | POA: Diagnosis not present

## 2019-08-22 DIAGNOSIS — Z9013 Acquired absence of bilateral breasts and nipples: Secondary | ICD-10-CM | POA: Diagnosis not present

## 2019-09-02 DIAGNOSIS — Z9013 Acquired absence of bilateral breasts and nipples: Secondary | ICD-10-CM | POA: Diagnosis not present

## 2019-09-02 DIAGNOSIS — Z1509 Genetic susceptibility to other malignant neoplasm: Secondary | ICD-10-CM | POA: Diagnosis not present

## 2019-09-02 DIAGNOSIS — Z9889 Other specified postprocedural states: Secondary | ICD-10-CM | POA: Diagnosis not present

## 2019-09-02 DIAGNOSIS — Z1501 Genetic susceptibility to malignant neoplasm of breast: Secondary | ICD-10-CM | POA: Diagnosis not present

## 2019-09-30 ENCOUNTER — Other Ambulatory Visit: Payer: Self-pay | Admitting: Internal Medicine

## 2019-10-04 DIAGNOSIS — Z961 Presence of intraocular lens: Secondary | ICD-10-CM | POA: Diagnosis not present

## 2019-10-12 ENCOUNTER — Ambulatory Visit

## 2019-10-12 ENCOUNTER — Ambulatory Visit: Admitting: Neurological Surgery

## 2019-10-12 ENCOUNTER — Ambulatory Visit: Admit: 2019-10-12 | Payer: Medicare Other

## 2019-10-12 NOTE — Progress Notes (Signed)
 .  Progress Notes  .  Patient: [name]  Provider: Glendell Docker    .  DOB:1956-02-27 Age: 63 Y Sex: Female  .  PCP: Stacey Drain MD  Date: 10/12/2019  .  --------------------------------------------------------------------------------  .  REASON FOR APPOINTMENT  .  1. Lower back pain and left leg pain  .  HISTORY OF PRESENT ILLNESS  .  Associated Providers:  Primary Care Provider  Danru Norma Fredrickson, MD.  .  NEUROSURGERY:  63 yo F s/p L4-5 TLIF in 2016 (last  appointment 12/2017). She presents today with significant lower  back pain. The symptoms are present with bending at the waist.  Getting up into her truck also worsens her symptoms. This past  summer the patient discovered seaglass at the beach. She reports  that she would spend up to 5 hours a day at the beach walking  looking for seaglass. The patient believes that bending at the  waist looking for seaglass worsened the symptoms. She has noted  that her neck has started to be painful. She has been taking  advil for her pain. She has not had recent physical therapy.She  presents today with Xrays to rule out hardware failure, change in  graft position, or pseudoarthrosis.  .  Ambulatory Falls and Injury Prevention:  HPI  .  Have you experienced a fall in the past year?No , Is the patient  using assistive devices such as a cane or walker?No , Do you need  assistance with ambulation while at our facility?No ,  Interventionsnone, patient not a fall risk  .  Marland Kitchen  CURRENT MEDICATIONS  .  Taking Advil  Taking Mupirocin Ointment 1 application in nostril as needed for  MRSA flareup Externally as directed  Taking Nystatin 100000 UNIT/GM Ointment 1 application to affected  area as needed Externally  Taking Omeprazole 40 MG Capsule Delayed Release 1 capsule. two  days on, one day off Orally as directed  Taking Seroquel 100 MG Tablet 1 tablet Orally Once a day  Taking Tums 500 MG Tablet Chewable 1 tablet as needed for GI  upset Orally Four times a day  Taking  Vitamin D3 2000 UNIT Tablet 1 tablet Orally Once a day  Taking Zoloft 100 MG Tablet 1.5 tablet in morning Orally Once a  day  Not-Taking/PRN Citalopram Hydrobromide 20 MG Tablet 1 tablet  Orally Once a day  Not-Taking/PRN Iron 325 (65 Fe) MG Tablet 1 tablet Orally Once a  day  Not-Taking/PRN Tylenol 325 MG Tablet 2 tablets as needed Orally  every 6 hrs  Medication List reviewed and reconciled with the patient  .  PAST MEDICAL HISTORY  .  Kidney Stones  Chronic Depression  Anxiety  Post Tramatic Stress Disorder  MRSA left nostril (flares up occasionally)  .  ALLERGIES  .  sulfa  penicillin  latex  dark dye  .  SURGICAL HISTORY  .  Appendectomy  Percutaneous diskectomy L4-5  L4-5 laminectomy and TLIF with bilateral L4-5 pedicle screw  fixation 10/02/2015  .  FAMILY HISTORY  .  Mother: deceased  Father: deceased  5 sister(s) .  Marland Kitchen  SOCIAL HISTORY  .  .  Tobaccohistory:Never smoked.  .  Work/Occupation: unemployed, SSDI.  Marland Kitchen  Abuse/NeglectDo you feel unsafe in your relationships?No , Have  you ever been hit, kicked, punched or otherwise hurt by someone  in the past year? No.  .  HOSPITALIZATION/MAJOR DIAGNOSTIC PROCEDURE  .  see surgeries  .  VITAL SIGNS  .  Pain scale 10, Ht-in 5'1"Patient has lower back pain. Pain level  is at a 10 when bending down.  Marland Kitchen  PHYSICAL EXAMINATION  .  NEUROSURGERY:  MOTOR Lower Extremities  : 5/5 strength in the bilateral lower  extremities. :5/5 strength in the bilateral lower extremities.  SENSATION  : Abnormal slightly less sensation to the right foot.  :Abnormal slightly less sensation to the right foot. Reflexes   Left Knee Jerk 2+, Right Knee Jerk 2+, Left Ankle Jerk 1+, Right  Ankle Jerk 1+. Left Knee Jerk2+ , Right Knee Jerk2+ , Left Ankle  Jerk1+ , Right Ankle Jerk1+. Gait  : WNL. :WNL.  DIAGNOSTIC STUDIES:  RADIOLOGY  I reviewed Xray lumbar spine ap/lateral, flexion,  extension performed on 10/12/2019 at [facility].  .  ASSESSMENTS  .  Low back pain - M54.5 (Primary)  .  Other chronic  pain - G89.29  .  Imp: Low back pain related to her exertion but no evidence of  pathology on her x-rays and no evidence of radiculopathy. I  recommended therapy and exercise.  .  FOLLOW UP  .  prn  .  Electronically signed by Glendell Docker , MD on  10/12/2019 at 02:34 PM EST  .  Document electronically signed by Glendell Docker    .

## 2019-10-12 NOTE — Progress Notes (Signed)
 * * *    [name], [name] **DOB:** [birth year] (63 yo F) **Acc No.** [id number] **DOS:**  10/12/2019    ---       [name]**    ------    17 Y old Female, DOB: 13-Dec-1955, External MRN: [id number]    Account Number: [id number]    [address], [place], [id number]    Home: [phone]    Insurance: MEDICARE    PCP: Stacey Drain, MD Referring: Stacey Drain, MD    Appointment Facility: Neurosurgery        * * *    10/12/2019 Progress Notes: Laurie Docker, MD **CHN#:** [id number]    ------    ---       **Reason for Appointment**    ---      1\. Lower back pain and left leg pain    ---      **History of Present Illness**    ---     _Associated Providers_ :    Primary Care Provider Danru Norma Fredrickson, MD.    _NEUROSURGERY_ :    64 yo F s/p L4-5 TLIF in 2016 (last appointment 12/2017). She presents today  with significant lower back pain. The symptoms are present with bending at the  waist. Getting up into her truck also worsens her symptoms. This past summer  the patient discovered seaglass at the beach. She reports that she would spend  up to 5 hours a day at the beach walking looking for seaglass. The patient  believes that bending at the waist looking for seaglass worsened the symptoms.  She has noted that her neck has started to be painful.    She has been taking advil for her pain. She has not had recent physical  therapy.    She presents today with Xrays to rule out hardware failure, change in graft  position, or pseudoarthrosis.     _Ambulatory Falls and Injury Prevention_ :    HPI Have you experienced a fall in the past year? No, Is the patient using  assistive devices such as a cane or walker? No, Do you need assistance with  ambulation while at our facility? No, Interventions none, patient not a fall  risk .     **Current Medications**    ---    Taking    * Advil     ---    * Mupirocin Ointment 1 application in nostril as needed for MRSA flareup Externally as  directed    ---    * Nystatin 100000 UNIT/GM Ointment 1 application to affected area as needed Externally     ---    * Omeprazole 40 MG Capsule Delayed Release 1 capsule. two days on, one day off Orally as directed    ---    * Seroquel 100 MG Tablet 1 tablet Orally Once a day    ---    * Tums 500 MG Tablet Chewable 1 tablet as needed for GI upset Orally Four times a day    ---    * Vitamin D3 2000 UNIT Tablet 1 tablet Orally Once a day    ---    * Zoloft 100 MG Tablet 1.5 tablet in morning Orally Once a day    ---    Not-Taking/PRN    * Citalopram Hydrobromide 20 MG Tablet 1 tablet Orally Once a day    ---    * Iron 325 (65 Fe) MG Tablet 1 tablet Orally Once  a day    ---    * Tylenol 325 MG Tablet 2 tablets as needed Orally every 6 hrs    ---    * Medication List reviewed and reconciled with the patient    ---      **Past Medical History**    ---      Kidney Stones .        ---    Chronic Depression .        ---    Anxiety.        ---    Post Tramatic Stress Disorder .        ---    MRSA left nostril (flares up occasionally).        ---      **Surgical History**    ---      Appendectomy    ---    Percutaneous diskectomy L4-5    ---    L4-5 laminectomy and TLIF with bilateral L4-5 pedicle screw fixation 10/02/2015    ---      **Family History**    ---      Mother: deceased    ---    Father: deceased    ---    5 sister(s) .    ---     **Social History**    ---    Tobacco  history: Never smoked.    Work/Occupation: unemployed, Sport and exercise psychologist.    Abuse/Neglect  Do you feel unsafe in your relationships? No, Have you ever  been hit, kicked, punched or otherwise hurt by someone in the past year? No.     **Allergies**    ---      sulfa    ---    penicillin    ---    latex    ---    dark dye    ---     **Hospitalization/Major Diagnostic Procedure**    ---      see surgeries    ---     **Vital Signs**    ---    Pain scale 10, Ht-in 5'1"    Patient has lower back pain. Pain level is at a 10 when bending down.      **Physical  Examination**    ---     _NEUROSURGERY_ :    MOTOR Lower Extremities : 5/5 strength in the bilateral lower extremities.  SENSATION : Abnormal slightly less sensation to the right foot. Reflexes Left  Knee Jerk 2+, Right Knee Jerk 2+, Left Ankle Jerk 1+, Right Ankle Jerk 1+.  Gait : WNL.    _DIAGNOSTIC STUDIES_ :    RADIOLOGY I reviewed Xray lumbar spine ap/lateral, flexion, extension  performed on 10/12/2019 at [facility].         **Assessments**    ---    1\. Low back pain - M54.5 (Primary)    ---    2\. Other chronic pain - G89.29    ---     Imp: Low back pain related to her exertion but no evidence of pathology on  her x-rays and no evidence of radiculopathy. I recommended therapy and  exercise.    ---      **Follow Up**    ---    prn    Electronically signed by [name] , MD on 10/12/2019 at 02:34 PM EST    Sign off status: Completed        * * *        Neurosurgery    [address] [place], [address]    [place], [place] [address]    Tel: [phone]    Fax: [phone]              * * *          Progress Note: Laurie Docker, MD 10/12/2019    ---    Note generated by eClinicalWorks EMR/PM Software ([email or web address])

## 2019-11-03 DIAGNOSIS — Z87898 Personal history of other specified conditions: Secondary | ICD-10-CM | POA: Diagnosis not present

## 2019-11-03 DIAGNOSIS — Z9013 Acquired absence of bilateral breasts and nipples: Secondary | ICD-10-CM | POA: Diagnosis not present

## 2019-11-03 DIAGNOSIS — Z09 Encounter for follow-up examination after completed treatment for conditions other than malignant neoplasm: Secondary | ICD-10-CM | POA: Diagnosis not present

## 2019-11-03 DIAGNOSIS — Z1509 Genetic susceptibility to other malignant neoplasm: Secondary | ICD-10-CM | POA: Diagnosis not present

## 2019-11-03 DIAGNOSIS — Z9889 Other specified postprocedural states: Secondary | ICD-10-CM | POA: Diagnosis not present

## 2019-11-03 DIAGNOSIS — Z1501 Genetic susceptibility to malignant neoplasm of breast: Secondary | ICD-10-CM | POA: Diagnosis not present

## 2019-11-15 DIAGNOSIS — H31093 Other chorioretinal scars, bilateral: Secondary | ICD-10-CM | POA: Diagnosis not present

## 2019-11-15 DIAGNOSIS — H43813 Vitreous degeneration, bilateral: Secondary | ICD-10-CM | POA: Diagnosis not present

## 2019-11-15 DIAGNOSIS — H43391 Other vitreous opacities, right eye: Secondary | ICD-10-CM | POA: Diagnosis not present

## 2019-11-19 ENCOUNTER — Other Ambulatory Visit: Payer: Self-pay | Admitting: Internal Medicine

## 2019-11-23 DIAGNOSIS — L570 Actinic keratosis: Secondary | ICD-10-CM | POA: Diagnosis not present

## 2019-11-23 DIAGNOSIS — D229 Melanocytic nevi, unspecified: Secondary | ICD-10-CM | POA: Diagnosis not present

## 2019-11-23 DIAGNOSIS — L821 Other seborrheic keratosis: Secondary | ICD-10-CM | POA: Diagnosis not present

## 2019-11-23 DIAGNOSIS — D1801 Hemangioma of skin and subcutaneous tissue: Secondary | ICD-10-CM | POA: Diagnosis not present

## 2019-12-12 ENCOUNTER — Encounter: Payer: Self-pay | Admitting: Internal Medicine

## 2019-12-13 ENCOUNTER — Encounter: Payer: Self-pay | Admitting: Internal Medicine

## 2019-12-13 ENCOUNTER — Ambulatory Visit (INDEPENDENT_AMBULATORY_CARE_PROVIDER_SITE_OTHER): Payer: BC Managed Care – PPO | Admitting: Internal Medicine

## 2019-12-13 ENCOUNTER — Other Ambulatory Visit: Payer: Self-pay

## 2019-12-13 VITALS — BP 107/93 | HR 90 | Temp 96.3°F | Resp 16 | Ht 68.0 in | Wt 184.1 lb

## 2019-12-13 DIAGNOSIS — Z1501 Genetic susceptibility to malignant neoplasm of breast: Secondary | ICD-10-CM

## 2019-12-13 DIAGNOSIS — Z1509 Genetic susceptibility to other malignant neoplasm: Secondary | ICD-10-CM

## 2019-12-13 DIAGNOSIS — F329 Major depressive disorder, single episode, unspecified: Secondary | ICD-10-CM

## 2019-12-13 DIAGNOSIS — F32A Depression, unspecified: Secondary | ICD-10-CM

## 2019-12-13 DIAGNOSIS — F419 Anxiety disorder, unspecified: Secondary | ICD-10-CM

## 2019-12-13 DIAGNOSIS — I10 Essential (primary) hypertension: Secondary | ICD-10-CM | POA: Diagnosis not present

## 2019-12-13 MED ORDER — BUPROPION HCL ER (XL) 150 MG PO TB24: 150.0000 mg | ORAL_TABLET | Freq: Every day | ORAL | 1 refills | Status: DC

## 2019-12-13 MED ORDER — CYCLOBENZAPRINE HCL 5 MG PO TABS: 5.0000 mg | ORAL_TABLET | Freq: Every evening | ORAL | 0 refills | Status: DC | PRN

## 2019-12-13 NOTE — Progress Notes (Signed)
Pre visit review using our clinic review tool, if applicable. No additional management support is needed unless otherwise documented below in the visit note. 

## 2019-12-13 NOTE — Patient Instructions (Signed)
   GO TO THE FRONT DESK Come back for a physical by 02/2020  , please make an appointment

## 2019-12-13 NOTE — Progress Notes (Signed)
Subjective:    Patient ID: Sheila Fuller, female    DOB: May 10, 1956, 64 y.o.   MRN: [id number]  DOS:  12/13/2019 Type of visit - description: Acute Chief complaint today is depression, anxiety. Since Covid started, she has been somewhat isolated from friends and family. Also she and her husband and spending more time together at home and sometimes she admits she gets "snappy". Symptoms somewhat increased after she spoke with her family 2 weeks ago regards the 2-year anniversary of the loss of her brother.   Review of Systems Denies suicidal ideas or violent ideas Sleeping well She remains physically active  Past Medical History:  Diagnosis Date  . Allergic rhinitis   . Allergy   . Anxiety   . Arthritis   . Asthma    seasonally  . BRCA1 positive   . Cancer ([place])    skin  . 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   . Cataract    will remove left end of 10-2016  . Class 1 obesity   . Depression   . Family history of BRCA gene mutation    daughter BRCA1 +  . Family history of breast cancer   . Family history of pancreatic cancer   . GERD (gastroesophageal reflux disease)    takes zantac maybe 2/week  . Hyperlipidemia   . Hypertension   . Lactose intolerance 10/04/2013  . Microhematuria 01/2006   saw Dr.Peterson, renal u/s: r pyelocaliectasis but CT was neg, cysto neg  . Shortness of breath    only with asthma flare up  . Sleep apnea    rarely uses cpap    Past Surgical History:  Procedure Laterality Date  . ANTERIOR CERVICAL DECOMP/DISCECTOMY FUSION N/A 08/23/2014   Procedure: C5-6 Anterior Cervical Discectomy and Fusion, Allograft, Plate;  Surgeon: Marybelle Killings, MD;  Location: [facility];  Service: Orthopedics;  Laterality: N/A;  . BREAST SURGERY  09/2018   BRCA-1 gene positive, +reconstruction  . cervical cauterization    . CESAREAN SECTION    . GANGLION CYST EXCISION    . NASAL SEPTUM SURGERY    . POLYPECTOMY    .  ROBOTIC ASSISTED BILATERAL SALPINGO OOPHERECTOMY Bilateral 11/24/2017   Procedure: XI ROBOTIC ASSISTED BILATERAL SALPINGO OOPHORECTOMY;  Surgeon: Everitt Amber, MD;  Location: [facility];  Service: Gynecology;  Laterality: Bilateral;    Allergies as of 12/13/2019      Reactions   Amlodipine Swelling, Other (See Comments)   Edema/swelling in legs.   Statins    REACTION: myalgia   Sulfonamide Derivatives Rash      Medication List       Accurate as of December 13, 2019  3:37 PM. If you have any questions, ask your nurse or doctor.        albuterol 108 (90 Base) MCG/ACT inhaler Commonly known as: ProAir HFA Inhale 2 puffs into the lungs every 4 (four) hours as needed for wheezing or shortness of breath.   azelastine 0.1 % nasal spray Commonly known as: ASTELIN Place 2 sprays into both nostrils at bedtime. Use in each nostril as directed   cyclobenzaprine 5 MG tablet Commonly known as: FLEXERIL Take 1 tablet (5 mg total) at bedtime as needed by mouth for muscle spasms.   desloratadine 5 MG tablet Commonly known as: CLARINEX Take 5 mg by mouth daily as needed (for allergies.).   EMERGEN-C IMMUNE PLUS PO Take by mouth.  hydrochlorothiazide 25 MG tablet Commonly known as: HYDRODIURIL Take 1 tablet (25 mg total) by mouth daily.   losartan 100 MG tablet Commonly known as: COZAAR Take 1 tablet (100 mg total) by mouth daily.   Lubricant Eye Drops 0.4-0.3 % Soln Generic drug: Polyethyl Glycol-Propyl Glycol Place 1-2 drops into both eyes 3 (three) times daily as needed (for dry/irritated eyes.).   metoprolol tartrate 50 MG tablet Commonly known as: LOPRESSOR Take 1 tablet (50 mg total) by mouth 2 (two) times daily.   naproxen 500 MG tablet Commonly known as: NAPROSYN Take 1 tablet (500 mg total) by mouth daily as needed for moderate pain.   NexIUM 24HR Clear Minis 20 MG capsule Generic drug: esomeprazole Take 20 mg by mouth daily at 12 noon.             Objective:    Physical Exam BP (!) 107/93 (BP Location: Left Arm, Patient Position: Sitting, Cuff Size: Normal)   Pulse 90   Temp (!) 96.3 F (35.7 C) (Temporal)   Resp 16   Ht _0  (1.727 m)   Wt 184 lb 2 oz (83.5 kg)   SpO2 97%   BMI 28.00 kg/m  General:   Well developed, NAD, BMI noted. HEENT:  Normocephalic . Face symmetric, atraumatic Lungs:  CTA B Normal respiratory effort, no intercostal retractions, no accessory muscle use. Heart: RRR,  no murmur.  Lower extremities: no pretibial edema bilaterally  Skin: Not pale. Not jaundice Neurologic:  alert & oriented X3.  Speech normal, gait appropriate for age and unassisted Psych--  Cognition and judgment appear intact.  Cooperative with normal attention span and concentration.  Behavior appropriate. Slightly tearful when we talk about her brother but in general she knows she is somewhat anxious and depressed but is keeping good spirits.      Assessment     Assessment HTN Hyperlipidemia Anxiety, depression GERD Seasonal asthma Allergies Obesity : BMI~ 28 plus HTN, hyperlipidemia, OSA OSA: on Cpap consistently as off 03/2018 MSK: DJD, occasionally uses pain medication H/o Microhematuria 2007, Dr. Terance Hart, renal US showed right pyelocaliectasis, CT was negative, cystoscopy negative + BRCA: personal h/o (dx 2018)  and FH BRCA (daughter, sister).  See office visit 10/14/2017 Bilateral mastectomy 09/2018 H/o Carcinoma in situ, perianal skin, XRT, chemotherapy 2000. Not further follow-ups with specialist  PLAN:  Depression anxiety: As described above, she was doing very well for a long time, current triggers is isolation due to the quarantine and  still grieving the loss of her brother.. We talk about the role of exercise and she is actually very active.  I also encouraged her to seek counseling if she has access to it and she stated that she will do that. We talk about medication, previously fluoxetine worked but increase her weight,  would like to try again Wellbutrin which was successful treating her symptoms years ago. Plan:  Wellbutrin XL 150 mg 1 p.o. daily, Rx sent, she likes brand only. If is working well, then I see her next for her physical by 02/2020 If is not working well, she will send me a note in 3 to 4 weeks and let me know, we may need to do a virtual visit, adjust her wellbutrin dose or add an SSRI. HTN: Good compliance with losartan, metoprolol. + BRCA: She already finished 2 major breast surgeries , she still has a couple of minor surgeries pending. RTC CPX 02/2020   This visit occurred during the SARS-CoV-2 public health emergency.  Safety  protocols were in place, including screening questions prior to the visit, additional usage of staff PPE, and extensive cleaning of exam room while observing appropriate contact time as indicated for disinfecting solutions.  

## 2019-12-15 NOTE — Assessment & Plan Note (Signed)
Depression anxiety: As described above, she was doing very well for a long time, current triggers is isolation due to the quarantine and  still grieving the loss of her brother.. We talk about the role of exercise and she is actually very active.  I also encouraged her to seek counseling if she has access to it and she stated that she will do that. We talk about medication, previously fluoxetine worked but increase her weight, would like to try again Wellbutrin which was successful treating her symptoms years ago. Plan:  Wellbutrin XL 150 mg 1 p.o. daily, Rx sent, she likes brand only. If is working well, then I see her next for her physical by 02/2020 If is not working well, she will send me a note in 3 to 4 weeks and let me know, we may need to do a virtual visit, adjust her wellbutrin dose or add an SSRI. HTN: Good compliance with losartan, metoprolol. + BRCA: She already finished 2 major breast surgeries , she still has a couple of minor surgeries pending. RTC CPX 02/2020

## 2019-12-28 DIAGNOSIS — Z9889 Other specified postprocedural states: Secondary | ICD-10-CM | POA: Diagnosis not present

## 2020-01-05 ENCOUNTER — Other Ambulatory Visit: Payer: Self-pay | Admitting: Internal Medicine

## 2020-01-11 ENCOUNTER — Encounter: Payer: Self-pay | Admitting: Internal Medicine

## 2020-01-11 MED ORDER — METOPROLOL TARTRATE 50 MG PO TABS: 50.0000 mg | ORAL_TABLET | Freq: Two times a day (BID) | ORAL | 1 refills | Status: DC

## 2020-01-19 ENCOUNTER — Encounter: Payer: Self-pay | Admitting: Internal Medicine

## 2020-01-19 MED ORDER — BUPROPION HCL ER (XL) 150 MG PO TB24: 150.0000 mg | ORAL_TABLET | Freq: Every day | ORAL | 1 refills | Status: DC

## 2020-01-20 ENCOUNTER — Telehealth: Payer: Self-pay

## 2020-01-20 ENCOUNTER — Ambulatory Visit: Payer: BC Managed Care – PPO | Attending: Internal Medicine

## 2020-01-20 DIAGNOSIS — Z23 Encounter for immunization: Secondary | ICD-10-CM

## 2020-01-20 NOTE — Progress Notes (Signed)
   Covid-19 Vaccination Clinic  Name:  Sheila Fuller    MRN: [id number] DOB: April 29, 1956  01/20/2020  [name] was observed post Covid-19 immunization for 15 minutes without incident. She was provided with Vaccine Information Sheet and instruction to access the V-Safe system.   [name] was instructed to call 911 with any severe reactions post vaccine: Marland Kitchen Difficulty breathing  . Swelling of face and throat  . A fast heartbeat  . A bad rash all over body  . Dizziness and weakness   Immunizations Administered    Name Date Dose VIS Date Route   Pfizer COVID-19 Vaccine 01/20/2020 12:17 PM 0.3 mL 10/07/2019 Intramuscular   Manufacturer: [place]   Lot: [id number]   [place]: [id number]

## 2020-01-20 NOTE — Telephone Encounter (Signed)
PA form received from [employer] for branded Wellbutrin. Form completed and faxed back to [phone]. Awaiting determination.

## 2020-01-23 NOTE — Telephone Encounter (Signed)
PA approved. Effective 12/22/2019 to 01/20/2021.

## 2020-02-15 ENCOUNTER — Ambulatory Visit

## 2020-02-15 ENCOUNTER — Ambulatory Visit: Admitting: Neurological Surgery

## 2020-02-15 ENCOUNTER — Ambulatory Visit: Payer: BC Managed Care – PPO | Attending: Internal Medicine

## 2020-02-15 ENCOUNTER — Ambulatory Visit: Admit: 2020-02-15 | Payer: Medicare Other

## 2020-02-15 DIAGNOSIS — Z23 Encounter for immunization: Secondary | ICD-10-CM

## 2020-02-15 NOTE — Progress Notes (Signed)
   Covid-19 Vaccination Clinic  Name:  [name]    MRN: [id number] DOB: [birth year]  02/15/2020  [name] was observed post Covid-19 immunization for 15 minutes without incident. She was provided with Vaccine Information Sheet and instruction to access the V-Safe system.   [name] was instructed to call 911 with any severe reactions post vaccine: Marland Kitchen Difficulty breathing  . Swelling of face and throat  . A fast heartbeat  . A bad rash all over body  . Dizziness and weakness   Immunizations Administered    Name Date Dose VIS Date Route   Pfizer COVID-19 Vaccine 02/15/2020 12:00 PM 0.3 mL 12/21/2018 Intramuscular   Manufacturer: [place]   Lot: [id number]   [place]: [id number]

## 2020-02-15 NOTE — Progress Notes (Signed)
COVID-19 Vaccine Information can be found at: [email or web address] For questions related to vaccine distribution or appointments, please email [email or web address] or call [phone].    

## 2020-02-15 NOTE — Progress Notes (Signed)
 * * *    [name], [name] **DOB:** 1955/12/01 (64 yo F) **Acc No.** [id number] **DOS:**  02/15/2020    ---       [name]**    ------    [name], DOB: September 14, 1956, External MRN: [id number]    Account Number: [id number]    [address], [place], [place]    Home: [phone]    Insurance: MEDICARE    PCP: Laurie Drain, MD Referring: Laurie Drain, MD    Appointment Facility: Neurosurgery        * * *    02/15/2020 Progress Notes: Laurie Docker, MD **CHN#:** [id number]    ------    ---       **Reason for Appointment**    ---      1\. PHONE VISIT    ---      **History of Present Illness**    ---     _Associated Providers_ :    The patient encounter today occurred via telehealth (telemedicine) with the  patient's verbal consent.    The reason for the telehealth visit was due to the COVID-19 pandemic crisis /  federally declared state of public health emergency, and need for social  distancing. The patient denies having any known contacts with people diagnosed  with COVID-19 or any signs of active infection with COVID-19. The patient is  reasonably trying to maintain social distancing measures.    .    Method of Telehealth used was a real-time, interactive, secure and private  audio .    .    .    Physical location of the patient was at home.    Marland Kitchen    Physical location of the provider was in the hospital.    .    Names of all persons participating in the Telehealth service (roles) other  than the patient and attending physician: Laurie Ginger NP.    Primary Care Provider Laurie Norma Fredrickson, MD.    _NEUROSURGERY_ :    64 yo F in telehealth follow-up (last appointment 10/12/2019). She is s/p L4-5  TLIF in 2016. At her most recent appointment, she reported significant lower  back pain. The symptoms are present with bending at the waist. Getting up into  her truck also worsens her symptoms. STanding up from bending forward makes  pain work. She complains of neck and mid  back pain.    She has been taking advil for her pain. She has not had recent physical  therapy.     _Ambulatory Falls and Injury Prevention_ :    HPI Have you experienced a fall in the past year? No, Is the patient using  assistive devices such as a cane or walker? No, Do you need assistance with  ambulation while at our facility? No, Interventions none, patient not a fall  risk .     **Current Medications**    ---    Taking    * Advil     ---    * Mupirocin Ointment 1 application in nostril as needed for MRSA flareup Externally as directed    ---    * Nystatin 100000 UNIT/GM Ointment 1 application to affected area as needed Externally     ---    * Omeprazole 40 MG Capsule Delayed Release 1 capsule. two days on, one day off Orally as directed    ---    * Seroquel 100 MG Tablet 1 tablet Orally Once  a day    ---    * Tums 500 MG Tablet Chewable 1 tablet as needed for GI upset Orally Four times a day    ---    * Vitamin D3 2000 UNIT Tablet 1 tablet Orally Once a day    ---    * Zoloft 100 MG Tablet 1.5 tablet in morning Orally Once a day    ---    Not-Taking/PRN    * Citalopram Hydrobromide 20 MG Tablet 1 tablet Orally Once a day    ---    * Iron 325 (65 Fe) MG Tablet 1 tablet Orally Once a day    ---    * Tylenol 325 MG Tablet 2 tablets as needed Orally every 6 hrs    ---    * Medication List reviewed and reconciled with the patient    ---      **Past Medical History**    ---      Kidney Stones .        ---    Chronic Depression .        ---    Anxiety.        ---    Post Tramatic Stress Disorder .        ---    MRSA left nostril (flares up occasionally).        ---      **Surgical History**    ---      Appendectomy    ---    Percutaneous diskectomy L4-5    ---    L4-5 laminectomy and TLIF with bilateral L4-5 pedicle screw fixation 10/02/2015    ---      **Family History**    ---      Mother: deceased    ---    Father: deceased    ---    5 sister(s) .    ---     **Social History**    ---    Tobacco  history:  Never smoked.    Work/Occupation: unemployed, Sport and exercise psychologist.    Abuse/Neglect  Do you feel unsafe in your relationships? No, Have you ever  been hit, kicked, punched or otherwise hurt by someone in the past year? No.     **Allergies**    ---      sulfa    ---    penicillin    ---    latex    ---    dark dye    ---     **Hospitalization/Major Diagnostic Procedure**    ---      see surgeries    ---     **Physical Examination**    ---     _DIAGNOSTIC STUDIES_ :    RADIOLOGY I reviewed Xray lumbar spine ap/lateral, flexion, extension  performed on 10/12/2019 at [facility].         **Assessments**    ---    1\. Low back pain - M54.5 (Primary)    ---    2\. Other chronic pain - G89.29    ---     Imp: Low back pain related to her exertion but no evidence of pathology on  her x-rays and no evidence of radiculopathy. I recommended therapy and  exercise. She has pain in her entire spine (neck, thoracic and lumbar spine).  I would like CT lumbar spine (without contrast). followup in clinic.    total time 12 minutes.    ---      **Procedure Codes**    ---  [id number] PHONE E/M BY PHYS 11-20 MIN    ---      **Follow Up**    ---    in person visit with CT lumbar spine    Electronically signed by [name] , MD on 02/16/2020 at 11:57 AM EDT    Sign off status: Completed        * * *        Neurosurgery    [address] [place]    [place], [place] [address]    Tel: [phone]    Fax: [phone]              * * *          Progress Note: Laurie Docker, MD 02/15/2020    ---    Note generated by eClinicalWorks EMR/PM Software ([email or web address])

## 2020-02-15 NOTE — Progress Notes (Signed)
.  Progress Notes  .  Patient: [name]  Provider: Glendell Docker    .  DOB:1956/08/26 Age: 64 Y Sex: Female  .  PCP: Stacey Drain MD  Date: 02/15/2020  .  --------------------------------------------------------------------------------  .  REASON FOR APPOINTMENT  .  1. PHONE VISIT  .  HISTORY OF PRESENT ILLNESS  .  Associated Providers:  The patient encounter today  occurred via telehealth (telemedicine) with the patient's verbal  consent.The reason for the telehealth visit was due to the  COVID-19 pandemic crisis / federally declared state of public  health emergency, and need for social distancing. The patient  denies having any known contacts with people diagnosed with  COVID-19 or any signs of active infection with COVID-19. The  patient is reasonably trying to maintain social distancing  measures..Method of Telehealth used was a real-time, interactive,  secure and private audio .Marland KitchenMarland KitchenPhysical location of the patient was  at home.Marland KitchenPhysical location of the provider was in the  hospital..Names of all persons participating in the Telehealth  service (roles) other than the patient and attending physician:  Secundino Ginger NP.  Primary Care Provider  Danru Norma Fredrickson, MD.  .  NEUROSURGERY:  64 yo F in telehealth follow-up  (last appointment 10/12/2019). She is s/p L4-5 TLIF in 2016. At  her most recent appointment, she reported significant lower back  pain. The symptoms are present with bending at the waist. Getting  up into her truck also worsens her symptoms. STanding up from  bending forward makes pain work. She complains of neck and mid  back pain. She has been taking advil for her pain. She has not  had recent physical therapy.  .  Ambulatory Falls and Injury Prevention:  HPI  .  Have you experienced a fall in the past year?No , Is the patient  using assistive devices such as a cane or walker?No , Do you need  assistance with ambulation while at our facility?No ,  Interventionsnone, patient not a fall  risk  .  Marland Kitchen  CURRENT MEDICATIONS  .  Taking Advil  Taking Mupirocin Ointment 1 application in nostril as needed for  MRSA flareup Externally as directed  Taking Nystatin 100000 UNIT/GM Ointment 1 application to affected  area as needed Externally  Taking Omeprazole 40 MG Capsule Delayed Release 1 capsule. two  days on, one day off Orally as directed  Taking Seroquel 100 MG Tablet 1 tablet Orally Once a day  Taking Tums 500 MG Tablet Chewable 1 tablet as needed for GI  upset Orally Four times a day  Taking Vitamin D3 2000 UNIT Tablet 1 tablet Orally Once a day  Taking Zoloft 100 MG Tablet 1.5 tablet in morning Orally Once a  day  Not-Taking/PRN Citalopram Hydrobromide 20 MG Tablet 1 tablet  Orally Once a day  Not-Taking/PRN Iron 325 (65 Fe) MG Tablet 1 tablet Orally Once a  day  Not-Taking/PRN Tylenol 325 MG Tablet 2 tablets as needed Orally  every 6 hrs  Medication List reviewed and reconciled with the patient  .  PAST MEDICAL HISTORY  .  Kidney Stones  Chronic Depression  Anxiety  Post Tramatic Stress Disorder  MRSA left nostril (flares up occasionally)  .  ALLERGIES  .  sulfa  penicillin  latex  dark dye  .  SURGICAL HISTORY  .  Appendectomy  Percutaneous diskectomy L4-5  L4-5 laminectomy and TLIF with bilateral L4-5 pedicle screw  fixation 10/02/2015  .  FAMILY HISTORY  .  Mother: deceased  Father: deceased  5 sister(s) .  Marland Kitchen  SOCIAL HISTORY  .  .  Tobaccohistory:Never smoked.  .  Work/Occupation: unemployed, SSDI.  Marland Kitchen  Abuse/NeglectDo you feel unsafe in your relationships?No , Have  you ever been hit, kicked, punched or otherwise hurt by someone  in the past year? No.  .  HOSPITALIZATION/MAJOR DIAGNOSTIC PROCEDURE  .  see surgeries  .  PHYSICAL EXAMINATION  .  DIAGNOSTIC STUDIES:  RADIOLOGY  I reviewed Xray lumbar spine ap/lateral, flexion,  extension performed on 10/12/2019 at [facility].  .  ASSESSMENTS  .  Low back pain - M54.5 (Primary)  .  Other chronic pain - G89.29  .  Imp: Low back pain related to her exertion  but no evidence of  pathology on her x-rays and no evidence of radiculopathy. I  recommended therapy and exercise. She has pain in her entire  spine (neck, thoracic and lumbar spine). I would like CT lumbar  spine (without contrast). followup in clinic.total time 12  minutes.  Marland Kitchen  PROCEDURE CODES  .  [id number] PHONE E/M BY PHYS 11-20 MIN  .  FOLLOW UP  .  in person visit with CT lumbar spine  .  Electronically signed by Glendell Docker , MD on  02/16/2020 at 11:57 AM EDT  .  Document electronically signed by Glendell Docker    .

## 2020-02-21 ENCOUNTER — Encounter: Payer: Self-pay | Admitting: Internal Medicine

## 2020-02-21 MED ORDER — BUPROPION HCL ER (XL) 150 MG PO TB24: 150.0000 mg | ORAL_TABLET | Freq: Every day | ORAL | 0 refills | Status: DC

## 2020-02-24 ENCOUNTER — Encounter: Payer: Self-pay | Admitting: Internal Medicine

## 2020-02-24 MED ORDER — HYDROCHLOROTHIAZIDE 25 MG PO TABS: 25.0000 mg | ORAL_TABLET | Freq: Every day | ORAL | 6 refills | Status: DC

## 2020-03-12 ENCOUNTER — Encounter: Payer: BC Managed Care – PPO | Admitting: Internal Medicine

## 2020-03-14 ENCOUNTER — Other Ambulatory Visit: Payer: Self-pay

## 2020-03-14 ENCOUNTER — Encounter: Payer: Self-pay | Admitting: Internal Medicine

## 2020-03-14 ENCOUNTER — Ambulatory Visit (INDEPENDENT_AMBULATORY_CARE_PROVIDER_SITE_OTHER): Payer: BC Managed Care – PPO | Admitting: Internal Medicine

## 2020-03-14 VITALS — BP 136/90 | HR 76 | Temp 96.6°F | Resp 18 | Ht 68.0 in | Wt 188.4 lb

## 2020-03-14 DIAGNOSIS — N390 Urinary tract infection, site not specified: Secondary | ICD-10-CM | POA: Diagnosis not present

## 2020-03-14 DIAGNOSIS — Z0000 Encounter for general adult medical examination without abnormal findings: Secondary | ICD-10-CM | POA: Diagnosis not present

## 2020-03-14 DIAGNOSIS — G4733 Obstructive sleep apnea (adult) (pediatric): Secondary | ICD-10-CM

## 2020-03-14 DIAGNOSIS — E785 Hyperlipidemia, unspecified: Secondary | ICD-10-CM | POA: Diagnosis not present

## 2020-03-14 DIAGNOSIS — Z09 Encounter for follow-up examination after completed treatment for conditions other than malignant neoplasm: Secondary | ICD-10-CM

## 2020-03-14 LAB — URINALYSIS, ROUTINE W REFLEX MICROSCOPIC
Bilirubin Urine: NEGATIVE
Ketones, ur: NEGATIVE
Nitrite: POSITIVE — AB
Specific Gravity, Urine: 1.02 (ref 1.000–1.030)
Total Protein, Urine: NEGATIVE
Urine Glucose: NEGATIVE
Urobilinogen, UA: 0.2 (ref 0.0–1.0)
pH: 6 (ref 5.0–8.0)

## 2020-03-14 LAB — COMPREHENSIVE METABOLIC PANEL
ALT: 18 U/L (ref 0–35)
AST: 14 U/L (ref 0–37)
Albumin: 4.3 g/dL (ref 3.5–5.2)
Alkaline Phosphatase: 80 U/L (ref 39–117)
BUN: 18 mg/dL (ref 6–23)
CO2: 28 mEq/L (ref 19–32)
Calcium: 9.3 mg/dL (ref 8.4–10.5)
Chloride: 102 mEq/L (ref 96–112)
Creatinine, Ser: 0.88 mg/dL (ref 0.40–1.20)
GFR: 64.86 mL/min (ref >60.00)
Glucose, Bld: 110 mg/dL — ABNORMAL HIGH (ref 70–99)
Potassium: 4.1 mEq/L (ref 3.5–5.1)
Sodium: 140 mEq/L (ref 135–145)
Total Bilirubin: 0.5 mg/dL (ref 0.2–1.2)
Total Protein: 6.8 g/dL (ref 6.0–8.3)

## 2020-03-14 LAB — CBC WITH DIFFERENTIAL/PLATELET
Basophils Absolute: 0 10*3/uL (ref 0.0–0.1)
Basophils Relative: 0.7 % (ref 0.0–3.0)
Eosinophils Absolute: 0.2 10*3/uL (ref 0.0–0.7)
Eosinophils Relative: 2.7 % (ref 0.0–5.0)
HCT: 42 % (ref 36.0–46.0)
Hemoglobin: 14.3 g/dL (ref 12.0–15.0)
Lymphocytes Relative: 26.5 % (ref 12.0–46.0)
Lymphs Abs: 1.8 10*3/uL (ref 0.7–4.0)
MCHC: 34.2 g/dL (ref 30.0–36.0)
MCV: 89.6 fl (ref 78.0–100.0)
Monocytes Absolute: 0.4 10*3/uL (ref 0.1–1.0)
Monocytes Relative: 6.4 % (ref 3.0–12.0)
Neutro Abs: 4.3 10*3/uL (ref 1.4–7.7)
Neutrophils Relative %: 63.7 % (ref 43.0–77.0)
Platelets: 239 10*3/uL (ref 150.0–400.0)
RBC: 4.69 Mil/uL (ref 3.87–5.11)
RDW: 13.9 % (ref 11.5–15.5)
WBC: 6.8 10*3/uL (ref 4.0–10.5)

## 2020-03-14 LAB — LIPID PANEL
Cholesterol: 303 mg/dL — ABNORMAL HIGH (ref 0–200)
HDL: 50.4 mg/dL (ref >39.00)
LDL Cholesterol: 218 mg/dL — ABNORMAL HIGH (ref 0–99)
NonHDL: 252.44
Total CHOL/HDL Ratio: 6
Triglycerides: 174 mg/dL — ABNORMAL HIGH (ref 0.0–149.0)
VLDL: 34.8 mg/dL (ref 0.0–40.0)

## 2020-03-14 LAB — TSH: TSH: 2.65 u[IU]/mL (ref 0.35–4.50)

## 2020-03-14 NOTE — Patient Instructions (Signed)
Continue checking your blood pressures BP GOAL is between 110/65 and  135/85. If it is consistently higher or lower, let me know  Continue taking vitamin D daily  GO TO THE LAB : Get the blood work     [place], [place] back for a checkup in 6 months

## 2020-03-14 NOTE — Progress Notes (Signed)
cPre visit review using our clinic review tool, if applicable. No additional management support is needed unless otherwise documented below in the visit note.

## 2020-03-14 NOTE — Progress Notes (Addendum)
Subjective:    Patient ID: Sheila Fuller, female    DOB: 1956/02/19, 64 y.o.   MRN: [id number]  DOS:  03/14/2020 Type of visit - description: CPX In general feeling well.  Review of Systems Denies any major problems except excessive flatus.  Other than above, a 14 point review of systems is negative    Past Medical History:  Diagnosis Date  . Allergic rhinitis   . Allergy   . Anxiety   . Arthritis   . Asthma    seasonally  . BRCA1 positive   . Cancer ([place])    skin  . 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   . Cataract    will remove left end of 10-2016  . Class 1 obesity   . Depression   . Family history of BRCA gene mutation    daughter BRCA1 +  . Family history of breast cancer   . Family history of pancreatic cancer   . GERD (gastroesophageal reflux disease)    takes zantac maybe 2/week  . Hyperlipidemia   . Hypertension   . Lactose intolerance 10/04/2013  . Microhematuria 01/2006   saw Dr.Peterson, renal u/s: r pyelocaliectasis but CT was neg, cysto neg  . Shortness of breath    only with asthma flare up  . Sleep apnea    rarely uses cpap    Past Surgical History:  Procedure Laterality Date  . ANTERIOR CERVICAL DECOMP/DISCECTOMY FUSION N/A 08/23/2014   Procedure: C5-6 Anterior Cervical Discectomy and Fusion, Allograft, Plate;  Surgeon: Marybelle Killings, MD;  Location: [facility];  Service: Orthopedics;  Laterality: N/A;  . BREAST SURGERY  09/2018   BRCA-1 gene positive, +reconstruction  . cervical cauterization    . CESAREAN SECTION    . GANGLION CYST EXCISION    . MASTECTOMY Bilateral ~ 1/22020  . NASAL SEPTUM SURGERY    . POLYPECTOMY    . ROBOTIC ASSISTED BILATERAL SALPINGO OOPHERECTOMY Bilateral 11/24/2017   Procedure: XI ROBOTIC ASSISTED BILATERAL SALPINGO OOPHORECTOMY;  Surgeon: Everitt Amber, MD;  Location: [facility];  Service: Gynecology;  Laterality: Bilateral;   Family History  Problem Relation Age of Onset   . Lung cancer Father 65       hx smoking  . Diabetes Mother        late onset  . Hyperlipidemia Mother   . Cancer Mother 79       metholosmia  . Cervical cancer Sister 39  . Cancer Maternal Aunt 60       colon/rectal  . Cancer Paternal Uncle        lung- hx smoking , died 24's  . BRCA 1/2 Daughter        had prophlactic bilat mastectomy  . Esophageal cancer Brother 79  . Breast cancer Paternal Aunt 62  . Breast cancer Paternal Aunt 11  . Lung cancer Paternal Uncle   . Pancreatic cancer Paternal Uncle 73  . Cancer Cousin        type unk, dx <50  . Coronary artery disease Neg Hx   . Hypertension Neg Hx   . Stroke Neg Hx   . Sudden death Neg Hx   . Heart attack Neg Hx   . Colon polyps Neg Hx   . Rectal cancer Neg Hx   . Stomach cancer Neg Hx     Allergies as of 03/14/2020      Reactions   Amlodipine  Swelling, Other (See Comments)   Edema/swelling in legs.   Statins    REACTION: myalgia   Sulfonamide Derivatives Rash      Medication List       Accurate as of Mar 14, 2020 11:59 PM. If you have any questions, ask your nurse or doctor.        albuterol 108 (90 Base) MCG/ACT inhaler Commonly known as: ProAir HFA Inhale 2 puffs into the lungs every 4 (four) hours as needed for wheezing or shortness of breath.   azelastine 0.1 % nasal spray Commonly known as: ASTELIN Place 2 sprays into both nostrils at bedtime. Use in each nostril as directed   buPROPion 150 MG 24 hr tablet Commonly known as: WELLBUTRIN XL Take 1 tablet (150 mg total) by mouth daily.   ciprofloxacin 500 MG tablet Commonly known as: Cipro Take 1 tablet (500 mg total) by mouth 2 (two) times daily. Started by: Kathlene November, MD   cyclobenzaprine 5 MG tablet Commonly known as: FLEXERIL Take 1 tablet (5 mg total) by mouth at bedtime as needed for muscle spasms.   desloratadine 5 MG tablet Commonly known as: CLARINEX Take 5 mg by mouth daily as needed (for allergies.).   EMERGEN-C IMMUNE PLUS  PO Take by mouth.   ezetimibe 10 MG tablet Commonly known as: Zetia Take 1 tablet (10 mg total) by mouth daily. Started by: Kathlene November, MD   hydrochlorothiazide 25 MG tablet Commonly known as: HYDRODIURIL Take 1 tablet (25 mg total) by mouth daily.   losartan 100 MG tablet Commonly known as: COZAAR TAKE 1 TABLET DAILY   Lubricant Eye Drops 0.4-0.3 % Soln Generic drug: Polyethyl Glycol-Propyl Glycol Place 1-2 drops into both eyes 3 (three) times daily as needed (for dry/irritated eyes.).   metoprolol tartrate 50 MG tablet Commonly known as: LOPRESSOR Take 1 tablet (50 mg total) by mouth daily. What changed: when to take this Changed by: Kathlene November, MD   naproxen 500 MG tablet Commonly known as: NAPROSYN Take 1 tablet (500 mg total) by mouth daily as needed for moderate pain.   NexIUM 24HR Clear Minis 20 MG capsule Generic drug: esomeprazole Take 20 mg by mouth daily at 12 noon.          Objective:   Physical Exam BP 136/90 (BP Location: Left Arm, Patient Position: Sitting, Cuff Size: Small)   Pulse 76   Temp (!) 96.6 F (35.9 C) (Temporal)   Resp 18   Ht '5\' 8"'  (1.727 m)   Wt 188 lb 6 oz (85.4 kg)   SpO2 97%   BMI 28.64 kg/m  General: Well developed, NAD, BMI noted Neck: No  thyromegaly  HEENT:  Normocephalic . Face symmetric, atraumatic Lungs:  CTA B Normal respiratory effort, no intercostal retractions, no accessory muscle use. Heart: RRR,  no murmur.  Abdomen:  Not distended, soft, non-tender. No rebound or rigidity.   Lower extremities: no pretibial edema bilaterally  Skin: Exposed areas without rash. Not pale. Not jaundice Neurologic:  alert & oriented X3.  Speech normal, gait appropriate for age and unassisted Strength symmetric and appropriate for age.  Psych: Cognition and judgment appear intact.  Cooperative with normal attention span and concentration.  Behavior appropriate. No anxious or depressed appearing.     Assessment        Assessment HTN Hyperlipidemia (statin intolerant) Anxiety, depression GERD Seasonal asthma Allergies Obesity : BMI~ 28 plus HTN, hyperlipidemia, OSA OSA: on Cpap consistently as off 03/2018 MSK: DJD, occasionally uses  pain medication H/o Microhematuria 2007, Dr. Terance Hart, renal US showed right pyelocaliectasis, CT was negative, cystoscopy negative + BRCA: personal h/o (dx 2018)  and FH BRCA (daughter, sister).  See office visit 10/14/2017 Bilateral mastectomy 09/2018 H/o Carcinoma in situ, perianal skin, XRT, chemotherapy 2000. Not further follow-ups with specialist  PLAN:  For CPX HTN: Self decrease metoprolol to 1 tablet in the morning because after the p.m. dose she was feeling really cold.  BP remains controlled.  No change, continue metoprolol once daily only, losartan and HCTZ Hyperlipidemia: Diet controlled, statin intolerant, check labs. Anxiety depression: Currently well controlled OSA: Using the CPAP consistently lately, likes to see pulmonary for assessment, referral sent. UTI?  Request a UA urine culture sent, she has been found to have asymptomatic UTIs. RTC 6 months   This visit occurred during the SARS-CoV-2 public health emergency.  Safety protocols were in place, including screening questions prior to the visit, additional usage of staff PPE, and extensive cleaning of exam room while observing appropriate contact time as indicated for disinfecting solutions.

## 2020-03-15 ENCOUNTER — Encounter: Payer: Self-pay | Admitting: Internal Medicine

## 2020-03-15 ENCOUNTER — Telehealth: Payer: Self-pay

## 2020-03-15 ENCOUNTER — Telehealth: Payer: Self-pay | Admitting: Internal Medicine

## 2020-03-15 MED ORDER — EZETIMIBE 10 MG PO TABS
10.0000 mg | ORAL_TABLET | Freq: Every day | ORAL | 6 refills | Status: DC
Start: 2020-03-15 — End: 2020-11-30

## 2020-03-15 MED ORDER — CIPROFLOXACIN HCL 500 MG PO TABS
500.0000 mg | ORAL_TABLET | Freq: Two times a day (BID) | ORAL | 0 refills | Status: DC
Start: 2020-03-15 — End: 2020-06-13

## 2020-03-15 NOTE — Telephone Encounter (Signed)
PA initiated via Covermymeds; KEY: [id number]. PA approved.   CaseId:[id number];Status:Approved;Review Type:Prior Auth;Coverage Start Date:02/14/2020;Coverage End Date:03/15/2021

## 2020-03-15 NOTE — Assessment & Plan Note (Signed)
-  Td 10/2018 - Pneumonia shot 2014; prevnar 2016 -  Shingrix completed 2020 -Covid vaccine completed -CCS: Cscopes 2003 , 2007 (Tubullovillous polyps) , 2012, and 09-2016  , next cscope 2022 per GI letter  - female care per gyn: S/p oophorectomy 2018  Last PAP 2017, was rec no further PAPs breast ca screening: s/p B  Mastectomy, d/t BRCA , no further MMG - Dexa 12-2006 ,  11-11,-2020: Normal; h/o early menopause. - former smoker: See previous comments, declines CT chest for lung cancer screening.  Reportedly had negative genetic testing for lung cancer -Labs: CMP, FLP, CBC, TSH, UA, urine culture -Diet and exercise  : discussed

## 2020-03-15 NOTE — Assessment & Plan Note (Addendum)
For CPX HTN: Self decrease metoprolol to 1 tablet in the morning because after the p.m. dose she was feeling really cold.  BP remains controlled.  No change, continue metoprolol once daily only, losartan and HCTZ  Hyperlipidemia: Diet controlled, statin intolerant, check labs. Anxiety depression: Currently well controlled OSA: Using the CPAP consistently lately, likes to see pulmonary for assessment, referral sent. UTI?  Request a UA urine culture sent, she has been found to have asymptomatic UTIs. RTC 6 months

## 2020-03-15 NOTE — Telephone Encounter (Signed)
Done

## 2020-03-15 NOTE — Telephone Encounter (Signed)
-----   Message from [place], [place] sent at 03/15/2020 12:57 PM EDT ----- Regarding: Lab appt Needs fasting lab appt in 6 weeks please.

## 2020-03-16 LAB — URINE CULTURE
MICRO NUMBER:: 10495937
SPECIMEN QUALITY:: ADEQUATE

## 2020-03-28 ENCOUNTER — Ambulatory Visit (HOSPITAL_BASED_OUTPATIENT_CLINIC_OR_DEPARTMENT_OTHER): Admitting: Psychiatry

## 2020-03-28 ENCOUNTER — Ambulatory Visit: Admitting: Neurological Surgery

## 2020-03-28 ENCOUNTER — Ambulatory Visit: Admit: 2020-03-28 | Payer: Medicaid Other

## 2020-03-28 NOTE — Progress Notes (Signed)
 .  Progress Notes  .  Patient: [name]  Provider: Glendell Docker    .  DOB:Jun 20, 1956 Age: 64 Y Sex: Female  .  PCP: Stacey Drain MD  Date: 03/28/2020  .  --------------------------------------------------------------------------------  .  REASON FOR APPOINTMENT  .  1. New CT scan  .  2. New cervical and thoracic xrays  .  HISTORY OF PRESENT ILLNESS  .  NEUROSURGERY:  64 yo F in follow-up (last  appointment via telehealth on 02/15/2020). She is s/p L4-5 TLIF in  2016. At her most recent appointment, she reported significant  lower back pain. She had spent a lot of time at the beach looking  for sea glass (4-5 days per week, up to 6 hours per day) and  since then she has worsening low back pain. The symptoms are  present with bending at the waist. Getting up into her truck also  worsens her symptoms. Standing up from bending forward makes pain  work. She complains of neck and mid back pain. She reports a  "stabbing" pain in the low back up to the mid back to the neck. A  lumbar CT scan with contrast and cervical and thoracic xrays were  ordered to evaluate further. These will be reviewed with the  patient.She has been taking advil for her pain. She underwent a  course of physical therapy for 4 months several months ago  without relief.  .  CURRENT MEDICATIONS  .  Taking Advil 200 MG Tablet 2-3 tablets with food or milk as  needed for pain Orally Three times a day  Taking Mupirocin Ointment 1 application in nostril as needed for  MRSA flareup Externally as directed  Taking Nystatin 100000 UNIT/GM Ointment 1 application to affected  area as needed Externally  Taking Omeprazole 40 MG Capsule Delayed Release 1 capsule. two  days on, one day off Orally as directed  Taking Seroquel 50 MG Tablet 1 tablet at bedtime Orally Once a  day  Taking Tums 500 MG Tablet Chewable 1 tablet as needed for GI  upset Orally Four times a day  Taking Vitamin D3 2000 UNIT Tablet 1 tablet Orally Once a day  Taking Zoloft 100 MG  Tablet 1.5 tablet in morning Orally Once a  day  Medication List reviewed and reconciled with the patient  .  PAST MEDICAL HISTORY  .  Kidney Stones  Chronic Depression  Anxiety  Post Tramatic Stress Disorder  MRSA left nostril (flares up occasionally)  .  ALLERGIES  .  sulfa  penicillin  latex  dark dye  .  SURGICAL HISTORY  .  Appendectomy  Percutaneous diskectomy L4-5  L4-5 laminectomy and TLIF with bilateral L4-5 pedicle screw  fixation 10/02/2015  .  FAMILY HISTORY  .  Mother: deceased  Father: deceased  5 sister(s) .  Marland Kitchen  SOCIAL HISTORY  .  .  Tobaccohistory:Never smoked.  .  Work/Occupation: unemployed, SSDI.  Marland Kitchen  Abuse/NeglectDo you feel unsafe in your relationships?No , Have  you ever been hit, kicked, punched or otherwise hurt by someone  in the past year? No.  .  HOSPITALIZATION/MAJOR DIAGNOSTIC PROCEDURE  .  see surgeries  .  VITAL SIGNS  .  Pain scale 10, Ht-in 5'1", Wt-lbs 134, BMI 25.32.  Marland Kitchen  PHYSICAL EXAMINATION  .  NEUROSURGERY:  MOTOR Upper Extremities  : 5/5 strength in the bilateral upper  extremities. :5/5 strength in the bilateral upper extremities .  MOTOR Lower Extremities  : 5/5  strength in the bilateral lower  extremities. :5/5 strength in the bilateral lower extremities.  Reflexes  Left Bicep 1+, Right Bicep 1+, Left Brachioradialis 1+,  Right Brachioradialis 1+, Left Triceps 1+, Right Triceps 1+, Left  Knee Jerk 1+, Right Knee Jerk 1+, Left Ankle Jerk Absent, Right  Ankle Jerk Absent. Left Bicep1+ , Right Bicep1+ , Left  Brachioradialis1+ , Right Brachioradialis1+ , Left Triceps1+ ,  Right Triceps1+ , Left Knee Jerk1+ , Right Knee Jerk1+ , Left  Ankle JerkAbsent , Right Ankle JerkAbsent. Gait  : WNL. :WNL.  Hoffman's Sign  : Negative. :Negative. Babinski  : Negative.  :Negative. Ankle Clonus  : Negative. :Negative.  DIAGNOSTIC STUDIES:  RADIOLOGY  I reviewed Xray lumbar spine ap/lateral, flexion,  extension performed on 10/12/2019 at [facility]. I reviewed cervical  and thoracic xrays completed  03/28/2020 at [facility]. I reviewed lumbar  CT completed without contrast on 03/28/2020 [facility]  .  ASSESSMENTS  .  Low back pain - M54.5 (Primary)  .  Low back pain related to her exertion but no evidence of  pathology on her x-rays and no evidence of radiculopathy. I  recommended therapy and exercise. She has pain in her entire  spine (neck, thoracic and lumbar spine). CT and Xrays show  satisfactory hardware position. There is no neuroforaminal  narrowing on the CT scan.  .  FOLLOW UP  .  prn  .  Electronically signed by Glendell Docker , MD on  03/28/2020 at 10:19 AM EDT  .  Document electronically signed by Glendell Docker    .

## 2020-03-28 NOTE — Progress Notes (Signed)
 * * *    Downs, Laurie **DOB:** Jun 21, 1956 (64 yo F) **Acc No.** [id number] **DOS:**  03/28/2020    ---       [name]**    ------    64 Y old Female, DOB: Jan 11, 1956, External MRN: [id number]    Account Number: [id number]    [address], [place], [id number]    Home: [phone]    Insurance: MEDICARE    PCP: Stacey Drain, MD Referring: Stacey Drain, MD    Appointment Facility: Neurosurgery        * * *    03/28/2020 Progress Notes: Glendell Docker, MD **CHN#:** [id number]    ------    ---       **Reason for Appointment**    ---      1\. New CT scan    ---    2\. New cervical and thoracic xrays    ---      **History of Present Illness**    ---     _NEUROSURGERY_ :    64 yo F in follow-up (last appointment via telehealth on 02/15/2020). She is  s/p L4-5 TLIF in 2016. At her most recent appointment, she reported  significant lower back pain. She had spent a lot of time at the beach looking  for sea glass (4-5 days per week, up to 6 hours per day) and since then she  has worsening low back pain. The symptoms are present with bending at the  waist. Getting up into her truck also worsens her symptoms. Standing up from  bending forward makes pain work. She complains of neck and mid back pain. She  reports a "stabbing" pain in the low back up to the mid back to the neck. A  lumbar CT scan with contrast and cervical and thoracic xrays were ordered to  evaluate further. These will be reviewed with the patient.    She has been taking advil for her pain. She underwent a course of physical  therapy for 4 months several months ago without relief.      **Current Medications**    ---    Taking    * Advil 200 MG Tablet 2-3 tablets with food or milk as needed for pain Orally Three times a day    ---    * Mupirocin Ointment 1 application in nostril as needed for MRSA flareup Externally as directed    ---    * Nystatin 100000 UNIT/GM Ointment 1 application to affected area as needed  Externally     ---    * Omeprazole 40 MG Capsule Delayed Release 1 capsule. two days on, one day off Orally as directed    ---    * Seroquel 50 MG Tablet 1 tablet at bedtime Orally Once a day    ---    * Tums 500 MG Tablet Chewable 1 tablet as needed for GI upset Orally Four times a day    ---    * Vitamin D3 2000 UNIT Tablet 1 tablet Orally Once a day    ---    * Zoloft 100 MG Tablet 1.5 tablet in morning Orally Once a day    ---    * Medication List reviewed and reconciled with the patient    ---      **Past Medical History**    ---      Kidney Stones .        ---  Chronic Depression .        ---    Anxiety.        ---    Post Tramatic Stress Disorder .        ---    MRSA left nostril (flares up occasionally).        ---      **Surgical History**    ---      Appendectomy    ---    Percutaneous diskectomy L4-5    ---    L4-5 laminectomy and TLIF with bilateral L4-5 pedicle screw fixation 10/02/2015    ---      **Family History**    ---      Mother: deceased    ---    Father: deceased    ---    5 sister(s) .    ---     **Social History**    ---    Tobacco  history: Never smoked.    Work/Occupation: unemployed, Sport and exercise psychologist.    Abuse/Neglect  Do you feel unsafe in your relationships? No, Have you ever  been hit, kicked, punched or otherwise hurt by someone in the past year? No.     **Allergies**    ---      sulfa    ---    penicillin    ---    latex    ---    dark dye    ---     **Hospitalization/Major Diagnostic Procedure**    ---      see surgeries    ---     **Vital Signs**    ---    Pain scale 10, Ht-in 5'1", Wt-lbs 134, BMI 25.32.      **Physical Examination**    ---     _NEUROSURGERY_ :    MOTOR Upper Extremities : 5/5 strength in the bilateral upper extremities .  MOTOR Lower Extremities : 5/5 strength in the bilateral lower extremities.  Reflexes Left Bicep 1+, Right Bicep 1+, Left Brachioradialis 1+, Right  Brachioradialis 1+, Left Triceps 1+, Right Triceps 1+, Left Knee Jerk 1+,  Right Knee Jerk 1+,  Left Ankle Jerk Absent, Right Ankle Jerk Absent. Gait :  WNL. Hoffman's Sign : Negative. Babinski : Negative. Ankle Clonus : Negative.    _DIAGNOSTIC STUDIES_ :    RADIOLOGY I reviewed Xray lumbar spine ap/lateral, flexion, extension  performed on 10/12/2019 at [facility]. I reviewed cervical and thoracic xrays  completed 03/28/2020 at [facility]. I reviewed lumbar CT completed without contrast  on 03/28/2020 at [facility]         **Assessments**    ---    1\. Low back pain - M54.5 (Primary)    ---     Low back pain related to her exertion but no evidence of pathology on her  x-rays and no evidence of radiculopathy. I recommended therapy and exercise.  She has pain in her entire spine (neck, thoracic and lumbar spine). CT and  Xrays show satisfactory hardware position. There is no neuroforaminal  narrowing on the CT scan.    ---      **Follow Up**    ---    prn    Electronically signed by Glendell Docker , MD on 03/28/2020 at 10:19 AM EDT    Sign off status: Completed        * * *        Neurosurgery    [address]    [facility], [address]    [place], [place]  [address]    Tel: [phone]    Fax: [phone]              * * *          Progress Note: Glendell Docker, MD 03/28/2020    ---    Note generated by eClinicalWorks EMR/PM Software ([email or web address])

## 2020-04-04 ENCOUNTER — Encounter: Payer: Self-pay | Admitting: Internal Medicine

## 2020-04-04 MED ORDER — BUPROPION HCL ER (XL) 150 MG PO TB24: 150.0000 mg | ORAL_TABLET | Freq: Every day | ORAL | 0 refills | Status: DC

## 2020-05-03 ENCOUNTER — Other Ambulatory Visit: Payer: Self-pay

## 2020-05-03 ENCOUNTER — Other Ambulatory Visit (INDEPENDENT_AMBULATORY_CARE_PROVIDER_SITE_OTHER): Payer: BC Managed Care – PPO

## 2020-05-03 ENCOUNTER — Other Ambulatory Visit: Payer: Self-pay | Admitting: Internal Medicine

## 2020-05-03 ENCOUNTER — Encounter: Payer: Self-pay | Admitting: Internal Medicine

## 2020-05-03 DIAGNOSIS — N390 Urinary tract infection, site not specified: Secondary | ICD-10-CM

## 2020-05-03 DIAGNOSIS — E785 Hyperlipidemia, unspecified: Secondary | ICD-10-CM | POA: Diagnosis not present

## 2020-05-03 LAB — URINALYSIS, ROUTINE W REFLEX MICROSCOPIC
Bilirubin Urine: NEGATIVE
Ketones, ur: NEGATIVE
Leukocytes,Ua: NEGATIVE
Nitrite: NEGATIVE
Specific Gravity, Urine: 1.02 (ref 1.000–1.030)
Total Protein, Urine: NEGATIVE
Urine Glucose: NEGATIVE
Urobilinogen, UA: 0.2 (ref 0.0–1.0)
pH: 6 (ref 5.0–8.0)

## 2020-05-03 LAB — LIPID PANEL
Cholesterol: 232 mg/dL — ABNORMAL HIGH (ref 0–200)
HDL: 48.7 mg/dL (ref >39.00)
NonHDL: 183.11
Total CHOL/HDL Ratio: 5
Triglycerides: 237 mg/dL — ABNORMAL HIGH (ref 0.0–149.0)
VLDL: 47.4 mg/dL — ABNORMAL HIGH (ref 0.0–40.0)

## 2020-05-03 LAB — ALT: ALT: 23 U/L (ref 0–35)

## 2020-05-03 LAB — AST: AST: 17 U/L (ref 0–37)

## 2020-05-03 LAB — LDL CHOLESTEROL, DIRECT: Direct LDL: 156 mg/dL

## 2020-05-04 LAB — URINE CULTURE
MICRO NUMBER:: 10680662
Result:: NO GROWTH
SPECIMEN QUALITY:: ADEQUATE

## 2020-05-07 ENCOUNTER — Encounter: Payer: Self-pay | Admitting: Internal Medicine

## 2020-05-22 ENCOUNTER — Encounter: Payer: Self-pay | Admitting: Internal Medicine

## 2020-06-13 ENCOUNTER — Other Ambulatory Visit: Payer: Self-pay

## 2020-06-13 ENCOUNTER — Ambulatory Visit (INDEPENDENT_AMBULATORY_CARE_PROVIDER_SITE_OTHER): Payer: BC Managed Care – PPO | Admitting: Pulmonary Disease

## 2020-06-13 ENCOUNTER — Encounter: Payer: Self-pay | Admitting: Pulmonary Disease

## 2020-06-13 VITALS — BP 128/86 | HR 81 | Temp 98.2°F | Ht 68.0 in | Wt 195.8 lb

## 2020-06-13 DIAGNOSIS — G4733 Obstructive sleep apnea (adult) (pediatric): Secondary | ICD-10-CM | POA: Diagnosis not present

## 2020-06-13 DIAGNOSIS — Z9989 Dependence on other enabling machines and devices: Secondary | ICD-10-CM | POA: Diagnosis not present

## 2020-06-13 NOTE — Progress Notes (Signed)
Sheila Fuller    [id number]    [birth year]  Primary Care Physician:Paz, Alda Berthold, MD  Referring Physician: Colon Branch, MD [address] [place],  [place] [address]  Chief complaint:   Patient with a history of obstructive sleep apnea  HPI:  Sleep apnea was diagnosed about 2012 Has been on CPAP  Has been compliant in the last 3 to 4 years  She sleeps well, feels well  Usually goes to bed about 9 PM Takes about 20 minutes to fall asleep About 2 awakenings Final wake up time between 5 and 6 AM  Weight is generally stable  Usually wakes up from sleep feeling like she is at a good nights rest  She has hypertension, asthma, hypercholesterolemia  No ongoing significant health problems that is not controlled   Outpatient Encounter Medications as of 06/13/2020  Medication Sig  . albuterol (PROAIR HFA) 108 (90 Base) MCG/ACT inhaler Inhale 2 puffs into the lungs every 4 (four) hours as needed for wheezing or shortness of breath.  Marland Kitchen azelastine (ASTELIN) 0.1 % nasal spray Place 2 sprays into both nostrils at bedtime. Use in each nostril as directed  . buPROPion (WELLBUTRIN XL) 150 MG 24 hr tablet Take 1 tablet (150 mg total) by mouth daily.  . cyclobenzaprine (FLEXERIL) 5 MG tablet Take 1 tablet (5 mg total) by mouth at bedtime as needed for muscle spasms.  Marland Kitchen desloratadine (CLARINEX) 5 MG tablet Take 5 mg by mouth daily as needed (for allergies.).   Marland Kitchen esomeprazole (NEXIUM 24HR CLEAR MINIS) 20 MG capsule Take 20 mg by mouth daily at 12 noon.  . ezetimibe (ZETIA) 10 MG tablet Take 1 tablet (10 mg total) by mouth daily.  . hydrochlorothiazide (HYDRODIURIL) 25 MG tablet Take 1 tablet (25 mg total) by mouth daily.  Marland Kitchen losartan (COZAAR) 100 MG tablet TAKE 1 TABLET DAILY  . metoprolol tartrate (LOPRESSOR) 50 MG tablet Take 1 tablet (50 mg total) by mouth daily.  . naproxen (NAPROSYN) 500 MG tablet Take 1 tablet (500 mg total) by mouth daily as needed for moderate  pain.  . [DISCONTINUED] Multiple Vitamins-Minerals (EMERGEN-C IMMUNE PLUS PO) Take by mouth.  . [DISCONTINUED] ciprofloxacin (CIPRO) 500 MG tablet Take 1 tablet (500 mg total) by mouth 2 (two) times daily.  . [DISCONTINUED] Polyethyl Glycol-Propyl Glycol (LUBRICANT EYE DROPS) 0.4-0.3 % SOLN Place 1-2 drops into both eyes 3 (three) times daily as needed (for dry/irritated eyes.).   No facility-administered encounter medications on file as of 06/13/2020.    Allergies as of 06/13/2020 - Review Complete 06/13/2020  Allergen Reaction Noted  . Amlodipine Swelling and Other (See Comments) 11/10/2017  . Statins  12/22/2008  . Sulfonamide derivatives Rash 05/03/2007    Past Medical History:  Diagnosis Date  . Allergic rhinitis   . Allergy   . Anxiety   . Arthritis   . Asthma    seasonally  . BRCA1 positive   . Cancer ([facility])    skin  . 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   . Cataract    will remove left end of 10-2016  . Class 1 obesity   . Depression   . Family history of BRCA gene mutation    daughter BRCA1 +  . Family history of breast cancer   . Family history of pancreatic cancer   . GERD (gastroesophageal reflux disease)  takes zantac maybe 2/week  . Hyperlipidemia   . Hypertension   . Lactose intolerance 10/04/2013  . Microhematuria 01/2006   saw Dr.Peterson, renal u/s: r pyelocaliectasis but CT was neg, cysto neg  . Shortness of breath    only with asthma flare up  . Sleep apnea    rarely uses cpap    Past Surgical History:  Procedure Laterality Date  . ANTERIOR CERVICAL DECOMP/DISCECTOMY FUSION N/A 08/23/2014   Procedure: C5-6 Anterior Cervical Discectomy and Fusion, Allograft, Plate;  Surgeon: Marybelle Killings, MD;  Location: [facility];  Service: Orthopedics;  Laterality: N/A;  . BREAST SURGERY  09/2018   BRCA-1 gene positive, +reconstruction  . cervical cauterization    . CESAREAN SECTION    . GANGLION CYST EXCISION      . MASTECTOMY Bilateral ~ 1/22020  . NASAL SEPTUM SURGERY    . POLYPECTOMY    . ROBOTIC ASSISTED BILATERAL SALPINGO OOPHERECTOMY Bilateral 11/24/2017   Procedure: XI ROBOTIC ASSISTED BILATERAL SALPINGO OOPHORECTOMY;  Surgeon: Everitt Amber, MD;  Location: [facility];  Service: Gynecology;  Laterality: Bilateral;    Family History  Problem Relation Age of Onset  . Lung cancer Father 49       hx smoking  . Diabetes Mother        late onset  . Hyperlipidemia Mother   . Cancer Mother 53       metholosmia  . Cervical cancer Sister 65  . Cancer Maternal Aunt 60       colon/rectal  . Cancer Paternal Uncle        lung- hx smoking , died 66's  . BRCA 1/2 Daughter        had prophlactic bilat mastectomy  . Esophageal cancer Brother 69  . Breast cancer Paternal Aunt 68  . Breast cancer Paternal Aunt 9  . Lung cancer Paternal Uncle   . Pancreatic cancer Paternal Uncle 73  . Cancer Cousin        type unk, dx <50  . Coronary artery disease Neg Hx   . Hypertension Neg Hx   . Stroke Neg Hx   . Sudden death Neg Hx   . Heart attack Neg Hx   . Colon polyps Neg Hx   . Rectal cancer Neg Hx   . Stomach cancer Neg Hx     Social History   Socioeconomic History  . Marital status: Married    Spouse name: Not on file  . Number of children: 1  . Years of education: Not on file  . Highest education level: Not on file  Occupational History  . 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 Smoking status: Former Smoker    Packs/day: 0.30    Years: 3.00    Pack years: 0.90    Types: Cigarettes  . Smokeless tobacco: Never Used  . Tobacco comment: quit 04-2013 ([facility])  Vaping Use  . Vaping Use: Never used  Substance and Sexual Activity  . Alcohol use: Yes    Alcohol/week: 3.0 standard drinks    Types: 3 Standard drinks or equivalent per week    Comment: a week   . Drug use: No  . Sexual activity: Yes    Birth control/protection: Post-menopausal  Other  Topics Concern  . Not on file  Social History Narrative   remarried 10-11, 1 child   Social Determinants of Health   Financial Resource Strain:   . Difficulty of Paying Living  Expenses:   Food Insecurity:   . Worried About Charity fundraiser in the Last Year:   . Arboriculturist in the Last Year:   Transportation Needs:   . Film/video editor (Medical):   Marland Kitchen Lack of Transportation (Non-Medical):   Physical Activity:   . Days of Exercise per Week:   . Minutes of Exercise per Session:   Stress:   . Feeling of Stress :   Social Connections:   . Frequency of Communication with Friends and Family:   . Frequency of Social Gatherings with Friends and Family:   . Attends Religious Services:   . Active Member of Clubs or Organizations:   . Attends Archivist Meetings:   Marland Kitchen Marital Status:   Intimate Partner Violence:   . Fear of Current or Ex-Partner:   . Emotionally Abused:   Marland Kitchen Physically Abused:   . Sexually Abused:     Review of Systems  Constitutional: Negative for fatigue.  Respiratory: Positive for apnea.   Cardiovascular: Negative.   Gastrointestinal: Negative.   Psychiatric/Behavioral: Positive for sleep disturbance.    Vitals:   06/13/20 1107  BP: 128/86  Pulse: 81  Temp: 98.2 F (36.8 C)  SpO2: 96%     Physical Exam Constitutional:      Appearance: Normal appearance.  HENT:     Head: Normocephalic.     Nose: No congestion.     Mouth/Throat:     Mouth: Mucous membranes are moist.  Eyes:     Pupils: Pupils are equal, round, and reactive to light.  Cardiovascular:     Rate and Rhythm: Normal rate and regular rhythm.     Pulses: Normal pulses.     Heart sounds: No murmur heard.  No friction rub.  Pulmonary:     Effort: Pulmonary effort is normal. No respiratory distress.     Breath sounds: Normal breath sounds. No stridor. No wheezing or rhonchi.  Musculoskeletal:        General: Normal range of motion.     Cervical back: No rigidity  or tenderness.  Skin:    General: Skin is warm.  Neurological:     Mental Status: She is alert.     Cranial Nerves: No cranial nerve deficit.   Epworth about 5 today Data Reviewed: Previous sleep study was reviewed showing moderate obstructive sleep apnea  Assessment:  Moderate obstructive sleep apnea -Significant improvement in symptoms with use of CPAP on a regular basis -No significant daytime fatigue or excessive daytime sleepiness  Overweight -Weight management  Exercises regularly  Do not have compliance data available today We will contact medical supply company  Plan/Recommendations: Continue CPAP on a regular basis  CPAP works well with no issues with the machine itself at present  Encouraged to continue using CPAP on a regular basis  Encouraged to continue getting adequate number of hours of sleep  I will see on a yearly basis  Follow-up appointment in a year from now  Sherrilyn Rist MD [facility] 06/13/2020, 11:43 AM  CC: Colon Branch, MD

## 2020-06-13 NOTE — Patient Instructions (Signed)
Moderate obstructive sleep apnea -Compliant with CPAP use  We do not need to make any changes at the present time  We will contact medical supply company to get a download  We will see you back in about a year  You may call at any time if you are having any concerns  I do feel if you are sleeping well, waking up feeling like about a good nights rest and you are compliant with using the CPAP on a regular basis, there is nothing of concern at present

## 2020-06-15 ENCOUNTER — Encounter: Payer: Self-pay | Admitting: Internal Medicine

## 2020-06-15 MED ORDER — METOPROLOL TARTRATE 50 MG PO TABS: 50.0000 mg | ORAL_TABLET | Freq: Every day | ORAL | 1 refills | Status: DC

## 2020-06-20 ENCOUNTER — Telehealth: Payer: Self-pay | Admitting: Pulmonary Disease

## 2020-06-20 NOTE — Telephone Encounter (Signed)
Compliance reviewed showing 100% compliance average daily use of 8 hours

## 2020-07-05 NOTE — Telephone Encounter (Signed)
Called and spoke with patient to let her know about her CPAP compliance report from Dr. Ander Slade. She expressed understanding. Nothing further needed at this time.

## 2020-07-05 NOTE — Telephone Encounter (Signed)
Pt calling about the cpap compliance that was faxed over from [facility]. Pt can be reached at [phone]

## 2020-07-16 DIAGNOSIS — Z1501 Genetic susceptibility to malignant neoplasm of breast: Secondary | ICD-10-CM | POA: Diagnosis not present

## 2020-07-16 DIAGNOSIS — Z1509 Genetic susceptibility to other malignant neoplasm: Secondary | ICD-10-CM | POA: Diagnosis not present

## 2020-07-16 DIAGNOSIS — Z9889 Other specified postprocedural states: Secondary | ICD-10-CM | POA: Diagnosis not present

## 2020-07-16 DIAGNOSIS — Z9013 Acquired absence of bilateral breasts and nipples: Secondary | ICD-10-CM | POA: Diagnosis not present

## 2020-07-27 ENCOUNTER — Ambulatory Visit: Payer: BC Managed Care – PPO | Admitting: Obstetrics & Gynecology

## 2020-08-15 ENCOUNTER — Other Ambulatory Visit (HOSPITAL_COMMUNITY)
Admission: RE | Admit: 2020-08-15 | Discharge: 2020-08-15 | Disposition: A | Discharge status: Home / Self Care | Payer: BC Managed Care – PPO | Source: Ambulatory Visit | Attending: Obstetrics & Gynecology | Admitting: Obstetrics & Gynecology

## 2020-08-15 ENCOUNTER — Ambulatory Visit (INDEPENDENT_AMBULATORY_CARE_PROVIDER_SITE_OTHER): Payer: BC Managed Care – PPO | Admitting: Obstetrics & Gynecology

## 2020-08-15 ENCOUNTER — Encounter: Payer: Self-pay | Admitting: Obstetrics & Gynecology

## 2020-08-15 ENCOUNTER — Other Ambulatory Visit: Payer: Self-pay

## 2020-08-15 VITALS — BP 120/71 | HR 67 | Ht 68.0 in | Wt 194.0 lb

## 2020-08-15 DIAGNOSIS — Z01419 Encounter for gynecological examination (general) (routine) without abnormal findings: Secondary | ICD-10-CM | POA: Insufficient documentation

## 2020-08-15 NOTE — Progress Notes (Signed)
Pt had a double mastectomy in 2019. Colonoscopy in 2020 Zettie Gootee l Corrisa Gibby, CMA

## 2020-08-15 NOTE — Progress Notes (Signed)
Subjective:     Sheila Fuller is a 64 y.o. female here for a routine exam.  Current complaints:  LMP age 80 yo. Pt is s/p prophylactic bilateral oophorectomy and mastectomy for BRACA 1 gene. She reports axillary sweating but, not other adverse sx.  She is still undergoing breast  reconstruction.    Gynecologic History No LMP recorded. Patient is postmenopausal. Contraception: status post hysterectomy Last Pap: 3 years prev results were: normal Last mammogram: 2019.   Obstetric History OB History  Gravida Para Term Preterm AB Living  2 1 1   1 1   SAB TAB Ectopic Multiple Live Births    1          # Outcome Date GA Lbr Len/2nd Weight Sex Delivery Anes PTL Lv  2 TAB           1 Term      [id number]-[id number]      The following portions of the patient's history were reviewed and updated as appropriate: allergies, current medications, past family history, past medical history, past social history, past surgical history and problem list.  Review of Systems Pertinent items are noted in HPI.    Objective:  BP 120/71   Pulse 67   Ht 5\' 8"  (1.727 m)   Wt 194 lb (88 kg)   BMI 29.50 kg/m  General Appearance:    Alert, cooperative, no distress, appears stated age  Head:    Normocephalic, without obvious abnormality, atraumatic  Eyes:    conjunctiva/corneas clear, EOM's intact, both eyes  Ears:    Normal external ear canals, both ears  Nose:   Nares normal, septum midline, mucosa normal, no drainage    or sinus tenderness  Throat:   Lips, mucosa, and tongue normal; teeth and gums normal  Neck:   Supple, symmetrical, trachea midline, no adenopathy;    thyroid:  no enlargement/tenderness/nodules  Back:     Symmetric, no curvature, ROM normal, no CVA tenderness  Lungs:     respirations unlabored  Chest Wall:    No tenderness or deformity   Heart:    Regular rate and rhythm  Breast Exam:    No tenderness, masses, or nipple abnormality. Scarring noted due to reconstruction. Implants noted.    Abdomen:     Soft, non-tender, bowel sounds active all four quadrants,    no masses, no organomegaly. Well healed incisions from abdominoplasty.    Genitalia:    Normal female without lesion, discharge or tenderness   Cervix noted with no lesions. Uterus small and mobile   Extremities:   Extremities normal, atraumatic, no cyanosis or edema  Pulses:   2+ and symmetric all extremities  Skin:   Skin color, texture, turgor normal, no rashes or lesions    Assessment:    Healthy female exam.   Cervical cancer screening.    Plan:   Full GYN and breast history reviewed.  F/u PAP with hrHPV F/u in 1 year or sooner prn  Damarie Schoolfield L. Harraway-Smith, M.D., Cherlynn June

## 2020-08-16 LAB — CYTOLOGY - PAP
Comment: NEGATIVE
Diagnosis: NEGATIVE
High risk HPV: NEGATIVE

## 2020-09-06 DIAGNOSIS — M5137 Other intervertebral disc degeneration, lumbosacral region: Secondary | ICD-10-CM | POA: Diagnosis not present

## 2020-09-06 DIAGNOSIS — M5136 Other intervertebral disc degeneration, lumbar region: Secondary | ICD-10-CM | POA: Diagnosis not present

## 2020-09-06 DIAGNOSIS — M9903 Segmental and somatic dysfunction of lumbar region: Secondary | ICD-10-CM | POA: Diagnosis not present

## 2020-09-06 DIAGNOSIS — M9905 Segmental and somatic dysfunction of pelvic region: Secondary | ICD-10-CM | POA: Diagnosis not present

## 2020-09-09 ENCOUNTER — Other Ambulatory Visit: Payer: Self-pay

## 2020-09-09 ENCOUNTER — Emergency Department
Admission: EM | Admit: 2020-09-09 | Discharge: 2020-09-09 | Disposition: A | Discharge status: Home / Self Care | Payer: Medicaid Other | Attending: Emergency Medicine | Admitting: Emergency Medicine

## 2020-09-09 ENCOUNTER — Other Ambulatory Visit: Payer: Self-pay | Admitting: Internal Medicine

## 2020-09-09 DIAGNOSIS — R399 Unspecified symptoms and signs involving the genitourinary system: Secondary | ICD-10-CM | POA: Insufficient documentation

## 2020-09-09 DIAGNOSIS — J449 Chronic obstructive pulmonary disease, unspecified: Secondary | ICD-10-CM | POA: Insufficient documentation

## 2020-09-09 DIAGNOSIS — R319 Hematuria, unspecified: Secondary | ICD-10-CM | POA: Insufficient documentation

## 2020-09-09 DIAGNOSIS — Z880 Allergy status to penicillin: Secondary | ICD-10-CM

## 2020-09-09 DIAGNOSIS — Z90710 Acquired absence of both cervix and uterus: Secondary | ICD-10-CM | POA: Insufficient documentation

## 2020-09-09 DIAGNOSIS — R1030 Lower abdominal pain, unspecified: Secondary | ICD-10-CM | POA: Insufficient documentation

## 2020-09-09 DIAGNOSIS — R6883 Chills (without fever): Secondary | ICD-10-CM

## 2020-09-09 DIAGNOSIS — Z9049 Acquired absence of other specified parts of digestive tract: Secondary | ICD-10-CM | POA: Insufficient documentation

## 2020-09-09 DIAGNOSIS — Z7951 Long term (current) use of inhaled steroids: Secondary | ICD-10-CM

## 2020-09-09 DIAGNOSIS — N399 Disorder of urinary system, unspecified: Secondary | ICD-10-CM

## 2020-09-09 DIAGNOSIS — R350 Frequency of micturition: Secondary | ICD-10-CM

## 2020-09-09 LAB — URINALYSIS, MACRO/MICRO
BILIRUBIN: NEGATIVE mg/dL
GLUCOSE: NEGATIVE mg/dL
KETONES: NEGATIVE mg/dL
LEUKOCYTES: NEGATIVE WBCs/uL
NITRITE: NEGATIVE
PH: 6.5 (ref 5.0–9.0)
PROTEIN: NEGATIVE mg/dL
SPECIFIC GRAVITY: 1.004 (ref 1.001–1.030)
UROBILINOGEN: 0.2 mg/dL (ref 0.2–1.0)

## 2020-09-09 LAB — URINALYSIS, MICROSCOPIC: BACTERIA: NEGATIVE /hpf

## 2020-09-09 NOTE — ED Provider Notes (Signed)
[facility]       Name: Cindy Willis  Age and Gender: 64 y.o. female  PCP: No Pcp    Chief Complaint:  Chief Complaint   Patient presents with   . Abdominal Pain     History of Presenting Illness     Cindy Willis, date of birth May 25, 1956, is a 64 y.o. female who presents with thin female smoker tells me she has mild COPD and uses inhalers.  A history of a hysterectomy and bowel resection in the distant past.  Patient tells me that she has had some lower abdominal fullness and pressure over the past several days or weeks on and off.  She had reported some chills but does not have any fever.  No vomiting no problems eating no abdominal pain no current abdominal pain no upper abdominal pain no changes with her bowels does not flex her urine was stronger having odor or seemed infected.  Has never really had urine infections, urinalysis from triage has returned clean for infection....she DID see blood once when she white but she does have hemorrhoids    HPI    Review of System    Review of Systems   Constitutional: Negative for activity change, appetite change, chills and fatigue.   HENT: Negative for sore throat.    Eyes:        No overt vision changes   Respiratory: Negative for shortness of breath.    Cardiovascular: Negative for chest pain.   Gastrointestinal: Negative for abdominal pain, constipation, diarrhea, nausea and vomiting.   Genitourinary: Positive for dysuria. Negative for difficulty urinating.        Unclear if rectal or bladder vaginal area with wiping after bleeding but sought 1 time but not currently in the ER   Musculoskeletal: Negative for back pain and myalgias.   Skin: Negative for rash.   Allergic/Immunologic:        Allergies have been reviewed in the EMR.   Neurological: Negative for headaches.   Psychiatric/Behavioral:        Denies overt mental health issue        Histories   Past Medical History:  No past medical history on file.    Past Surgical History:  No past surgical  history on file.    Family History: No family history on file.    Social history:  Social History     Tobacco Use   . Smoking status: Not on file   Substance Use Topics   . Alcohol use: Not on file   . Drug use: Not on file     Social History     Substance and Sexual Activity   Drug Use Not on file     PCP: No Pcp    Allergies:   Allergies   Allergen Reactions   . Bacitracin Rash   . Penicillins Rash   . Baclofen  Other Adverse Reaction (Add comment)     FELT WIERD       Physical Exam      Filed Vitals:    09/09/20 1510   BP: 137/80   Pulse: 99   Resp: 18   Temp: 36.7 C (98.1 F)   SpO2: 98%     Physical Exam  Vitals and nursing note reviewed.   Constitutional:       General: She is not in acute distress.     Appearance: Normal appearance.   HENT:      Head: Normocephalic and atraumatic.  Mouth/Throat:      Mouth: Mucous membranes are moist.      Pharynx: Oropharynx is clear.   Eyes:      Conjunctiva/sclera: Conjunctivae normal.   Cardiovascular:      Rate and Rhythm: Normal rate and regular rhythm.      Heart sounds: Normal heart sounds.   Pulmonary:      Effort: Pulmonary effort is normal.      Breath sounds: Normal breath sounds. No wheezing.   Abdominal:      Palpations: Abdomen is soft.      Tenderness: There is no abdominal tenderness.   Musculoskeletal:         General: No swelling.      Cervical back: Neck supple.   Skin:     General: Skin is warm and dry.      Capillary Refill: Capillary refill takes less than 2 seconds.   Neurological:      Mental Status: She is alert.   Psychiatric:         Behavior: Behavior normal.          Orders     Orders Placed This Encounter   . URINALYSIS WITH REFLEX MICROSCOPIC AND CULTURE IF POSITIVE   . URINALYSIS, MACRO/MICRO   . URINALYSIS, MICROSCOPIC       Diagnostics     Labs:  Labs reviewed and interpreted by me.  Results for orders placed or performed during the hospital encounter of 09/09/20 (from the past 12 hour(s))   URINALYSIS, MACRO/MICRO   Result Value Ref  Range    COLOR Yellow Yellow    APPEARANCE Clear Clear    SPECIFIC GRAVITY 1.004 1.001 - 1.030    PH 6.5 5.0 - 9.0    PROTEIN Negative Negative mg/dL    GLUCOSE Negative Negative mg/dL    KETONES Negative Negative mg/dL    UROBILINOGEN 0.2  0.2 - 1.0 mg/dL    BILIRUBIN Negative Negative mg/dL    BLOOD Trace (A) Negative mg/dL    NITRITE Negative Negative    LEUKOCYTES Negative Negative WBCs/uL   URINALYSIS, MICROSCOPIC   Result Value Ref Range    SQUAMOUS EPITHELIAL None None /hpf    WBCS None None /hpf    RBCS None None /hpf    BACTERIA Negative Negative /hpf    HYALINE CASTS None None /lpf       Radiology:  No orders to display       EKG:   No results found for this visit on 09/09/20 (from the past 720 hour(s)).    ED Course/MDM      During the patient's stay in the emergency department, the above listed imaging and/or labs were performed to assist with medical decision making and were reviewed by myself when available for review.    Patient rechecked and remained stable throughout remainder of emergency department course.    All questions/concerns addressed, and patient agrees with disposition plan.          ED Course:  Normal exam appears reassuring afebrile nontender no active pain no nausea vomiting but did see some type of bleeding 1 time a after having a bowel movement and urinating.  Trace hematuria on today's urinalysis understands importance of following up as we definitely do not have a diagnosis I just do not believe this fits anything surgical or distinctly infectious at the present time but could be a non infectious some type of urinary irritation non infectious dysuria    Results:  Evaluation / Plan:              Medications given during ED stay include:  Medications - No data to display    Clinical Impression:   Encounter Diagnoses   Name Primary?   . Urinary disorder Yes   . Hematuria, unspecified type          Disposition: Discharged      Krystal Clark, MD  09/09/2020, 19:03

## 2020-09-09 NOTE — ED Triage Notes (Signed)
LOW ABD PAIN, COUGH, CHILLS. URINARY FREQUENCY

## 2020-09-09 NOTE — Discharge Instructions (Addendum)
Make sure to follow-up with your primary care physician to have your urine rechecked    There was a very very small amount of blood in today's urine the urine does not show signs of infection.

## 2020-09-10 ENCOUNTER — Encounter: Payer: Self-pay | Admitting: Internal Medicine

## 2020-09-11 DIAGNOSIS — M5136 Other intervertebral disc degeneration, lumbar region: Secondary | ICD-10-CM | POA: Diagnosis not present

## 2020-09-11 DIAGNOSIS — M9905 Segmental and somatic dysfunction of pelvic region: Secondary | ICD-10-CM | POA: Diagnosis not present

## 2020-09-11 DIAGNOSIS — M9903 Segmental and somatic dysfunction of lumbar region: Secondary | ICD-10-CM | POA: Diagnosis not present

## 2020-09-11 DIAGNOSIS — M5137 Other intervertebral disc degeneration, lumbosacral region: Secondary | ICD-10-CM | POA: Diagnosis not present

## 2020-09-13 DIAGNOSIS — M5137 Other intervertebral disc degeneration, lumbosacral region: Secondary | ICD-10-CM | POA: Diagnosis not present

## 2020-09-13 DIAGNOSIS — M5136 Other intervertebral disc degeneration, lumbar region: Secondary | ICD-10-CM | POA: Diagnosis not present

## 2020-09-13 DIAGNOSIS — M9903 Segmental and somatic dysfunction of lumbar region: Secondary | ICD-10-CM | POA: Diagnosis not present

## 2020-09-13 DIAGNOSIS — M9905 Segmental and somatic dysfunction of pelvic region: Secondary | ICD-10-CM | POA: Diagnosis not present

## 2020-09-17 DIAGNOSIS — M5136 Other intervertebral disc degeneration, lumbar region: Secondary | ICD-10-CM | POA: Diagnosis not present

## 2020-09-17 DIAGNOSIS — M5137 Other intervertebral disc degeneration, lumbosacral region: Secondary | ICD-10-CM | POA: Diagnosis not present

## 2020-09-17 DIAGNOSIS — M9905 Segmental and somatic dysfunction of pelvic region: Secondary | ICD-10-CM | POA: Diagnosis not present

## 2020-09-17 DIAGNOSIS — M9903 Segmental and somatic dysfunction of lumbar region: Secondary | ICD-10-CM | POA: Diagnosis not present

## 2020-09-19 DIAGNOSIS — M9903 Segmental and somatic dysfunction of lumbar region: Secondary | ICD-10-CM | POA: Diagnosis not present

## 2020-09-19 DIAGNOSIS — M9905 Segmental and somatic dysfunction of pelvic region: Secondary | ICD-10-CM | POA: Diagnosis not present

## 2020-09-19 DIAGNOSIS — M5137 Other intervertebral disc degeneration, lumbosacral region: Secondary | ICD-10-CM | POA: Diagnosis not present

## 2020-09-19 DIAGNOSIS — M5136 Other intervertebral disc degeneration, lumbar region: Secondary | ICD-10-CM | POA: Diagnosis not present

## 2020-09-24 DIAGNOSIS — M9905 Segmental and somatic dysfunction of pelvic region: Secondary | ICD-10-CM | POA: Diagnosis not present

## 2020-09-24 DIAGNOSIS — M5136 Other intervertebral disc degeneration, lumbar region: Secondary | ICD-10-CM | POA: Diagnosis not present

## 2020-09-24 DIAGNOSIS — M9903 Segmental and somatic dysfunction of lumbar region: Secondary | ICD-10-CM | POA: Diagnosis not present

## 2020-09-24 DIAGNOSIS — M5137 Other intervertebral disc degeneration, lumbosacral region: Secondary | ICD-10-CM | POA: Diagnosis not present

## 2020-09-26 DIAGNOSIS — M9903 Segmental and somatic dysfunction of lumbar region: Secondary | ICD-10-CM | POA: Diagnosis not present

## 2020-09-26 DIAGNOSIS — M5137 Other intervertebral disc degeneration, lumbosacral region: Secondary | ICD-10-CM | POA: Diagnosis not present

## 2020-09-26 DIAGNOSIS — M5136 Other intervertebral disc degeneration, lumbar region: Secondary | ICD-10-CM | POA: Diagnosis not present

## 2020-09-26 DIAGNOSIS — M9905 Segmental and somatic dysfunction of pelvic region: Secondary | ICD-10-CM | POA: Diagnosis not present

## 2020-11-01 DIAGNOSIS — Z01818 Encounter for other preprocedural examination: Secondary | ICD-10-CM | POA: Diagnosis not present

## 2020-11-01 DIAGNOSIS — K219 Gastro-esophageal reflux disease without esophagitis: Secondary | ICD-10-CM | POA: Diagnosis not present

## 2020-11-01 DIAGNOSIS — J410 Simple chronic bronchitis: Secondary | ICD-10-CM | POA: Diagnosis not present

## 2020-11-01 DIAGNOSIS — G4733 Obstructive sleep apnea (adult) (pediatric): Secondary | ICD-10-CM | POA: Diagnosis not present

## 2020-11-01 DIAGNOSIS — I10 Essential (primary) hypertension: Secondary | ICD-10-CM | POA: Diagnosis not present

## 2020-11-07 ENCOUNTER — Emergency Department
Admission: EM | Admit: 2020-11-07 | Discharge: 2020-11-07 | Disposition: A | Discharge status: Home / Self Care | Payer: Medicaid Other | Attending: Physician Assistant | Admitting: Physician Assistant

## 2020-11-07 ENCOUNTER — Encounter (HOSPITAL_COMMUNITY): Payer: Self-pay

## 2020-11-07 ENCOUNTER — Other Ambulatory Visit: Payer: Self-pay

## 2020-11-07 ENCOUNTER — Emergency Department (HOSPITAL_COMMUNITY): Payer: Medicaid Other

## 2020-11-07 DIAGNOSIS — Z20822 Contact with and (suspected) exposure to covid-19: Secondary | ICD-10-CM

## 2020-11-07 DIAGNOSIS — R059 Cough, unspecified: Secondary | ICD-10-CM

## 2020-11-07 DIAGNOSIS — Z880 Allergy status to penicillin: Secondary | ICD-10-CM

## 2020-11-07 DIAGNOSIS — R0981 Nasal congestion: Secondary | ICD-10-CM

## 2020-11-07 DIAGNOSIS — M5459 Other low back pain: Secondary | ICD-10-CM

## 2020-11-07 DIAGNOSIS — F17210 Nicotine dependence, cigarettes, uncomplicated: Secondary | ICD-10-CM | POA: Insufficient documentation

## 2020-11-07 DIAGNOSIS — F17200 Nicotine dependence, unspecified, uncomplicated: Secondary | ICD-10-CM

## 2020-11-07 DIAGNOSIS — M549 Dorsalgia, unspecified: Secondary | ICD-10-CM | POA: Insufficient documentation

## 2020-11-07 LAB — URINALYSIS, MICROSCOPIC: BACTERIA: NEGATIVE /hpf

## 2020-11-07 LAB — URINALYSIS, MACRO/MICRO
BILIRUBIN: NEGATIVE mg/dL
BLOOD: NEGATIVE mg/dL
GLUCOSE: NEGATIVE mg/dL
KETONES: NEGATIVE mg/dL
LEUKOCYTES: NEGATIVE WBCs/uL
NITRITE: NEGATIVE
PH: 6.5 (ref 5.0–9.0)
PROTEIN: NEGATIVE mg/dL
SPECIFIC GRAVITY: 1.004 (ref 1.001–1.030)
UROBILINOGEN: 0.2 mg/dL (ref 0.2–1.0)

## 2020-11-07 MED ORDER — DEXAMETHASONE SODIUM PHOSPHATE 4 MG/ML INJECTION SOLUTION
10.0000 mg | INTRAMUSCULAR | Status: AC
Start: 2020-11-07 — End: 2020-11-07
  Administered 2020-11-07: 10 mg via INTRAMUSCULAR
  Filled 2020-11-07: qty 5

## 2020-11-07 MED ORDER — METHYLPREDNISOLONE 4 MG TABLETS IN A DOSE PACK
ORAL_TABLET | ORAL | 0 refills | Status: AC
Start: 2020-11-07 — End: ?

## 2020-11-07 MED ORDER — CYCLOBENZAPRINE 5 MG TABLET
5.0000 mg | ORAL_TABLET | Freq: Three times a day (TID) | ORAL | 0 refills | Status: AC | PRN
Start: 2020-11-07 — End: 2020-11-11

## 2020-11-07 MED ORDER — ORPHENADRINE CITRATE 30 MG/ML INJECTION SOLUTION
30.0000 mg | Freq: Two times a day (BID) | INTRAMUSCULAR | Status: DC
Start: 2020-11-07 — End: 2020-11-07
  Administered 2020-11-07: 30 mg via INTRAMUSCULAR
  Filled 2020-11-07: qty 2

## 2020-11-07 NOTE — ED Provider Notes (Signed)
[facility]       Name: Cindy Willis  Age and Gender: 65 y.o. female  PCP: No Pcp    Chief Complaint:  Patient presents with     Chief Complaint   Patient presents with   . Back Pain   . Neck Pain   . Ear Pain       HPI:    Cindy Willis, date of birth 08-29-1956, is a 65 y.o. female who presents to the Emergency Department with a chief complaint of low back pain.  She tells me it is an aching pain for the last week - sometimes feels like it radiates up to her neck.  The neck pain is behind bilateral ears.  Denies fever, chills, hearing changes/loss, drainage from ears, headache, dizziness, tinnitus.  Has had some congestion and cough.      ROS:   Review of Systems   Constitutional: Negative for chills and fever.   HENT: Positive for congestion and ear pain. Negative for ear discharge and hearing loss.    Respiratory: Positive for cough.    Musculoskeletal: Positive for back pain and neck pain. Negative for neck stiffness.         PE:   ED Triage Vitals [11/07/20 0928]   BP (Non-Invasive) 136/71   Heart Rate 93   Respiratory Rate 18   Temperature 37 C (98.6 F)   SpO2 95 %   Weight 50.8 kg (111 lb 15.9 oz)   Height 1.6 m (5\' 3" )     Physical Exam  Constitutional:       General: She is not in acute distress.     Appearance: She is not ill-appearing.   HENT:      Head: Normocephalic and atraumatic.      Right Ear: Tympanic membrane, ear canal and external ear normal.      Left Ear: Tympanic membrane, ear canal and external ear normal.      Ears:      Comments: No tenderness over mastoid process bilaterally.  Not red or boggy.     Nose: Nose normal.      Mouth/Throat:      Mouth: Mucous membranes are moist.      Pharynx: Oropharynx is clear.   Eyes:      Conjunctiva/sclera: Conjunctivae normal.   Cardiovascular:      Rate and Rhythm: Normal rate and regular rhythm.      Pulses: Normal pulses.   Pulmonary:      Effort: Pulmonary effort is normal.      Breath sounds: Normal breath sounds.   Abdominal:       General: Abdomen is flat.      Palpations: Abdomen is soft.   Musculoskeletal:      Comments: Some lumbar paraspinal tenderness, no vertebral point tenderness.  Diffusely tender in cervical paraspinal muscles extending onto back of scalp.   Skin:     General: Skin is warm and dry.   Neurological:      General: No focal deficit present.      Mental Status: She is alert and oriented to person, place, and time. Mental status is at baseline.      Cranial Nerves: No cranial nerve deficit.      Sensory: No sensory deficit.      Motor: No weakness.      Coordination: Coordination normal.      Gait: Gait normal.   Psychiatric:         Mood and Affect:  Mood normal.         Behavior: Behavior normal.           Past Medical History:  Diagnosis     Past Medical History:   Diagnosis Date   . Thyroid disease        Past Surgical History:  History reviewed. No pertinent surgical history.    Family History: No family history on file.    Social History     Social History     Tobacco Use   . Smoking status: Current Every Day Smoker   . Smokeless tobacco: Never Used   Vaping Use   . Vaping Use: Never used   Substance Use Topics   . Alcohol use: Yes     Comment: SOCIAL   . Drug use: Never       Social History     Substance and Sexual Activity   Drug Use Never       No Pcp    Allergies   Allergen Reactions   . Bacitracin Rash   . Penicillins Rash   . Baclofen  Other Adverse Reaction (Add comment)     FELT WIERD         Diagnostics:    Labs:    Results for orders placed or performed during the hospital encounter of 11/07/20 (from the past 12 hour(s))   URINALYSIS, MACRO/MICRO   Result Value Ref Range    COLOR Yellow Yellow    APPEARANCE Cloudy (A) Clear    SPECIFIC GRAVITY 1.004 1.001 - 1.030    PH 6.5 5.0 - 9.0    PROTEIN Negative Negative mg/dL    GLUCOSE Negative Negative mg/dL    KETONES Negative Negative mg/dL    UROBILINOGEN 0.2  0.2 - 1.0 mg/dL    BILIRUBIN Negative Negative mg/dL    BLOOD Negative Negative mg/dL    NITRITE  Negative Negative    LEUKOCYTES Negative Negative WBCs/uL   URINALYSIS, MICROSCOPIC   Result Value Ref Range    SQUAMOUS EPITHELIAL None None /hpf    WBCS None None /hpf    RBCS None None /hpf    BACTERIA Negative Negative /hpf    HYALINE CASTS None None /lpf     Labs reviewed and interpreted by me.    Radiology:    XR CHEST AP AND LATERAL   Final Result by Edi, Radresults In (01/12 1148)      1.  No acute cardiopulmonary disease..           Signed by Marykay Lex, MD            ED Course/MDM:    Somewhat nonspecific complaints though they are reproducible on muscle palpation.  No evidence of ear infection or mastoiditis.  Does have some cough/congestion so I suppose could be achey from viral infection.  Given decadrol and norflex in ED with some improvement.  Outpt covid swab sent and will d/c on steroid/muscle relaxer.  Discussed return precautions.           Medications given during ED stay include:  Medications   dexamethasone 4 mg/mL injection (10 mg IntraMUSCULAR Given 11/07/20 1111)             Clinical Impression:   Encounter Diagnosis   Name Primary?   . Back pain, unspecified back location, unspecified back pain laterality, unspecified chronicity Yes       Disposition: Discharged      Prescriptions:   Discharge Medication List as of 11/07/2020  1:37 PM      START taking these medications    Details   cyclobenzaprine (FLEXERIL) 5 mg Oral Tablet Take 1 Tablet (5 mg total) by mouth Three times a day as needed for Muscle spasms for up to 4 days, Disp-12 Tablet, R-0, E-Rx      Methylprednisolone (MEDROL DOSEPACK) 4 mg Oral Tablets, Dose Pack Take as instructed., Disp-21 Tablet, R-0, E-Rx             Follow Up: No follow-up provider specified.        // Parthenia Ames, PA-C 11/07/2020 11:03  [facility] EMERGENCY MEDICINE  [facility]

## 2020-11-07 NOTE — ED Triage Notes (Signed)
PT STATES BACK PAIN, NECK PAIN, EAR PAIN, PAINFUL URINATION X 3 DAYS

## 2020-11-09 LAB — COVID-19 SCREENING - SEND-OUT: SARS-COV-2 RNA: DETECTED — AB

## 2020-11-10 NOTE — Result Encounter Note (Signed)
I called and spoke with patient to notify her of positive COVID testing results.  I explained to patient to quarantine and to return to the ED if she develops difficulty breathing or shortness of breath.  Patient states she cannot be expected to quarantine away from people.  She was instructed to quarantine so that COVID does not continue to spread.  She stated she will not do that.

## 2020-11-12 ENCOUNTER — Encounter: Payer: Self-pay | Admitting: Internal Medicine

## 2020-11-12 MED ORDER — BUPROPION HCL ER (XL) 150 MG PO TB24
150.0000 mg | ORAL_TABLET | Freq: Every day | ORAL | 1 refills | Status: DC
Start: 2020-11-12 — End: 2021-04-24

## 2020-11-30 ENCOUNTER — Other Ambulatory Visit: Payer: Self-pay | Admitting: Internal Medicine

## 2020-12-13 ENCOUNTER — Other Ambulatory Visit: Payer: Self-pay | Admitting: Internal Medicine

## 2020-12-14 ENCOUNTER — Encounter: Payer: Self-pay | Admitting: Internal Medicine

## 2020-12-14 MED ORDER — EZETIMIBE 10 MG PO TABS
10.0000 mg | ORAL_TABLET | Freq: Every day | ORAL | 0 refills | Status: DC
Start: 2020-12-14 — End: 2020-12-18

## 2020-12-18 ENCOUNTER — Encounter: Payer: Self-pay | Admitting: Internal Medicine

## 2020-12-18 ENCOUNTER — Other Ambulatory Visit: Payer: Self-pay

## 2020-12-18 ENCOUNTER — Ambulatory Visit: Payer: BC Managed Care – PPO | Admitting: Internal Medicine

## 2020-12-18 VITALS — BP 132/84 | HR 63 | Temp 97.9°F | Resp 16 | Ht 68.0 in | Wt 193.1 lb

## 2020-12-18 DIAGNOSIS — R739 Hyperglycemia, unspecified: Secondary | ICD-10-CM | POA: Diagnosis not present

## 2020-12-18 DIAGNOSIS — H9312 Tinnitus, left ear: Secondary | ICD-10-CM

## 2020-12-18 DIAGNOSIS — I10 Essential (primary) hypertension: Secondary | ICD-10-CM

## 2020-12-18 DIAGNOSIS — E785 Hyperlipidemia, unspecified: Secondary | ICD-10-CM

## 2020-12-18 DIAGNOSIS — H9192 Unspecified hearing loss, left ear: Secondary | ICD-10-CM

## 2020-12-18 LAB — BASIC METABOLIC PANEL
BUN: 16 mg/dL (ref 6–23)
CO2: 30 mEq/L (ref 19–32)
Calcium: 9.2 mg/dL (ref 8.4–10.5)
Chloride: 102 mEq/L (ref 96–112)
Creatinine, Ser: 0.84 mg/dL (ref 0.40–1.20)
GFR: 73.68 mL/min (ref >60.00)
Glucose, Bld: 83 mg/dL (ref 70–99)
Potassium: 4.2 mEq/L (ref 3.5–5.1)
Sodium: 140 mEq/L (ref 135–145)

## 2020-12-18 LAB — HEMOGLOBIN A1C: Hgb A1c MFr Bld: 6.1 % (ref 4.6–6.5)

## 2020-12-18 MED ORDER — EZETIMIBE 10 MG PO TABS: 10.0000 mg | ORAL_TABLET | Freq: Every day | ORAL | 3 refills | Status: DC

## 2020-12-18 NOTE — Patient Instructions (Addendum)
Check the  blood pressure regularly.  BP GOAL is between 110/65 and  135/85. If it is consistently higher or lower, let me know   GO TO THE LAB : Get the blood work     [place], [place] back for a physical examination 5  months

## 2020-12-18 NOTE — Progress Notes (Signed)
Pre visit review using our clinic review tool, if applicable. No additional management support is needed unless otherwise documented below in the visit note. 

## 2020-12-18 NOTE — Progress Notes (Signed)
Subjective:    Patient ID: Sheila Fuller, female    DOB: Dec 15, 1955, 65 y.o.   MRN: [id number]  DOS:  12/18/2020 Type of visit - description: Routine follow-up In general feels well. Patient noted gradual decrease on the left hearing for a while.  Went for a HOH screening and it came back positive. Needs "medical clearance" to get hearing aids. When asked, admits to some tinnitus, mostly at night. Denies dizziness, ear pain or ear discharge.   Review of Systems See above   Past Medical History:  Diagnosis Date   Allergic rhinitis    Allergy    Anxiety    Arthritis    Asthma    seasonally   BRCA1 positive    Cancer ([place])    skin   Carcinoma in situ of perianal skin    +XRT & chemo 2000; no further f/u w/ specialist.    Cataract    will remove left end of 10-2016   Class 1 obesity    Depression    Family history of BRCA gene mutation    daughter BRCA1 +   Family history of breast cancer    Family history of pancreatic cancer    GERD (gastroesophageal reflux disease)    takes zantac maybe 2/week   Hyperlipidemia    Hypertension    Lactose intolerance 10/04/2013   Microhematuria 01/2006   saw Dr.Peterson, renal u/s: r pyelocaliectasis but CT was neg, cysto neg   Shortness of breath    only with asthma flare up   Sleep apnea    rarely uses cpap    Past Surgical History:  Procedure Laterality Date   ANTERIOR CERVICAL DECOMP/DISCECTOMY FUSION N/A 08/23/2014   Procedure: C5-6 Anterior Cervical Discectomy and Fusion, Allograft, Plate;  Surgeon: Marybelle Killings, MD;  Location: [facility];  Service: Orthopedics;  Laterality: N/A;   BREAST SURGERY  09/2018   BRCA-1 gene positive, +reconstruction   cervical cauterization     CESAREAN SECTION     GANGLION CYST EXCISION     MASTECTOMY Bilateral ~ 1/22020   NASAL SEPTUM SURGERY     POLYPECTOMY     ROBOTIC ASSISTED BILATERAL SALPINGO OOPHERECTOMY Bilateral 11/24/2017   Procedure: XI ROBOTIC ASSISTED  BILATERAL SALPINGO OOPHORECTOMY;  Surgeon: Everitt Amber, MD;  Location: [facility];  Service: Gynecology;  Laterality: Bilateral;    Allergies as of 12/18/2020      Reactions   Amlodipine Swelling, Other (See Comments)   Edema/swelling in legs.   Statins    REACTION: myalgia In 2010 pain she reported aches and pains with cholesterol atorvastatin. Tried Pravachol: Memory loss Tried  Zetia, question of diarrhea   Sulfonamide Derivatives Rash      Medication List       Accurate as of December 18, 2020 11:59 PM. If you have any questions, ask your nurse or doctor.        albuterol 108 (90 Base) MCG/ACT inhaler Commonly known as: ProAir HFA Inhale 2 puffs into the lungs every 4 (four) hours as needed for wheezing or shortness of breath.   azelastine 0.1 % nasal spray Commonly known as: ASTELIN Place 2 sprays into both nostrils at bedtime. Use in each nostril as directed   buPROPion 150 MG 24 hr tablet Commonly known as: WELLBUTRIN XL Take 1 tablet (150 mg total) by mouth daily.   cyclobenzaprine 5 MG tablet Commonly known as: FLEXERIL Take 1 tablet (5 mg total) by mouth at bedtime as needed for muscle spasms.  desloratadine 5 MG tablet Commonly known as: CLARINEX Take 5 mg by mouth daily as needed (for allergies.).   esomeprazole 20 MG capsule Commonly known as: NEXIUM Take 20 mg by mouth daily at 12 noon.   ezetimibe 10 MG tablet Commonly known as: ZETIA Take 1 tablet (10 mg total) by mouth daily.   hydrochlorothiazide 25 MG tablet Commonly known as: HYDRODIURIL Take 1 tablet (25 mg total) by mouth daily.   losartan 100 MG tablet Commonly known as: COZAAR TAKE 1 TABLET DAILY   metoprolol tartrate 50 MG tablet Commonly known as: LOPRESSOR Take 1 tablet (50 mg total) by mouth daily.   naproxen 500 MG tablet Commonly known as: NAPROSYN Take 1 tablet (500 mg total) by mouth daily as needed for moderate pain.   psyllium 0.52 g capsule Commonly known as:  REGULOID Take 0.52 g by mouth daily.          Objective:   Physical Exam BP 132/84 (BP Location: Left Arm, Patient Position: Sitting, Cuff Size: Normal)    Pulse 63    Temp 97.9 F (36.6 C) (Oral)    Resp 16    Ht '5\' 8"'  (1.727 m)    Wt 193 lb 2 oz (87.6 kg)    SpO2 97%    BMI 29.36 kg/m  General:   Well developed, NAD, BMI noted. HEENT:  Normocephalic . Face symmetric, atraumatic TMs: Normal bilaterally.  Canals normal with no wax. Lungs:  CTA B Normal respiratory effort, no intercostal retractions, no accessory muscle use. Heart: RRR,  no murmur.  Lower extremities: no pretibial edema bilaterally  Skin: Not pale. Not jaundice Neurologic:  alert & oriented X3.  Speech normal, gait appropriate for age and unassisted Psych--  Cognition and judgment appear intact.  Cooperative with normal attention span and concentration.  Behavior appropriate. No anxious or depressed appearing.      Assessment       Assessment HTN Hyperlipidemia (statin intolerant) Anxiety, depression GERD Seasonal asthma Allergies Obesity : BMI~ 28 plus HTN, hyperlipidemia, OSA OSA: on Cpap consistently as off 03/2018 MSK: DJD, occasionally uses pain medication H/o Microhematuria 2007, Dr. Terance Hart, renal US showed right pyelocaliectasis, CT was negative, cystoscopy negative + BRCA: personal h/o (dx 2018)  and FH BRCA (daughter, sister).  See office visit 10/14/2017 Bilateral mastectomy 09/2018 H/o Carcinoma in situ, perianal skin, XRT, chemotherapy 2000. Not further follow-ups with specialist  PLAN:  HOH: Unilateral decreased hearing, on the left associated with tinnitus.  Recommend ENT eval before she gets hearing aids. HTN: BP is very good today, normal ambulatory BPs, continue HCTZ, losartan, metoprolol.  Check BMP. Mild hyperglycemia noted per chart review: Check A1c MSK: Doing okay, trying to switch from naproxen to Tylenol. High cholesterol: Good response to Zetia.  RF sent Preventive  care: Had COVID vaccines x3 Had a flu shot per patient RTC 5 months CPX    This visit occurred during the SARS-CoV-2 public health emergency.  Safety protocols were in place, including screening questions prior to the visit, additional usage of staff PPE, and extensive cleaning of exam room while observing appropriate contact time as indicated for disinfecting solutions.

## 2020-12-19 NOTE — Assessment & Plan Note (Signed)
HOH: Unilateral decreased hearing, on the left associated with tinnitus.  Recommend ENT eval before she gets hearing aids. HTN: BP is very good today, normal ambulatory BPs, continue HCTZ, losartan, metoprolol.  Check BMP. Mild hyperglycemia noted per chart review: Check A1c MSK: Doing okay, trying to switch from naproxen to Tylenol. High cholesterol: Good response to Zetia.  RF sent Preventive care: Had COVID vaccines x3 Had a flu shot per patient RTC 5 months CPX

## 2020-12-31 ENCOUNTER — Other Ambulatory Visit: Payer: Self-pay | Admitting: Internal Medicine

## 2021-01-07 ENCOUNTER — Telehealth: Payer: Self-pay

## 2021-01-07 NOTE — Telephone Encounter (Signed)
PA initiated via Covermymeds; KEY:  [id number]. PA cancelled by plan. Medication covered by plan w/o PA.

## 2021-01-10 ENCOUNTER — Encounter: Payer: Self-pay | Admitting: Internal Medicine

## 2021-01-22 NOTE — Progress Notes (Signed)
* * *        **[name]    --- ---    19 Y old Female, DOB: Oct 17, 1956, External MRN: [id number]    Account Number: [id number]    [address], [place], [id number]    Home: [phone]    Insurance: [employer]    PCP: Shelly Flatten, DO Referring: Shelly Flatten, DO    Appointment Facility: Neurosurgery        * * *    10/01/2016  Progress Notes: Glendell Docker, MD **CHN#:** [id number]    --- ---    ---        Reason for Appointment    ---      1\. 1 year status post L4-L5 decompression with continued lower back pain    ---      History of Present Illness    ---     _NEUROSURGERY_ [name] is s/p L4-5 decompression and fusion for spinal stenosis and  spondylolisthesis in December 2016. She complains of continued low back pain  which is most often constant but can be intermittent; also reports improved  stiffness, weakness with walking or any strenous activity. Reports that when  she puts weight on the L there is a zinging sensation that radiates down the  the L buttock to the lateral thigh. She reports improved ability to roll over  in bed or side to side.    Lower back pain radiates occassionally down to feet, which presents as  tingling and numbness in the bilateral feet. Reports that pain is triggered  with partial bending and/or reaching; pushing/pulling in certain positions  also triggers the pain. Not currently taking any medication for the pain; uses  ice packs for pain. Has continued to do PT on/off since last year; PT can been  concentrating on thoracic area. Has seen a pain clinic; wanted patient to see  MD Molinda Bailiff before any injections are given.      Current Medications    ---    Taking     * Mupirocin Ointment 1 application in nostril as needed for MRSA flareup Externally as directed    ---    * Nystatin     ---    * Omeprazole 40 MG Capsule Delayed Release 1 capsule. two days on, one day off Orally as directed    ---    * Seroquel 75 MG 1.5  tablet at bedtime Orally Once a day    ---    * Tums 500 MG Tablet Chewable 1 tablet as needed for GI upset Orally Four times a day    ---    * Vitamin D3 2000 UNIT Tablet 1 tablet Orally Once a day    ---    * Zoloft 100 MG Tablet 1 tablet in morning Orally Once a day    ---    Not-Taking/PRN    * Citalopram Hydrobromide 20 MG Tablet 1 tablet Orally Once a day    ---    * Tylenol 325 MG Tablet 2 tablets as needed Orally every 6 hrs    ---    * Medication List reviewed and reconciled with the patient    ---      Past Medical History    ---       Kidney Stones .        ---    Chronic Depression .        ---  Anxiety.        ---    Post Tramatic Stress Disorder .        ---    MRSA left nostril (flares up occasionally).        ---      Surgical History    ---      Appendectomy    ---    Percutaneous diskectomy L4-5    ---    L4-5 laminectomy and TLIF with bilateral L4-5 pedicle screw fixation 10/02/2015    ---      Family History    ---      Mother: deceased    ---    Father: deceased    ---    5 sister(s) .    ---      Social History    ---    Tobacco  history: Never smoked.    Work/Occupation: unemployed, Sport and exercise psychologist.      Allergies    ---      sulfa    ---    penicillin    ---    latex    ---    dark dye    ---      Hospitalization/Major Diagnostic Procedure    ---      see surgeries    ---      Review of Systems    ---     _GENERAL_ :    Negative for: chest pain,, breast discharge,, irregular heart beats,, heart  murmurs,, nausea / vomiting,, easy bruising,, fever / chills,, frequent nose  bleeds,, frequent headaches,, shortness of breath,, kidney disease,, liver  disease,, previous anesthesiea problems,, excess bleeding during or after  prior procedure,. Positive for: weight gain in the past year,, hoarse voice,.    _NEUROLOGY_ :    Positive for: ringing in the ears,, , back pain, weakness. Negative for: loss  of smell,, double vision,, difficulty swallowing,, vision loss in one eye or  the other,, hearing loss,,  trouble with balance and coordination,.          Vital Signs    ---    Pain scale 7, Ht-in 5'1", Wt-lbs 120, BMI 22.67, BP 124/59, HR 71, BSA 1.53,  Wt-kg 54.43, Wt Change 1 lb.      Physical Examination    ---     _NEUROSURGERY_ :    MOTOR Lower Extremities : 5/5 strength in the bilateral lower extremities.  SENSATION : Intact to light touch in the bilateral lower extremities Reports  slightly altered sensation to L thigh vs R thigh.. Reflexes Right Ankle Jerk  1+, Left Ankle Jerk 1+, Right Knee Jerk 2+, Left Knee Jerk 2+. Gait : WNL.  Babinski : Negative. Ankle Clonus : Negative.          Assessments    ---    1\. Low back pain - M54.5 (Primary)    ---    2\. Other chronic pain - G89.29    ---      Patient presents with continued lower back pain that has not completely  improved after surgery. Reviewing xrays that were completed indicates that  hardware is still positioned well. I discussed continuning PT with the patient  and the possibility that accupuncture may improve her pain. I do not believe  at this time that injections into her neck will improve her pain. Patient's  questions regarding previous surgery were answered.    ---      Follow Up    ---  prn    Electronically signed by Glendell Docker , MD on 10/03/2016 at 02:14 PM EST    Sign off status: Completed        * * *        Neurosurgery    [address]    [place], [place] [address]    Tel: [phone]    Fax: [phone]              * * *          Patient: [name], [name] DOB: 07/03/1956 Progress Note: Glendell Docker, MD  10/01/2016    ---    Note generated by eClinicalWorks EMR/PM Software ([email or web address])

## 2021-01-22 NOTE — Progress Notes (Signed)
* * *        **[name]    --- ---    75 Y old Female, DOB: 24-Sep-1956, External MRN: [id number]    Account Number: [id number]    [address], [place], [id number]    Home: [phone]    Insurance: [employer]    PCP: Shelly Flatten, DO Referring: Shelly Flatten, DO    Appointment Facility: Neurosurgery        * * *    02/06/2016  Progress Notes: Glendell Docker, MD **CHN#:** [id number]    --- ---    ---        Reason for Appointment    ---      1\. postoperative followup status post L4-5 laminectomy and TLIF  (transforaminal lumbar interbody fusion) with bilateral pedicle screw fixation  10/02/15    ---      History of Present Illness    ---     _Associated Providers_ :    Primary Care Provider Name: Shelly Flatten, MD Address: [address] [place] [address] Phone: [phone] Fax:[phone].    _NEUROSURGERY_ :    [name] is s/p L4-5 decompression and fusion for spinal stenosis and  spondylolisthesis in December 2016. She complains of low back pain, stiffness,  weakness with walking or any strenous activity. She has trouble rolling over  in bed or side to side. She is going to PT for 27 sessions which is going to  end next week and then she will do her own exercises at the gym.      Current Medications    ---    Taking     * Citalopram Hydrobromide 20 MG Tablet 1 tablet Once a day    ---    * Mupirocin Ointment 1 application in nostril as needed for MRSA flareup as directed    ---    * Omeprazole 40 MG Capsule Delayed Release 1 capsule. two days on, one day off as directed    ---    * Seroquel 75 MG 1.5 tablet at bedtime Once a day    ---    * Tums 500 MG Tablet Chewable 1 tablet as needed for GI upset Four times a day    ---    * Tylenol 325 MG Tablet 2 tablets as needed every 6 hrs    ---    * Vitamin D3 2000 UNIT Tablet 1 tablet Once a day    ---    * Zoloft 100 MG Tablet 1 tablet in morning Once a day    ---    * Medication List reviewed and  reconciled with the patient    ---      Past Medical History    ---       Kidney Stones .        ---    Chronic Depression .        ---    Anxiety.        ---    Post Tramatic Stress Disorder .        ---    MRSA left nostril (flares up occasionally).        ---      Surgical History    ---      Appendectomy    ---    Percutaneous diskectomy L4-5    ---    L4-5 laminectomy and TLIF with bilateral L4-5 pedicle screw fixation 10/02/2015    ---  Family History    ---      Mother: deceased    ---    Father: deceased    ---    5 sister(s) .    ---      Social History    ---    Tobacco  history: Never smoked.    Work/Occupation: unemployed, previously in assisted living facility.      Allergies    ---      sulfa    ---    penicillin    ---    latex    ---    dark dye    ---      Hospitalization/Major Diagnostic Procedure    ---      see surgeries    ---      Vital Signs    ---    Pain scale 8, Ht-in 5'1", Wt-lbs 119, BMI 22.48.      Physical Examination    ---     _NEUROSURGERY_ :    MOTOR Lower Extremities : 5/5 strength in the bilateral lower extremities.  Reflexes Right Ankle Jerk 1+, Left Ankle Jerk 1+, Right Knee Jerk 2+, Left  Knee Jerk 2+.    _DIAGNOSTIC STUDIES_ :    RADIOLOGY I reviewed CT lumbar spine performed on 01/29/16 at [facility] shows  excellent hardware position without evidence of loosening and with evidence of  early fusion. MRI I reviewed MRI lumbar spine performed with and without  gadolinium on 01/29/16 at [facility] shows excellent decompression.          Assessments    ---    1\. Spondylolisthesis at L4-L5 level - M43.16 (Primary)    ---    2\. Spinal stenosis at L4-L5 level - M48.06    ---      Imp: [name]'s postop imaging looks excellent and I think her residual pain  is likely related to muscular deconditioning and possibly amplified by her  chronic pain issues. I recommended continued PT and exercise.    ---      Follow Up    ---    1 Year (Reason: lumbar flex/ex films)    Electronically signed by  Glendell Docker , MD on 02/08/2016 at 10:22 AM EDT    Sign off status: Completed        * * *        Neurosurgery    [address]    [place], [place] [address]    Tel: [phone]    Fax: [phone]              * * *          Patient: [name], [name] DOB: 06/09/1956 Progress Note: Glendell Docker, MD  02/06/2016    ---    Note generated by eClinicalWorks EMR/PM Software ([email or web address])

## 2021-01-22 NOTE — Progress Notes (Signed)
* * *        **[name]    --- ---    21 Y old Female, DOB: July 17, 1956, External MRN: [id number]    Account Number: [id number]    [address], [place], [place]    Home: [phone]    Insurance: MEDICARE    PCP: Stacey Drain, MD Referring: Stacey Drain, MD    Appointment Facility: Neurosurgery        * * *    01/07/2018  Progress Notes: Glendell Docker, MD **CHN#:** [id number]    --- ---    ---         **History of Present Illness**    ---     _Associated Providers_ :    Primary Care Provider Danru Norma Fredrickson, MD.    _NEUROSURGERY_ :    65 yo F s/p L4-5 TLIF in 2016 who sustained a mechanical fall on 3/9. She was  getting out her truck when her foot caught on the mat. She fell to the ground  with outstretched arms and fell to her knees. She denies head strike. Since  the event she complains of mild back pain without radicular symptoms and  presents today to rule out a problem with her instrumentation from her prior  fusion. Xrays today rule out hardware failure, change in graft position, or  pseudoarthrosis.       **Current Medications**    ---    Taking     * Advil     ---    * Mupirocin Ointment 1 application in nostril as needed for MRSA flareup Externally as directed    ---    * Nystatin 100000 UNIT/GM Ointment 1 application to affected area as needed Externally     ---    * Omeprazole 40 MG Capsule Delayed Release 1 capsule. two days on, one day off Orally as directed    ---    * Seroquel 100 MG Tablet 1 tablet Orally Once a day    ---    * Tums 500 MG Tablet Chewable 1 tablet as needed for GI upset Orally Four times a day    ---    * Vitamin D3 2000 UNIT Tablet 1 tablet Orally Once a day    ---    * Zoloft 100 MG Tablet 1.5 tablet in morning Orally Once a day    ---    Not-Taking/PRN    * Citalopram Hydrobromide 20 MG Tablet 1 tablet Orally Once a day    ---    * Iron 325 (65 Fe) MG Tablet 1 tablet Orally Once a day    ---    * Tylenol 325 MG Tablet 2 tablets as  needed Orally every 6 hrs    ---       **Past Medical History**    ---       Kidney Stones .        ---    Chronic Depression .        ---    Anxiety.        ---    Post Tramatic Stress Disorder .        ---    MRSA left nostril (flares up occasionally).        ---       **Surgical History**    ---       Appendectomy    ---    Percutaneous diskectomy L4-5    ---  L4-5 laminectomy and TLIF with bilateral L4-5 pedicle screw fixation 10/02/2015    ---       **Family History**    ---       Mother: deceased    ---    Father: deceased    ---    5 sister(s) .    ---      **Social History**    ---    Tobacco  history: Never smoked.    Work/Occupation: unemployed, Sport and exercise psychologist.      **Allergies**    ---       sulfa    ---    penicillin    ---    latex    ---    dark dye    ---      **Hospitalization/Major Diagnostic Procedure**    ---       see surgeries    ---      **Vital Signs**    ---    Pain scale 3, Ht-in 5'1", Wt-lbs 134, BMI 25.32, BP 124/71, HR 64.       **Physical Examination**    ---    5/5 in bilateral IP, quads, AT, EHL, and gastrocs    Sensation intact and symmetric to light touch throughout the bilateral lower  extremities    Reflexes 1+ in the bilateral patellar and achilles tendons.       **Assessments**    ---    1\. Low back pain at multiple sites - M54.5 (Primary)    ---      65 yo F s/p L4-5 TLIF in 2016 who sustained a mechanical fall on 3/9 and has  experienced mild back pain as a result. Xrays rule out any problem with her  fusion or hardware. She most likely is experiencing muscular strain, which  showuld improve with time. She was counseled and provided with suggestions for  conservative management. She may follow-up PRN.    ---       **Treatment**    ---       **1\. Others**    Notes: Conservative management for muscular back pain    Follow-up PRN.    ---      **Follow Up**    ---    prn    Electronically signed by Glendell Docker , MD on 01/08/2018 at 03:39 PM EDT    Sign off status: Completed        * *  *        Neurosurgery    [address]    [place], [place] [address]    Tel: [phone]    Fax: [phone]              * * *          Patient: [name], [name] DOB: Nov 16, 1955 Progress Note: Glendell Docker, MD  01/07/2018    ---    Note generated by eClinicalWorks EMR/PM Software ([email or web address])

## 2021-01-22 NOTE — Progress Notes (Signed)
* * *        **[name]    --- ---    31 Y old Female, DOB: 1956-02-10, External MRN: [id number]    Account Number: [id number]    [address], [place], [place]    Home: [phone]    Insurance: [employer] [place] [employer]    PCP: Stacey Drain, MD Referring: Stacey Drain, MD    Appointment Facility: Neurosurgery        * * *    03/04/2017  Progress Notes: Glendell Docker, MD **CHN#:** [id number]    --- ---    ---        Reason for Appointment    ---      1\. Lower back pain and left leg pain    ---      History of Present Illness    ---     _NEUROSURGERY_ :    65 year old female presents s/p L4-5 decompression and fusion for spinal  stenosis and spondylolisthesis in December 2016. She complains of continued  low back pain which is most often constant but can be intermittent; also  reports stiffness, weakness with walking or any strenuous activity. Patient  reports that she has the stiffness and weakness with walking especially when  getting out of the chair. Reports that when she puts weight on the L leg there  is a zinging sensation that radiates down the L buttock to the lateral thigh.  She reports improved ability to roll over in bed or side to side.    Lower back pain radiates occasionally down to feet, which presents as tingling  and numbness in the bilateral feet. Reports that pain is triggered with  partial bending and/or reaching; pushing/pulling in certain positions also  triggers the pain. Both legs are effected equally with pain. Patient reports  that her lower back pain is usually worse than the leg pain.    Patient is currently taking Advil PRN for the pain; uses ice packs for pain.  Patient has done PT in the past but is not currently doing PT. Patient does  not want an ESI at this time but is considering acupuncture.      Current Medications    ---    Taking     * Iron 325 (65 Fe) MG Tablet 1 tablet Orally Once a day    ---    * Mupirocin Ointment 1 application in  nostril as needed for MRSA flareup Externally as directed    ---    * Nystatin 100000 UNIT/GM Ointment 1 application to affected area as needed Externally     ---    * Omeprazole 40 MG Capsule Delayed Release 1 capsule. two days on, one day off Orally as directed    ---    * Seroquel 100 MG Tablet 1 tablet Orally Once a day    ---    * Tums 500 MG Tablet Chewable 1 tablet as needed for GI upset Orally Four times a day    ---    * Vitamin D3 2000 UNIT Tablet 1 tablet Orally Once a day    ---    * Zoloft 100 MG Tablet 1.5 tablet in morning Orally Once a day    ---    Not-Taking/PRN    * Citalopram Hydrobromide 20 MG Tablet 1 tablet Orally Once a day    ---    * Tylenol 325 MG Tablet 2  tablets as needed Orally every 6 hrs    ---    * Medication List reviewed and reconciled with the patient    ---      Past Medical History    ---       Kidney Stones .        ---    Chronic Depression .        ---    Anxiety.        ---    Post Tramatic Stress Disorder .        ---    MRSA left nostril (flares up occasionally).        ---      Surgical History    ---      Appendectomy    ---    Percutaneous diskectomy L4-5    ---    L4-5 laminectomy and TLIF with bilateral L4-5 pedicle screw fixation 10/02/2015    ---      Family History    ---      Mother: deceased    ---    Father: deceased    ---    5 sister(s) .    ---      Social History    ---    Tobacco  history: Never smoked.    Work/Occupation: unemployed, Sport and exercise psychologist.      Allergies    ---      sulfa    ---    penicillin    ---    latex    ---    dark dye    ---      Hospitalization/Major Diagnostic Procedure    ---      see surgeries    ---      Vital Signs    ---    Pain scale 5, Ht-in 5'1", Wt-lbs 130 per pt, BP 127/69, RR 63.      Physical Examination    ---     _NEUROSURGERY_ :    MOTOR Lower Extremities : 5/5 strength in the bilateral lower extremities.  SENSATION : Intact to light touch in the bilateral lower extremities Reports  slightly altered sensation to L thigh vs R thigh.  Slightly altered sensation  to the right foot. . Reflexes Right Ankle Jerk 1+, Left Ankle Jerk 1+, Right  Knee Jerk 2+, Left Knee Jerk 2+. Gait : WNL. Babinski : Negative. Ankle Clonus  : Negative.    _DIAGNOSTIC STUDIES_ :    RADIOLOGY  I reviewed xrays of the lumbar spine completed 03/04/17 at [facility]. .          Assessments    ---    1\. Low back pain - M54.5 (Primary)    ---      Reviewing patient's xrays from today there is no instability noted and the  hardware appears to be well positioned. I believe that patient's current  symptoms are muscular in nature. The patient reports that her lower back pain  is worse with bending and reaching. The patient should review her current  physical exercise plan and determine if there is any way to change this to  improve her pain. She should also consider acupuncture.    ---      Follow Up    ---    prn    Electronically signed by Glendell Docker , MD on 03/04/2017 at 05:37 PM EDT    Sign off status: Completed        * * *  Neurosurgery    [address]    [place], [place] [address]    Tel: [phone]    Fax: [phone]              * * *          Patient: [name], [name] DOB: [birth year] Progress Note: Glendell Docker, MD  03/04/2017    ---    Note generated by eClinicalWorks EMR/PM Software ([email or web address])

## 2021-01-22 NOTE — Progress Notes (Signed)
* * *        **  [name]**    --- ---    56 Y old Female, DOB: 11/11/1955    [address], [address], [place], [place] [address]    Home: [phone]    Provider: Verdon Cummins, NP        * * *    Telephone Encounter    ---    Answered by   Verdon Cummins  Date: 10/28/2016         Time: 10:17 AM    Caller   Patient    --- ---            Reason   new lower back pain            Message                      Patient reports that on last Saturday night she attempted to lay on her abdomen when going to sleep and she immediately felt pain that radiated from her lower back to her left leg to her left foot. The pain is now felt occassionally but is not constant and is described as an ache. Patient had tylenol yesterday; patient unable to state if this was effective. Discussed with Dr. Molinda Bailiff who does not believe that further imaging is warrented at this time and that patient should monitor over time. Patient made aware of this.                     * * *                ---          * * *          Patient: [name], [name] DOB: 06/13/1956 Provider: Verdon Cummins, NP  10/28/2016    ---    Note generated by eClinicalWorks EMR/PM Software ([email or web address])

## 2021-01-23 DIAGNOSIS — H903 Sensorineural hearing loss, bilateral: Secondary | ICD-10-CM | POA: Diagnosis not present

## 2021-01-29 DIAGNOSIS — H903 Sensorineural hearing loss, bilateral: Secondary | ICD-10-CM | POA: Diagnosis not present

## 2021-02-18 DIAGNOSIS — Z1501 Genetic susceptibility to malignant neoplasm of breast: Secondary | ICD-10-CM | POA: Diagnosis not present

## 2021-02-18 DIAGNOSIS — Z1509 Genetic susceptibility to other malignant neoplasm: Secondary | ICD-10-CM | POA: Diagnosis not present

## 2021-02-18 DIAGNOSIS — Z9889 Other specified postprocedural states: Secondary | ICD-10-CM | POA: Diagnosis not present

## 2021-02-18 DIAGNOSIS — Z9013 Acquired absence of bilateral breasts and nipples: Secondary | ICD-10-CM | POA: Diagnosis not present

## 2021-02-24 HISTORY — PX: OTHER SURGICAL HISTORY: SHX169

## 2021-03-13 ENCOUNTER — Other Ambulatory Visit: Payer: Self-pay | Admitting: Internal Medicine

## 2021-03-15 DIAGNOSIS — Z87891 Personal history of nicotine dependence: Secondary | ICD-10-CM | POA: Diagnosis not present

## 2021-03-15 DIAGNOSIS — Z1509 Genetic susceptibility to other malignant neoplasm: Secondary | ICD-10-CM | POA: Diagnosis not present

## 2021-03-15 DIAGNOSIS — N650 Deformity of reconstructed breast: Secondary | ICD-10-CM | POA: Diagnosis not present

## 2021-03-15 DIAGNOSIS — I10 Essential (primary) hypertension: Secondary | ICD-10-CM | POA: Diagnosis not present

## 2021-03-15 DIAGNOSIS — M1990 Unspecified osteoarthritis, unspecified site: Secondary | ICD-10-CM | POA: Diagnosis not present

## 2021-03-15 DIAGNOSIS — J449 Chronic obstructive pulmonary disease, unspecified: Secondary | ICD-10-CM | POA: Diagnosis not present

## 2021-03-15 DIAGNOSIS — Z421 Encounter for breast reconstruction following mastectomy: Secondary | ICD-10-CM | POA: Diagnosis not present

## 2021-03-15 DIAGNOSIS — Z980 Intestinal bypass and anastomosis status: Secondary | ICD-10-CM | POA: Diagnosis not present

## 2021-03-15 DIAGNOSIS — Z9013 Acquired absence of bilateral breasts and nipples: Secondary | ICD-10-CM | POA: Diagnosis not present

## 2021-03-15 DIAGNOSIS — Z9989 Dependence on other enabling machines and devices: Secondary | ICD-10-CM | POA: Diagnosis not present

## 2021-03-15 DIAGNOSIS — L987 Excessive and redundant skin and subcutaneous tissue: Secondary | ICD-10-CM | POA: Diagnosis not present

## 2021-03-15 DIAGNOSIS — K219 Gastro-esophageal reflux disease without esophagitis: Secondary | ICD-10-CM | POA: Diagnosis not present

## 2021-03-15 DIAGNOSIS — G4733 Obstructive sleep apnea (adult) (pediatric): Secondary | ICD-10-CM | POA: Diagnosis not present

## 2021-03-15 DIAGNOSIS — L905 Scar conditions and fibrosis of skin: Secondary | ICD-10-CM | POA: Diagnosis not present

## 2021-03-15 DIAGNOSIS — Z1501 Genetic susceptibility to malignant neoplasm of breast: Secondary | ICD-10-CM | POA: Diagnosis not present

## 2021-03-15 DIAGNOSIS — Z79899 Other long term (current) drug therapy: Secondary | ICD-10-CM | POA: Diagnosis not present

## 2021-03-15 DIAGNOSIS — Z9889 Other specified postprocedural states: Secondary | ICD-10-CM | POA: Diagnosis not present

## 2021-03-15 DIAGNOSIS — Z85828 Personal history of other malignant neoplasm of skin: Secondary | ICD-10-CM | POA: Diagnosis not present

## 2021-03-20 ENCOUNTER — Encounter: Payer: Self-pay | Admitting: Internal Medicine

## 2021-03-20 MED ORDER — NAPROXEN 500 MG PO TABS: 500.0000 mg | ORAL_TABLET | Freq: Every day | ORAL | 0 refills | Status: DC | PRN

## 2021-03-26 DIAGNOSIS — Z9889 Other specified postprocedural states: Secondary | ICD-10-CM | POA: Diagnosis not present

## 2021-03-26 DIAGNOSIS — Z1509 Genetic susceptibility to other malignant neoplasm: Secondary | ICD-10-CM | POA: Diagnosis not present

## 2021-03-26 DIAGNOSIS — Z1501 Genetic susceptibility to malignant neoplasm of breast: Secondary | ICD-10-CM | POA: Diagnosis not present

## 2021-03-26 DIAGNOSIS — Z9013 Acquired absence of bilateral breasts and nipples: Secondary | ICD-10-CM | POA: Diagnosis not present

## 2021-04-04 ENCOUNTER — Encounter (HOSPITAL_COMMUNITY): Payer: Self-pay

## 2021-04-04 ENCOUNTER — Emergency Department (HOSPITAL_COMMUNITY): Payer: Medicaid Other

## 2021-04-04 ENCOUNTER — Other Ambulatory Visit: Payer: Self-pay

## 2021-04-04 ENCOUNTER — Emergency Department
Admission: EM | Admit: 2021-04-04 | Discharge: 2021-04-04 | Disposition: A | Discharge status: Home / Self Care | Payer: Medicaid Other | Attending: Emergency Medicine | Admitting: Emergency Medicine

## 2021-04-04 DIAGNOSIS — R079 Chest pain, unspecified: Secondary | ICD-10-CM

## 2021-04-04 DIAGNOSIS — R059 Cough, unspecified: Secondary | ICD-10-CM

## 2021-04-04 DIAGNOSIS — R3589 Other polyuria: Secondary | ICD-10-CM

## 2021-04-04 DIAGNOSIS — J029 Acute pharyngitis, unspecified: Secondary | ICD-10-CM

## 2021-04-04 DIAGNOSIS — Z20822 Contact with and (suspected) exposure to covid-19: Secondary | ICD-10-CM | POA: Insufficient documentation

## 2021-04-04 DIAGNOSIS — J449 Chronic obstructive pulmonary disease, unspecified: Secondary | ICD-10-CM | POA: Insufficient documentation

## 2021-04-04 HISTORY — DX: Arthropathy, unspecified: M12.9

## 2021-04-04 HISTORY — DX: Chronic obstructive pulmonary disease, unspecified (CMS HCC): J44.9

## 2021-04-04 HISTORY — DX: Fibromyalgia: M79.7

## 2021-04-04 HISTORY — DX: Headache, unspecified: R51.9

## 2021-04-04 LAB — TROPONIN I, HIGH SENSITIVITY: TROPONIN I, HIGH SENSITIVITY: <2 pg/mL — ABNORMAL LOW (ref 2.00–20.00)

## 2021-04-04 LAB — RESPIRATORY VIRUS PANEL
ADENOVIRUS ARRAY: NOT DETECTED
BORDETELLA PERTUSSIS ARRAY: NOT DETECTED
CHLAMYDOPHILA PNEUMONIAE ARRAY: NOT DETECTED
CORONAVIRUS 229E: NOT DETECTED
CORONAVIRUS HKU1: NOT DETECTED
CORONAVIRUS NL63: NOT DETECTED
CORONAVIRUS OC43: NOT DETECTED
INFLUENZA A (NO SUBTYPE DETECTED): NOT DETECTED
INFLUENZA A H1 2009: NOT DETECTED
INFLUENZA A H1: NOT DETECTED
INFLUENZA A H3: NOT DETECTED
INFLUENZA B ARRAY: NOT DETECTED
METAPNEUMOVIRUS ARRAY: NOT DETECTED
MYCOPLASMA PNEUMONIAE ARRAY: NOT DETECTED
PARAINFLUENZA 1 ARRAY: NOT DETECTED
PARAINFLUENZA 2 ARRAY: NOT DETECTED
PARAINFLUENZA 3 ARRAY: NOT DETECTED
PARAINFLUENZA 4 ARRAY: NOT DETECTED
RHINOVIRUS/ENTEROVIRUS ARRAY: NOT DETECTED
RSV ARRAY: NOT DETECTED
SARS CORONAVIRUS 2 (SARS-CoV-2): NOT DETECTED

## 2021-04-04 LAB — PTT (PARTIAL THROMBOPLASTIN TIME): APTT: 28.8 seconds (ref 23.0–32.0)

## 2021-04-04 LAB — COMPREHENSIVE METABOLIC PANEL, NON-FASTING
ALBUMIN/GLOBULIN RATIO: 1.5 (ref >=1.0)
ALBUMIN: 3.8 g/dL (ref 3.5–5.2)
ALKALINE PHOSPHATASE: 71 U/L (ref 41–133)
ALT (SGPT): 4 U/L (ref 0–55)
ANION GAP: 8 mmol/L (ref 6–15)
AST (SGOT): 8 U/L (ref 5–34)
BILIRUBIN TOTAL: 0.6 mg/dL (ref 0.2–1.2)
BUN: 5 mg/dL — ABNORMAL LOW (ref 7–21)
CALCIUM: 9.1 mg/dL (ref 8.0–10.6)
CHLORIDE: 104 mmol/L (ref 98–107)
CO2 TOTAL: 28 mmol/L (ref 21–32)
CREATININE: 0.69 mg/dL — ABNORMAL LOW (ref 0.80–1.60)
ESTIMATED GFR: >60 mL/min/{1.73_m2}
GLUCOSE: 81 mg/dL (ref 70–100)
POTASSIUM: 4 mmol/L (ref 3.3–5.1)
PROTEIN TOTAL: 6.4 g/dL (ref 6.4–8.3)
SODIUM: 140 mmol/L (ref 136–146)

## 2021-04-04 LAB — URINALYSIS, MACRO/MICRO
BILIRUBIN: NEGATIVE mg/dL
BLOOD: NEGATIVE mg/dL
GLUCOSE: NEGATIVE mg/dL
KETONES: NEGATIVE mg/dL
LEUKOCYTES: NEGATIVE WBCs/uL
NITRITE: NEGATIVE
PH: 7 (ref 5.0–9.0)
PROTEIN: NEGATIVE mg/dL
SPECIFIC GRAVITY: 1.005 (ref 1.001–1.030)
UROBILINOGEN: 0.2 mg/dL (ref 0.2–1.0)

## 2021-04-04 LAB — CBC WITH DIFF
BASOPHIL #: <0.1 10*3/uL (ref <=0.20)
BASOPHIL %: 1 %
EOSINOPHIL #: 0.1 10*3/uL (ref <=0.50)
EOSINOPHIL %: 2 %
HCT: 38.8 % (ref 34.8–46.0)
HGB: 12.8 g/dL (ref 11.5–16.0)
IMMATURE GRANULOCYTE #: <0.1 10*3/uL (ref <0.10)
IMMATURE GRANULOCYTE %: 0 % (ref 0–1)
LYMPHOCYTE #: 2.09 10*3/uL (ref 1.00–4.80)
LYMPHOCYTE %: 34 %
MCH: 29.6 pg (ref 26.0–32.0)
MCHC: 33 g/dL (ref 31.0–35.5)
MCV: 89.8 fL (ref 78.0–100.0)
MONOCYTE #: 0.51 10*3/uL (ref 0.20–1.10)
MONOCYTE %: 8 %
MPV: 9.8 fL (ref 8.7–12.5)
NEUTROPHIL #: 3.32 10*3/uL (ref 1.50–7.70)
NEUTROPHIL %: 55 %
PLATELETS: 259 10*3/uL (ref 150–400)
RBC: 4.32 10*6/uL (ref 3.85–5.22)
RDW-CV: 13 % (ref 11.5–15.5)
WBC: 6.1 10*3/uL (ref 3.7–11.0)

## 2021-04-04 LAB — MAGNESIUM: MAGNESIUM: 2 mg/dL (ref 1.5–2.5)

## 2021-04-04 LAB — ECG 12 LEAD
Atrial Rate: 97 {beats}/min
Calculated P Axis: 62 degrees
Calculated R Axis: 64 degrees
Calculated T Axis: 76 degrees
PR Interval: 168 ms
QRS Duration: 88 ms
QT Interval: 346 ms
QTC Calculation: 434 ms
Ventricular rate: 95 {beats}/min

## 2021-04-04 LAB — PT/INR
INR: 1.19 — ABNORMAL HIGH (ref 0.90–1.10)
PROTHROMBIN TIME: 12 seconds (ref 9.0–13.0)

## 2021-04-04 LAB — RAPID THROAT SCREEN, STREPTOCOCCUS, WITH REFLEX: THROAT RAPID SCREEN, STREPTOCOCCUS: NEGATIVE

## 2021-04-04 NOTE — ED Triage Notes (Signed)
Cough, cp with cough... increased urination, sore throat x 8 days

## 2021-04-04 NOTE — ED Provider Notes (Signed)
Department of Emergency Medicine  [facility]  04/04/2021        Chief Complaint   Patient presents with   . Cough   . Sore Throat   . Polyuria       Patient is a 65 y.o.  female presenting to the ED with chief complaint of cough.  Limited sputum production.  Mild shortness of breath related to underlying COPD.  Patient with improvement with her breathing treatments at home.  She has had polyuria without dysuria.  Mild urgency.  Mild sore throat.  Low-grade temperature.  Patient had COVID last in January and recovered from same.      Review of Systems:    Constitutional:  Low-grade fever no chills.  Skin: No rashes or lesions  HENT:  Mild sore throat  Eyes: No vision changes, redness, discharge  Cardio: No chest pain, palpitations   Respiratory:  Mild cough with limited sputum production.  Mild shortness of breath improved with breathing treatment  GI:  No nausea or vomiting. No diarrhea or constipation. No abdominal pain  GU:  No dysuria, hematuria, polyuria  MSK: No joint pain.  No neck or back pain  Neuro: No numbness, tingling, or weakness.  No headache  Psych: No SI or HI. Normal mood    All other symptoms reviewed and are negative, unless commented on in the HPI.     Past Medical History:  Past Medical History:   Diagnosis Date   . Arthropathy    . COPD (chronic obstructive pulmonary disease) (CMS HCC)    . Fibromyalgia    . Headache    . Thyroid disease      Past Surgical History:   Procedure Laterality Date   . Hx hysterectomy       Above history reviewed with patient.  Allergies, medication list, and old records also reviewed.     Filed Vitals:    04/04/21 1645 04/04/21 1700 04/04/21 1715 04/04/21 1730   BP: (!) 132/90 (!) 130/109 134/73 128/74   Pulse: 92 (!) 105 (!) 102 84   Resp: (!) 22 (!) 23 (!) 22 17   Temp:       SpO2: 96% 96% 96% 96%       Physical Exam:     Nursing note and vitals reviewed.  Vital signs reviewed as above. No acute distress.   Constitutional:  Patient is sitting  upright in bed no apparent distress.  Head: Normocephalic and atraumatic.   Eyes: Conjunctivae are normal.   Neck: Soft, supple, full range of motion.  No lymphadenopathy or thyromegaly.  Cardiovascular: RRR.  No Murmurs/rubs/gallops. Distal pulses present and equal bilaterally.  No pitting peripheral edema  Pulmonary/Chest:  Patient has a few scattered coarse breath sounds the generally clear with cough without respiratory distress.  Abdominal: Soft, nontender, nondistended.  No rebound, guarding, or masses.  Normal bowel sounds  Musculoskeletal: Normal range of motion. No deformities.  Exhibits no edema and no tenderness.   Neurological:  Patient alert and oriented x3.   No focal deficits noted.  Skin: Warm and dry. No rash or lesions  Psychiatric: Patient has a normal mood and affect.     Workup:     Labs:  Results for orders placed or performed during the hospital encounter of 04/04/21 (from the past 24 hour(s))   RAPID THROAT SCREEN, STREPTOCOCCUS, WITH REFLEX    Specimen: Throat; Swab   Result Value Ref Range    THROAT RAPID SCREEN, STREPTOCOCCUS  Negative Negative   COVID-19 - SCREENING - Placement (NON-PUI)    Specimen: Nasopharyngeal Swab   Result Value Ref Range    ADENOVIRUS ARRAY Not Detected Not Detected    CORONAVIRUS 229E Not Detected Not Detected    CORONAVIRUS HKU1 Not Detected Not Detected    CORONAVIRUS NL63 Not Detected Not Detected    CORONAVIRUS OC43 Not Detected Not Detected    METAPNEUMOVIRUS ARRAY Not Detected Not Detected    INFLUENZA A (NO SUBTYPE DETECTED) Not Detected Not Detected    INFLUENZA A H1 Not Detected Not Detected    INFLUENZA A H1 2009 Not Detected Not Detected    INFLUENZA A H3 Not Detected Not Detected    INFLUENZA B ARRAY Not Detected Not Detected    PARAINFLUENZA 1 ARRAY Not Detected Not Detected    PARAINFLUENZA 2 ARRAY Not Detected Not Detected    PARAINFLUENZA 3 ARRAY Not Detected Not Detected    PARAINFLUENZA 4 ARRAY Not Detected Not Detected    RSV ARRAY Not Detected  Not Detected    RHINOVIRUS/ENTEROVIRUS ARRAY Not Detected Not Detected    BORDETELLA PERTUSSIS ARRAY Not Detected Not Detected    CHLAMYDOPHILA PNEUMONIAE ARRAY Not Detected Not Detected    MYCOPLASMA PNEUMONIAE ARRAY Not Detected Not Detected    SARS CORONAVIRUS 2 (SARS-CoV-2) Not Detected Not Detected    Narrative    The QIAstat-Dx Respiratory Sars-CoV-2 Panel is a multiplexed nucleic acid real-time PCR test intended for the qualitative detection and differentiation of nucleic acid from multiple respiratory viral and bacterial organisms, including the SARS-CoV-2 virus, in nasopharyngeal swabs (NPS) eluted in universal transport media.   CBC/DIFF    Narrative    The following orders were created for panel order CBC/DIFF.  Procedure                               Abnormality         Status                     ---------                               -----------         ------                     CBC WITH IWLN[[id number]]                                    Final result                 Please view results for these tests on the individual orders.   COMPREHENSIVE METABOLIC PANEL, NON-FASTING   Result Value Ref Range    SODIUM 140 136 - 146 mmol/L    POTASSIUM 4.0 3.3 - 5.1 mmol/L    CHLORIDE 104 98 - 107 mmol/L    CO2 TOTAL 28 21 - 32 mmol/L    ANION GAP 8 6 - 15 mmol/L    BUN 5 (L) 7 - 21 mg/dL    CREATININE 0.69 (L) 0.80 - 1.60 mg/dL    ESTIMATED GFR >60 mL/min/1.34m^2    ALBUMIN 3.8 3.5 - 5.2 g/dL    CALCIUM 9.1 8.0 - 10.6 mg/dL    GLUCOSE 81 70 - 100 mg/dL  ALKALINE PHOSPHATASE 71 41 - 133 U/L    ALT (SGPT) 4 0 - 55 U/L    AST (SGOT) 8 5 - 34 U/L    BILIRUBIN TOTAL 0.6 0.2 - 1.2 mg/dL    PROTEIN TOTAL 6.4 6.4 - 8.3 g/dL    ALBUMIN/GLOBULIN RATIO 1.5 >=1.0    Narrative    Estimated Glomerular Filtration Rate (eGFR) is calculated using the CKD-EPI (2021) equation, intended for patients 20 years of age and older. If gender is not documented or "unknown", there will be no eGFR calculation.   MAGNESIUM   Result Value Ref  Range    MAGNESIUM 2.0 1.5 - 2.5 mg/dL   PT/INR   Result Value Ref Range    PROTHROMBIN TIME 12.0 9.0 - 13.0 seconds    INR 1.19 (H) 0.90 - 1.10    Narrative    INR Reference Range: 0.9-1.1  Moderate Intensity Warfarin Therapy: 2.0-3.0  Higher Intensity Warfarin Therapy:  2.5-3.5   PTT (PARTIAL THROMBOPLASTIN TIME)   Result Value Ref Range    APTT 28.8 23.0 - 32.0 seconds    Narrative    Therapeutic range for unfractionated heparin is 42-62 seconds.       TROPONIN I, HIGH SENSITIVITY   Result Value Ref Range    TROPONIN I, HIGH SENSITIVITY <2.00 (L) 2.00 - 20.00 pg/mL   CBC WITH DIFF   Result Value Ref Range    WBC 6.1 3.7 - 11.0 x10^3/uL    RBC 4.32 3.85 - 5.22 x10^6/uL    HGB 12.8 11.5 - 16.0 g/dL    HCT 38.8 34.8 - 46.0 %    MCV 89.8 78.0 - 100.0 fL    MCH 29.6 26.0 - 32.0 pg    MCHC 33.0 31.0 - 35.5 g/dL    RDW-CV 13.0 11.5 - 15.5 %    PLATELETS 259 150 - 400 x10^3/uL    MPV 9.8 8.7 - 12.5 fL    NEUTROPHIL % 55 %    LYMPHOCYTE % 34 %    MONOCYTE % 8 %    EOSINOPHIL % 2 %    BASOPHIL % 1 %    NEUTROPHIL # 3.32 1.50 - 7.70 x10^3/uL    LYMPHOCYTE # 2.09 1.00 - 4.80 x10^3/uL    MONOCYTE # 0.51 0.20 - 1.10 x10^3/uL    EOSINOPHIL # 0.10 <=0.50 x10^3/uL    BASOPHIL # <0.10 <=0.20 x10^3/uL    IMMATURE GRANULOCYTE % 0 0 - 1 %    IMMATURE GRANULOCYTE # <0.10 <0.10 x10^3/uL   URINALYSIS WITH REFLEX MICROSCOPIC AND CULTURE IF POSITIVE    Specimen: Urine, Site not specified    Narrative    The following orders were created for panel order URINALYSIS WITH REFLEX MICROSCOPIC AND CULTURE IF POSITIVE.  Procedure                               Abnormality         Status                     ---------                               -----------         ------                     URINALYSIS, [id number]/[id number]]  Normal              Final result                 Please view results for these tests on the individual orders.   URINALYSIS, MACRO/MICRO   Result Value Ref Range    COLOR Yellow Yellow    APPEARANCE Clear Clear     SPECIFIC GRAVITY 1.005 1.001 - 1.030    PH 7.0 5.0 - 9.0    PROTEIN Negative Negative mg/dL    GLUCOSE Negative Negative mg/dL    KETONES Negative Negative mg/dL    UROBILINOGEN 0.2  0.2 - 1.0 mg/dL    BILIRUBIN Negative Negative mg/dL    BLOOD Negative Negative mg/dL    NITRITE Negative Negative    LEUKOCYTES Negative Negative WBCs/uL       Imaging:         Orders Placed This Encounter   . RAPID THROAT SCREEN, STREPTOCOCCUS, WITH REFLEX   . COVID-19 - SCREENING - Placement (NON-PUI)   . GROUP A STREP CULTURE (UTN ONLY)   . XR AP MOBILE CHEST   . CBC/DIFF   . COMPREHENSIVE METABOLIC PANEL, NON-FASTING   . MAGNESIUM   . PT/INR   . PTT (PARTIAL THROMBOPLASTIN TIME)   . TROPONIN I, HIGH SENSITIVITY   . CANCELED: COVID-19 - SCREENING - Admission (NON-PUI)   . CBC WITH DIFF   . URINALYSIS WITH REFLEX MICROSCOPIC AND CULTURE IF POSITIVE   . URINALYSIS, MACRO/MICRO   . PULSE OXIMETRY - CONTINUOUS   . OXYGEN - NASAL CANNULA   . ECG 12 LEAD   . INSERT & MAINTAIN PERIPHERAL IV ACCESS       Abnormal Lab results:  Labs Reviewed   COMPREHENSIVE METABOLIC PANEL, NON-FASTING - Abnormal; Notable for the following components:       Result Value    BUN 5 (*)     CREATININE 0.69 (*)     All other components within normal limits    Narrative:     Estimated Glomerular Filtration Rate (eGFR) is calculated using the CKD-EPI (2021) equation, intended for patients 73 years of age and older. If gender is not documented or "unknown", there will be no eGFR calculation.   PT/INR - Abnormal; Notable for the following components:    INR 1.19 (*)     All other components within normal limits    Narrative:     INR Reference Range: 0.9-1.1  Moderate Intensity Warfarin Therapy: 2.0-3.0  Higher Intensity Warfarin Therapy:  2.5-3.5   TROPONIN I, HIGH SENSITIVITY - Abnormal; Notable for the following components:    TROPONIN I, HIGH SENSITIVITY <2.00 (*)     All other components within normal limits   RAPID THROAT SCREEN, STREPTOCOCCUS, WITH REFLEX -  Normal   RESPIRATORY VIRUS PANEL - Normal    Narrative:     The QIAstat-Dx Respiratory Sars-CoV-2 Panel is a multiplexed nucleic acid real-time PCR test intended for the qualitative detection and differentiation of nucleic acid from multiple respiratory viral and bacterial organisms, including the SARS-CoV-2 virus, in nasopharyngeal swabs (NPS) eluted in universal transport media.   MAGNESIUM - Normal   PTT (PARTIAL THROMBOPLASTIN TIME) - Normal    Narrative:     Therapeutic range for unfractionated heparin is 42-62 seconds.       URINALYSIS, MACRO/MICRO - Normal   GROUP A STREP CULTURE (UTN ONLY)   CBC/DIFF    Narrative:     The following orders were created  for panel order CBC/DIFF.  Procedure                               Abnormality         Status                     ---------                               -----------         ------                     CBC WITH ZOXW[[id number]]                                    Final result                 Please view results for these tests on the individual orders.   CBC WITH DIFF   URINALYSIS WITH REFLEX MICROSCOPIC AND CULTURE IF POSITIVE    Narrative:     The following orders were created for panel order URINALYSIS WITH REFLEX MICROSCOPIC AND CULTURE IF POSITIVE.  Procedure                               Abnormality         Status                     ---------                               -----------         ------                     URINALYSIS, MACRO/MICRO[[id number]]      Normal              Final result                 Please view results for these tests on the individual orders.       No results found for this visit on 04/04/21 (from the past 720 hour(s)).     Plan: Appropriate labs and imaging ordered. Medical Records reviewed.    MDM:   During the patient's stay in the emergency department, the above listed imaging and/or labs were performed to assist with medical decision making and were reviewed by myself when available for review.     Differential diagnosis  (includes but is not limited to):  Pneumonia, UTI, strep, viral syndrome, COVID    Pt remained stable throughout the emergency department course.      Impression and plan:  No evidence of COVID or pneumonia.  Negative viral panel.  Strep and urine are clear.  Patient is stable for discharge to home and can follow-up with the PCP      Critical care time: 0    Vania Rea, MD  04/04/2021, 16:50    This note was partially generated using MModal Fluency Direct system, and there may be some incorrect words, spellings, and punctuation that were not noted in checking the note before saving.

## 2021-04-06 LAB — GROUP A STREP CULTURE (UTN ONLY)

## 2021-04-18 DIAGNOSIS — M79672 Pain in left foot: Secondary | ICD-10-CM | POA: Diagnosis not present

## 2021-04-18 DIAGNOSIS — M2012 Hallux valgus (acquired), left foot: Secondary | ICD-10-CM | POA: Diagnosis not present

## 2021-04-18 DIAGNOSIS — M21622 Bunionette of left foot: Secondary | ICD-10-CM | POA: Diagnosis not present

## 2021-04-23 ENCOUNTER — Other Ambulatory Visit: Payer: Self-pay | Admitting: Internal Medicine

## 2021-05-06 ENCOUNTER — Encounter: Payer: Self-pay | Admitting: Internal Medicine

## 2021-05-06 DIAGNOSIS — K648 Other hemorrhoids: Secondary | ICD-10-CM

## 2021-05-16 ENCOUNTER — Encounter: Payer: Self-pay | Admitting: Internal Medicine

## 2021-05-16 ENCOUNTER — Ambulatory Visit (INDEPENDENT_AMBULATORY_CARE_PROVIDER_SITE_OTHER): Payer: BC Managed Care – PPO | Admitting: Internal Medicine

## 2021-05-16 ENCOUNTER — Other Ambulatory Visit: Payer: Self-pay

## 2021-05-16 VITALS — BP 150/95 | HR 57 | Temp 98.4°F | Resp 16 | Ht 68.0 in | Wt 190.5 lb

## 2021-05-16 DIAGNOSIS — E785 Hyperlipidemia, unspecified: Secondary | ICD-10-CM

## 2021-05-16 DIAGNOSIS — I10 Essential (primary) hypertension: Secondary | ICD-10-CM | POA: Diagnosis not present

## 2021-05-16 DIAGNOSIS — Z0000 Encounter for general adult medical examination without abnormal findings: Secondary | ICD-10-CM | POA: Diagnosis not present

## 2021-05-16 DIAGNOSIS — R739 Hyperglycemia, unspecified: Secondary | ICD-10-CM

## 2021-05-16 NOTE — Progress Notes (Signed)
Subjective:    Patient ID: Sheila Fuller, female    DOB: 20-Sep-1956, 65 y.o.   MRN: [id number]  DOS:  05/16/2021 Type of visit - description: CPX  Since the last office visit she is doing well. Had a reconstruction breast surgery revision. Having some occasional hemorrhoid problems.  Review of Systems  Other than above, a 14 point review of systems is negative     Past Medical History:  Diagnosis Date   Allergic rhinitis    Allergy    Anxiety    Arthritis    Asthma    seasonally   BRCA1 positive    Cancer ([place])    skin   Carcinoma in situ of perianal skin    +XRT & chemo 2000; no further f/u w/ specialist.    Cataract    will remove left end of 10-2016   Class 1 obesity    Depression    Family history of BRCA gene mutation    daughter BRCA1 +   Family history of breast cancer    Family history of pancreatic cancer    GERD (gastroesophageal reflux disease)    takes zantac maybe 2/week   Hyperlipidemia    Hypertension    Lactose intolerance 10/04/2013   Microhematuria 01/2006   saw Dr.Peterson, renal u/s: r pyelocaliectasis but CT was neg, cysto neg   Shortness of breath    only with asthma flare up   Sleep apnea    rarely uses cpap    Past Surgical History:  Procedure Laterality Date   ANTERIOR CERVICAL DECOMP/DISCECTOMY FUSION N/A 08/23/2014   Procedure: C5-6 Anterior Cervical Discectomy and Fusion, Allograft, Plate;  Surgeon: Marybelle Killings, MD;  Location: [facility];  Service: Orthopedics;  Laterality: N/A;   BREAST SURGERY  09/2018   BRCA-1 gene positive, +reconstruction   cervical cauterization     CESAREAN SECTION     GANGLION CYST EXCISION     MASTECTOMY Bilateral ~ 1/22020   NASAL SEPTUM SURGERY     POLYPECTOMY     Revision  breast reconstruction surgery 03/01/2019  02/2021   ROBOTIC ASSISTED BILATERAL SALPINGO OOPHERECTOMY Bilateral 11/24/2017   Procedure: XI ROBOTIC ASSISTED BILATERAL SALPINGO OOPHORECTOMY;  Surgeon: Everitt Amber, MD;  Location: [facility];   Service: Gynecology;  Laterality: Bilateral;   Social History   Socioeconomic History   Marital status: Married    Spouse name: Not on file   Number of children: 1   Years of education: Not on file   Highest education level: Not on file  Occupational History   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    Employer: [employer]  Tobacco Use   Smoking status: Former    Packs/day: 0.30    Years: 3.00    Pack years: 0.90    Types: Cigarettes   Smokeless tobacco: Never   Tobacco comments:    quit 04-2013 ([facility])  Vaping Use   Vaping Use: Never used  Substance and Sexual Activity   Alcohol use: Yes    Alcohol/week: 3.0 standard drinks    Types: 3 Standard drinks or equivalent per week    Comment: a week    Drug use: No   Sexual activity: Yes    Birth control/protection: Post-menopausal  Other Topics Concern   Not on file  Social History Narrative   remarried 10-11, 1 daughter    Social Determinants of Health   Financial Resource Strain: Not on file  Food Insecurity: Not on file  Transportation Needs:  Not on file  Physical Activity: Not on file  Stress: Not on file  Social Connections: Not on file  Intimate Partner Violence: Not on file    Allergies as of 05/16/2021       Reactions   Amlodipine Swelling, Other (See Comments)   Edema/swelling in legs.   Statins    REACTION: myalgia In 2010 pain she reported aches and pains with cholesterol atorvastatin. Tried Pravachol: Memory loss Tried  Zetia, question of diarrhea   Sulfonamide Derivatives Rash        Medication List        Accurate as of May 16, 2021 11:59 PM. If you have any questions, ask your nurse or doctor.          STOP taking these medications    cyclobenzaprine 5 MG tablet Commonly known as: FLEXERIL Stopped by: Kathlene November, MD       TAKE these medications    albuterol 108 (90 Base) MCG/ACT inhaler Commonly known as: ProAir HFA Inhale 2 puffs into the lungs every 4 (four) hours as  needed for wheezing or shortness of breath.   azelastine 0.1 % nasal spray Commonly known as: ASTELIN Place 2 sprays into both nostrils at bedtime. Use in each nostril as directed   buPROPion 150 MG 24 hr tablet Commonly known as: WELLBUTRIN XL Take 1 tablet (150 mg total) by mouth daily.   desloratadine 5 MG tablet Commonly known as: CLARINEX Take 5 mg by mouth daily as needed (for allergies.).   esomeprazole 20 MG capsule Commonly known as: NEXIUM Take 20 mg by mouth daily at 12 noon.   ezetimibe 10 MG tablet Commonly known as: ZETIA Take 1 tablet (10 mg total) by mouth daily.   hydrochlorothiazide 25 MG tablet Commonly known as: HYDRODIURIL Take 1 tablet (25 mg total) by mouth daily.   losartan 100 MG tablet Commonly known as: COZAAR Take 1 tablet (100 mg total) by mouth daily.   metoprolol tartrate 50 MG tablet Commonly known as: LOPRESSOR Take 1 tablet (50 mg total) by mouth daily.   naproxen 500 MG tablet Commonly known as: NAPROSYN Take 1 tablet (500 mg total) by mouth daily as needed for moderate pain.   psyllium 0.52 g capsule Commonly known as: REGULOID Take 0.52 g by mouth daily.           Objective:   Physical Exam BP (!) 150/95   Pulse (!) 57   Temp 98.4 F (36.9 C) (Oral)   Resp 16   Ht '5\' 8"'  (1.727 m)   Wt 190 lb 8 oz (86.4 kg)   SpO2 97%   BMI 28.97 kg/m  General: Well developed, NAD, BMI noted Neck: No  thyromegaly  HEENT:  Normocephalic . Face symmetric, atraumatic Lungs:  CTA B Normal respiratory effort, no intercostal retractions, no accessory muscle use. Heart: RRR,  no murmur.  Abdomen:  Not distended, soft, non-tender. No rebound or rigidity.   Lower extremities: no pretibial edema bilaterally  Skin: Exposed areas without rash. Not pale. Not jaundice Neurologic:  alert & oriented X3.  Speech normal, gait appropriate for age and unassisted Strength symmetric and appropriate for age.  Psych: Cognition and judgment  appear intact.  Cooperative with normal attention span and concentration.  Behavior appropriate. No anxious or depressed appearing.     Assessment    Assessment HTN Hyperlipidemia (statin intolerant) Anxiety, depression GERD Seasonal asthma Allergies Obesity : BMI~ 28 plus HTN, hyperlipidemia, OSA OSA: on Cpap consistently as off  04-2021 MSK: DJD, occasionally uses pain medication H/o Microhematuria 2007, Dr. Terance Hart, renal US showed right pyelocaliectasis, CT was negative, cystoscopy negative + BRCA: personal h/o (dx 2018)  and FH BRCA (daughter, sister).  See office visit 10/14/2017 Bilateral mastectomy 09/2018 H/o Carcinoma in situ, perianal skin, XRT, chemotherapy 2000. Not further follow-ups with specialist HOH: has hearing aids B   PLAN:  Here for CPX Prediabetes: Last A1c 6.1, explained the patient what prediabetes is, discussed diet HTN; BPelevated today, at home is normal.  I rechecked manually R arm: 150/95.  We agreed to continue losartan, HCT, metoprolol.  Check BPs, see AVS.  Call if not at goal Hyperlipidemia: On Zetia only statin intolerant.  Checking labs Anxiety depression: Controlled  OSA: Consistent use of Cpap + BRCA: Status post bilateral mastectomy, had a recent revision of breast reconstruction surgery.  Very happy about the results HOH: Since the last visit was Rx hearing aids. Hemorrhoids: Occasional problems, controlled with OTC, to see GI 07-2021. RTC 6 months.   This visit occurred during the SARS-CoV-2 public health emergency.  Safety protocols were in place, including screening questions prior to the visit, additional usage of staff PPE, and extensive cleaning of exam room while observing appropriate contact time as indicated for disinfecting solutions.

## 2021-05-16 NOTE — Patient Instructions (Signed)
Check your blood pressure twice weekly BP GOAL is between 110/65 and  135/85. If it is consistently higher or lower, let me know     [place] back for   fasting blood work at your earliest convenience  Come back for a 64-month checkup

## 2021-05-18 ENCOUNTER — Encounter: Payer: Self-pay | Admitting: Internal Medicine

## 2021-05-18 NOTE — Assessment & Plan Note (Signed)
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 - former smoker: See previous comments, declines CT chest for lung cancer screening.  Reportedly had negative genetic testing for lung cancer -Labs: CMP, FLP, CBC, A1c -Diet and exercise d/w pt  - POA on file

## 2021-05-18 NOTE — Assessment & Plan Note (Signed)
Here for CPX Prediabetes: Last A1c 6.1, explained the patient what prediabetes is, discussed diet HTN; BPelevated today, at home is normal.  I rechecked manually R arm: 150/95.  We agreed to continue losartan, HCT, metoprolol.  Check BPs, see AVS.  Call if not at goal Hyperlipidemia: On Zetia only statin intolerant.  Checking labs Anxiety depression: Controlled  OSA: Consistent use of Cpap + BRCA: Status post bilateral mastectomy, had a recent revision of breast reconstruction surgery.  Very happy about the results HOH: Since the last visit was Rx hearing aids. Hemorrhoids: Occasional problems, controlled with OTC, to see GI 07-2021. RTC 6 months.

## 2021-05-24 ENCOUNTER — Other Ambulatory Visit: Payer: Self-pay

## 2021-05-24 ENCOUNTER — Other Ambulatory Visit (INDEPENDENT_AMBULATORY_CARE_PROVIDER_SITE_OTHER): Payer: BC Managed Care – PPO

## 2021-05-24 DIAGNOSIS — R739 Hyperglycemia, unspecified: Secondary | ICD-10-CM

## 2021-05-24 DIAGNOSIS — I10 Essential (primary) hypertension: Secondary | ICD-10-CM | POA: Diagnosis not present

## 2021-05-24 DIAGNOSIS — E785 Hyperlipidemia, unspecified: Secondary | ICD-10-CM

## 2021-05-24 LAB — COMPREHENSIVE METABOLIC PANEL
ALT: 15 U/L (ref 0–35)
AST: 15 U/L (ref 0–37)
Albumin: 4.2 g/dL (ref 3.5–5.2)
Alkaline Phosphatase: 67 U/L (ref 39–117)
BUN: 16 mg/dL (ref 6–23)
CO2: 29 mEq/L (ref 19–32)
Calcium: 9.2 mg/dL (ref 8.4–10.5)
Chloride: 105 mEq/L (ref 96–112)
Creatinine, Ser: 0.87 mg/dL (ref 0.40–1.20)
GFR: 70.43 mL/min (ref >60.00)
Glucose, Bld: 95 mg/dL (ref 70–99)
Potassium: 4 mEq/L (ref 3.5–5.1)
Sodium: 142 mEq/L (ref 135–145)
Total Bilirubin: 0.5 mg/dL (ref 0.2–1.2)
Total Protein: 6.7 g/dL (ref 6.0–8.3)

## 2021-05-24 LAB — CBC WITH DIFFERENTIAL/PLATELET
Basophils Absolute: 0 10*3/uL (ref 0.0–0.1)
Basophils Relative: 0.8 % (ref 0.0–3.0)
Eosinophils Absolute: 0.1 10*3/uL (ref 0.0–0.7)
Eosinophils Relative: 2 % (ref 0.0–5.0)
HCT: 43.1 % (ref 36.0–46.0)
Hemoglobin: 14.6 g/dL (ref 12.0–15.0)
Lymphocytes Relative: 30.4 % (ref 12.0–46.0)
Lymphs Abs: 1.7 10*3/uL (ref 0.7–4.0)
MCHC: 33.8 g/dL (ref 30.0–36.0)
MCV: 90.6 fl (ref 78.0–100.0)
Monocytes Absolute: 0.3 10*3/uL (ref 0.1–1.0)
Monocytes Relative: 5.8 % (ref 3.0–12.0)
Neutro Abs: 3.5 10*3/uL (ref 1.4–7.7)
Neutrophils Relative %: 61 % (ref 43.0–77.0)
Platelets: 222 10*3/uL (ref 150.0–400.0)
RBC: 4.75 Mil/uL (ref 3.87–5.11)
RDW: 13.5 % (ref 11.5–15.5)
WBC: 5.7 10*3/uL (ref 4.0–10.5)

## 2021-05-24 LAB — LIPID PANEL
Cholesterol: 235 mg/dL — ABNORMAL HIGH (ref 0–200)
HDL: 58.5 mg/dL (ref >39.00)
LDL Cholesterol: 139 mg/dL — ABNORMAL HIGH (ref 0–99)
NonHDL: 176.78
Total CHOL/HDL Ratio: 4
Triglycerides: 187 mg/dL — ABNORMAL HIGH (ref 0.0–149.0)
VLDL: 37.4 mg/dL (ref 0.0–40.0)

## 2021-05-24 LAB — HEMOGLOBIN A1C: Hgb A1c MFr Bld: 6.1 % (ref 4.6–6.5)

## 2021-06-07 ENCOUNTER — Other Ambulatory Visit: Payer: Self-pay

## 2021-06-07 ENCOUNTER — Emergency Department (HOSPITAL_COMMUNITY): Payer: Medicaid Other

## 2021-06-07 ENCOUNTER — Emergency Department
Admission: EM | Admit: 2021-06-07 | Discharge: 2021-06-07 | Discharge status: Against Medical Advice | Payer: Medicaid Other | Attending: Emergency Medicine | Admitting: Emergency Medicine

## 2021-06-07 ENCOUNTER — Encounter (HOSPITAL_COMMUNITY): Payer: Self-pay

## 2021-06-07 DIAGNOSIS — J449 Chronic obstructive pulmonary disease, unspecified: Secondary | ICD-10-CM | POA: Insufficient documentation

## 2021-06-07 DIAGNOSIS — M79605 Pain in left leg: Secondary | ICD-10-CM | POA: Insufficient documentation

## 2021-06-07 DIAGNOSIS — I499 Cardiac arrhythmia, unspecified: Secondary | ICD-10-CM

## 2021-06-07 DIAGNOSIS — Z5329 Procedure and treatment not carried out because of patient's decision for other reasons: Secondary | ICD-10-CM | POA: Insufficient documentation

## 2021-06-07 DIAGNOSIS — Z90710 Acquired absence of both cervix and uterus: Secondary | ICD-10-CM

## 2021-06-07 DIAGNOSIS — K56600 Partial intestinal obstruction, unspecified as to cause: Secondary | ICD-10-CM | POA: Insufficient documentation

## 2021-06-07 DIAGNOSIS — R110 Nausea: Secondary | ICD-10-CM | POA: Insufficient documentation

## 2021-06-07 DIAGNOSIS — Z880 Allergy status to penicillin: Secondary | ICD-10-CM

## 2021-06-07 DIAGNOSIS — R109 Unspecified abdominal pain: Secondary | ICD-10-CM

## 2021-06-07 DIAGNOSIS — F17210 Nicotine dependence, cigarettes, uncomplicated: Secondary | ICD-10-CM | POA: Insufficient documentation

## 2021-06-07 LAB — URINALYSIS, MACRO/MICRO
BILIRUBIN: NEGATIVE mg/dL
BLOOD: NEGATIVE mg/dL
GLUCOSE: NEGATIVE mg/dL
KETONES: 15 mg/dL — AB
LEUKOCYTES: NEGATIVE WBCs/uL
NITRITE: NEGATIVE
PH: 6 (ref 5.0–9.0)
PROTEIN: NEGATIVE mg/dL
SPECIFIC GRAVITY: 1.009 (ref 1.001–1.030)
UROBILINOGEN: 0.2 mg/dL (ref 0.2–1.0)

## 2021-06-07 LAB — COMPREHENSIVE METABOLIC PANEL, NON-FASTING
ALBUMIN/GLOBULIN RATIO: 1.7 (ref >=1.0)
ALBUMIN: 4.2 g/dL (ref 3.5–5.2)
ALKALINE PHOSPHATASE: 57 U/L (ref 41–133)
ALT (SGPT): 4 U/L (ref 0–55)
ANION GAP: 10 mmol/L (ref 6–15)
AST (SGOT): 9 U/L (ref 5–34)
BILIRUBIN TOTAL: 1.6 mg/dL — ABNORMAL HIGH (ref 0.2–1.2)
BUN: 8 mg/dL (ref 7–21)
CALCIUM: 9.1 mg/dL (ref 8.0–10.6)
CHLORIDE: 103 mmol/L (ref 98–107)
CO2 TOTAL: 26 mmol/L (ref 21–32)
CREATININE: 0.67 mg/dL — ABNORMAL LOW (ref 0.80–1.60)
ESTIMATED GFR: >60 mL/min/{1.73_m2}
GLUCOSE: 82 mg/dL (ref 70–100)
POTASSIUM: 3.9 mmol/L (ref 3.3–5.1)
PROTEIN TOTAL: 6.7 g/dL (ref 6.4–8.3)
SODIUM: 139 mmol/L (ref 136–146)

## 2021-06-07 LAB — CBC WITH DIFF
BASOPHIL #: <0.1 10*3/uL (ref <=0.20)
BASOPHIL %: 1 %
EOSINOPHIL #: <0.1 10*3/uL (ref <=0.50)
EOSINOPHIL %: 1 %
HCT: 43.9 % (ref 34.8–46.0)
HGB: 14.2 g/dL (ref 11.5–16.0)
IMMATURE GRANULOCYTE #: <0.1 10*3/uL (ref <0.10)
IMMATURE GRANULOCYTE %: 0 % (ref 0–1)
LYMPHOCYTE #: 2.13 10*3/uL (ref 1.00–4.80)
LYMPHOCYTE %: 41 %
MCH: 29.8 pg (ref 26.0–32.0)
MCHC: 32.3 g/dL (ref 31.0–35.5)
MCV: 92.2 fL (ref 78.0–100.0)
MONOCYTE #: 0.44 10*3/uL (ref 0.20–1.10)
MONOCYTE %: 9 %
MPV: 9.9 fL (ref 8.7–12.5)
NEUTROPHIL #: 2.51 10*3/uL (ref 1.50–7.70)
NEUTROPHIL %: 48 %
PLATELETS: 201 10*3/uL (ref 150–400)
RBC: 4.76 10*6/uL (ref 3.85–5.22)
RDW-CV: 14.3 % (ref 11.5–15.5)
WBC: 5.2 10*3/uL (ref 3.7–11.0)

## 2021-06-07 LAB — TROPONIN-I, HIGH SENSITIVITY: TROPONIN I, HIGH SENSITIVITY: <2 pg/mL — ABNORMAL LOW (ref 2.00–20.00)

## 2021-06-07 LAB — LIPASE: LIPASE: 10 U/L (ref 8–78)

## 2021-06-07 LAB — LACTIC ACID LEVEL: LACTIC ACID: 0.7 mmol/L (ref 0.5–2.0)

## 2021-06-07 LAB — PT/INR
INR: 1.15 — ABNORMAL HIGH (ref 0.90–1.10)
PROTHROMBIN TIME: 11.6 seconds (ref 9.0–13.0)

## 2021-06-07 LAB — MAGNESIUM: MAGNESIUM: 1.9 mg/dL (ref 1.5–2.5)

## 2021-06-07 LAB — PTT (PARTIAL THROMBOPLASTIN TIME): APTT: 27.4 seconds (ref 23.0–32.0)

## 2021-06-07 MED ORDER — MORPHINE 4 MG/ML INJECTION SYRINGE
4.0000 mg | INJECTION | INTRAMUSCULAR | Status: DC
Start: 2021-06-07 — End: 2021-06-07
  Administered 2021-06-07: 0 mg via INTRAVENOUS
  Filled 2021-06-07: qty 1

## 2021-06-07 MED ORDER — SODIUM CHLORIDE 0.9 % IV BOLUS
1000.0000 mL | INJECTION | Status: AC
Start: 2021-06-07 — End: 2021-06-07
  Administered 2021-06-07: 1000 mL via INTRAVENOUS
  Administered 2021-06-07: 0 mL via INTRAVENOUS

## 2021-06-07 MED ORDER — ONDANSETRON 4 MG DISINTEGRATING TABLET
4.0000 mg | ORAL_TABLET | Freq: Three times a day (TID) | ORAL | 0 refills | Status: AC | PRN
Start: 2021-06-07 — End: ?

## 2021-06-07 MED ORDER — ONDANSETRON HCL (PF) 4 MG/2 ML INJECTION SOLUTION
4.0000 mg | INTRAMUSCULAR | Status: AC
Start: 2021-06-07 — End: 2021-06-07
  Administered 2021-06-07: 4 mg via INTRAVENOUS
  Filled 2021-06-07: qty 2

## 2021-06-07 MED ORDER — IOPAMIDOL 300 MG IODINE/ML (61 %) INTRAVENOUS SOLUTION
50.0000 mL | INTRAVENOUS | Status: AC
Start: 2021-06-07 — End: 2021-06-07
  Administered 2021-06-07: 50 mL via INTRAVENOUS

## 2021-06-07 NOTE — ED Nurses Note (Signed)
I spoke with Dr. Ninfa Linden, he does not want the patient to leave until scan is back.  She calling her daughter at this time to let her know.  Pt does not want to sign out AMA

## 2021-06-07 NOTE — ED Nurses Note (Signed)
Pt states abd feels better, states that she has continued L leg pain from sciatica pain.

## 2021-06-07 NOTE — ED Provider Notes (Signed)
[facility] - Emergency Department      Attending Physician: Dr. Hetty BlendAfaq Ahmed  CC:  Chief Complaint   Patient presents with   . Abdominal Pain   . Nausea   . Leg Pain       HPI:  Cindy Willis is a 65 y.o. female who presents to the Emergency Room with c/o abdominal pain and she [name] pain.  The patient said that she has sciatica in her left lower extremity it has been bothering for last 1 and half weeks.  It hurts more when she walks around.  The patient said she also have lower abdominal pain for last 3 days.  He is nauseated but no vomiting no diarrhea she has a hysterectomy in the past no other abdominal surgery.  She denies frequency dysuria burning.  The no chest pain or shortness of breath.    Review of Systems:  Constitutional:  Constitutional symptoms negative except what describe in history.  Skin:  Negative except what Idescribed in his history.  HENT:Negative except what I described in his history.  Eyes: Negative except what a described in his history.  Cardio: Negative except what I described in his history.  Respiratory:Negative except what I described in his history.  GI: Negative except what I a described in his history.  GU: Negative except what I described in his history.  MSK: Negative except what I described in his history.  Neuro: Negative except what I described in his history.   All other systems reviewed and are negative or as noted in HPI.    History:  PMH:    Past Medical History:   Diagnosis Date   . Arthropathy    . COPD (chronic obstructive pulmonary disease) (CMS HCC)    . Fibromyalgia    . Headache    . Thyroid disease          Previous Medications    ALBUTEROL SULFATE (PROVENTIL OR VENTOLIN OR PROAIR) 90 MCG/ACTUATION INHALATION HFA AEROSOL INHALER    Take 1-2 Puffs by inhalation Every 6 hours as needed    CHOLECALCIFEROL, VITAMIN D3, 250 MCG (10,000 UNIT) ORAL CAPSULE    Take 10,000 Units by mouth Once a day    LEVOTHYROXINE (SYNTHROID) 50 MCG ORAL TABLET    Take 50 mcg by mouth  Every morning    METHYLPREDNISOLONE (MEDROL DOSEPACK) 4 MG ORAL TABLETS, DOSE PACK    Take as instructed.       PSH:    Past Surgical History:   Procedure Laterality Date   . HX HYSTERECTOMY       .  Small-bowel obstruction    Social Hx:    Social History     Socioeconomic History   . Marital status: Single     Spouse name: Not on file   . Number of children: Not on file   . Years of education: Not on file   . Highest education level: Not on file   Occupational History   . Not on file   Tobacco Use   . Smoking status: Current Every Day Smoker     Packs/day: 1.00   . Smokeless tobacco: Never Used   Vaping Use   . Vaping Use: Never used   Substance and Sexual Activity   . Alcohol use: Yes     Comment: SOCIAL   . Drug use: Never   . Sexual activity: Not on file   Other Topics Concern   . Not on file   Social History  Narrative   . Not on file     Social Determinants of Health     Financial Resource Strain: Not on file   Food Insecurity: Not on file   Transportation Needs: Not on file   Physical Activity: Not on file   Stress: Not on file   Intimate Partner Violence: Not on file   Housing Stability: Not on file     Family Hx: No family history on file.  Allergies:   Allergies   Allergen Reactions   . Bacitracin Rash   . Penicillins Rash   . Ampicillin    . Baclofen  Other Adverse Reaction (Add comment)     FELT WIERD       Above history reviewed with patient, changes are as documented.    Physical Exam:   Nursing note and vitals reviewed.  ED Triage Vitals [06/07/21 1420]   BP (Non-Invasive) 131/81   Heart Rate (!) 105   Respiratory Rate 18   Temperature 36.9 C (98.4 F)   SpO2 96 %   Weight 46.1 kg (101 lb 10.1 oz)   Height 1.6 m (5\' 3" )       Constitutional: Pt is alert and oriented and appears well-developed and well-nourished. No acute distress.   Head.  Atraumatic.  Pupils.  Equal and reacting to light.  HENT:   Mouth/Throat: Oropharynx is clear and moist.  Tonsils not enlarged no inflammation.  Eyes: Conjunctivae  and extraocular motions are normal. Pupils are equal, round, and reactive to light.   Neck: Normal range of motion. Neck supple.  No JVDs or bruits trachea midline  Cardiovascular:  Heart S1-S2 regular no gallop or murmur.  Normal rate, regular rhythm and intact distal pulses.    Pulmonary/Chest: Effort normal. No respiratory distress. Breath sounds normal.  Lungs are clear no rales rhonchi or wheezing.  Abdominal: Abdomen is soft, nontender, nondistended.  No rebound or guarding noted.  Bowel sounds are present.  Liver spleen and kidneys are not palpable.  Musculoskeletal: Normal range of motion.  Examination of lower extremities shows no edema.  Neurological: Pt is alert and oriented. Grossly intact. Moving all extremities. Facies symmetric.  No motor or sensory deficits.  Skin: Skin is warm and dry without rash.  Color normal.      Course:   Impression: Pt presenting c/o   Chief Complaint   Patient presents with   . Abdominal Pain   . Nausea   . Leg Pain       Plan:Will obtain the following labs/imaging and give patient the following medications to alleviate symptoms:  Orders Placed This Encounter   . XR AP MOBILE CHEST   . CT ABDOMEN PELVIS W IV CONTRAST   . CBC/DIFF   . COMPREHENSIVE METABOLIC PANEL, NON-FASTING   . LIPASE   . MAGNESIUM   . URINALYSIS WITH REFLEX MICROSCOPIC AND CULTURE IF POSITIVE   . PT/INR   . PTT (PARTIAL THROMBOPLASTIN TIME)   . LACTIC ACID LEVEL   . CBC WITH DIFF   . URINALYSIS, MACRO/MICRO   . TROPONIN I, HIGH SENSITIVITY   . EXTRA TUBES   . RED TOP TUBE   . ECG 12 LEAD   . NS bolus infusion 1,000 mL   . morphine 4 mg/mL injection   . ondansetron (ZOFRAN) 2 mg/mL injection   . iopamidol (ISOVUE-300) 61% infusion   . ondansetron (ZOFRAN ODT) 4 mg Oral Tablet, Rapid Dissolve     Laboratory Results:    Results for  orders placed or performed during the hospital encounter of 06/07/21 (from the past 12 hour(s))   COMPREHENSIVE METABOLIC PANEL, NON-FASTING   Result Value Ref Range    SODIUM 139  136 - 146 mmol/L    POTASSIUM 3.9 3.3 - 5.1 mmol/L    CHLORIDE 103 98 - 107 mmol/L    CO2 TOTAL 26 21 - 32 mmol/L    ANION GAP 10 6 - 15 mmol/L    BUN 8 7 - 21 mg/dL    CREATININE 1.61 (L) 0.80 - 1.60 mg/dL    ESTIMATED GFR >09 UE/AVW/0.98J^1    ALBUMIN 4.2 3.5 - 5.2 g/dL    CALCIUM 9.1 8.0 - 91.4 mg/dL    GLUCOSE 82 70 - 782 mg/dL    ALKALINE PHOSPHATASE 57 41 - 133 U/L    ALT (SGPT) 4 0 - 55 U/L    AST (SGOT) 9 5 - 34 U/L    BILIRUBIN TOTAL 1.6 (H) 0.2 - 1.2 mg/dL    PROTEIN TOTAL 6.7 6.4 - 8.3 g/dL    ALBUMIN/GLOBULIN RATIO 1.7 >=1.0   LIPASE   Result Value Ref Range    LIPASE 10 8 - 78 U/L   MAGNESIUM   Result Value Ref Range    MAGNESIUM 1.9 1.5 - 2.5 mg/dL   PT/INR   Result Value Ref Range    PROTHROMBIN TIME 11.6 9.0 - 13.0 seconds    INR 1.15 (H) 0.90 - 1.10   PTT (PARTIAL THROMBOPLASTIN TIME)   Result Value Ref Range    APTT 27.4 23.0 - 32.0 seconds   LACTIC ACID LEVEL   Result Value Ref Range    LACTIC ACID 0.7 0.5 - 2.0 mmol/L   CBC WITH DIFF   Result Value Ref Range    WBC 5.2 3.7 - 11.0 x10^3/uL    RBC 4.76 3.85 - 5.22 x10^6/uL    HGB 14.2 11.5 - 16.0 g/dL    HCT 95.6 21.3 - 08.6 %    MCV 92.2 78.0 - 100.0 fL    MCH 29.8 26.0 - 32.0 pg    MCHC 32.3 31.0 - 35.5 g/dL    RDW-CV 57.8 46.9 - 62.9 %    PLATELETS 201 150 - 400 x10^3/uL    MPV 9.9 8.7 - 12.5 fL    NEUTROPHIL % 48 %    LYMPHOCYTE % 41 %    MONOCYTE % 9 %    EOSINOPHIL % 1 %    BASOPHIL % 1 %    NEUTROPHIL # 2.51 1.50 - 7.70 x10^3/uL    LYMPHOCYTE # 2.13 1.00 - 4.80 x10^3/uL    MONOCYTE # 0.44 0.20 - 1.10 x10^3/uL    EOSINOPHIL # <0.10 <=0.50 x10^3/uL    BASOPHIL # <0.10 <=0.20 x10^3/uL    IMMATURE GRANULOCYTE % 0 0 - 1 %    IMMATURE GRANULOCYTE # <0.10 <0.10 x10^3/uL   URINALYSIS, MACRO/MICRO   Result Value Ref Range    COLOR Yellow Yellow    APPEARANCE Clear Clear    SPECIFIC GRAVITY 1.009 1.001 - 1.030    PH 6.0 5.0 - 9.0    PROTEIN Negative Negative mg/dL    GLUCOSE Negative Negative mg/dL    KETONES 15  (A) Negative mg/dL    UROBILINOGEN 0.2   0.2 - 1.0 mg/dL    BILIRUBIN Negative Negative mg/dL    BLOOD Negative Negative mg/dL    NITRITE Negative Negative    LEUKOCYTES Negative Negative WBCs/uL   TROPONIN I, HIGH SENSITIVITY  Result Value Ref Range    TROPONIN I, HIGH SENSITIVITY <2.00 (L) 2.00 - 20.00 pg/mL        Radiographical Imaging:   Results for orders placed or performed during the hospital encounter of 06/07/21   XR AP MOBILE CHEST     Status: None    Narrative    INDICATION:  Shortness of breath, abdominal pain, nausea. History of COPD.    TECHNIQUE:  Frontal chest was obtained at 1801 hours.    COMPARISON:  04/04/2021 XR Chest 1 View Portable.    FINDINGS:    The cardiomediastinal silhouette is normal in size.  Again seen is hyperinflation or chronic obstructive pulmonary disease with some scarring in the right apex. There is no edema or consolidation and no change from the prior chest x-ray..  There are no pleural effusions.  There is no pneumothorax.  No acute osseous process.        Impression    Hyperinflation or chronic obstructive pulmonary disease. There is some scarring in the right apex but there is no edema or consolidation in the lungs..        Signed by Lysle Morales, MD   CT ABDOMEN PELVIS W IV CONTRAST     Status: None    Narrative    INDICATION:  Lower abdominal pain.  Nausea.    TECHNIQUE:  CT of the abdomen and pelvis was performed with intravenous contrast.  Isovue 300 100 mL was administered intravenously.      Automated mA/kV exposure control was utilized and patient examination was performed in strict accordance with principles of ALARA.    RADIATION AMOUNT:  222.6 mGy-cm.    COMPARISON:  None Available.    FINDINGS:   Abdomen:  The lung bases are clear.  The liver is normal.  The pancreas, spleen, adrenal glands, and kidneys demonstrate no acute pathology.  There is gallbladder sludge.  There are multiple bilateral renal cysts.    There is no free air or lymph node enlargement.  Abdominal aorta is not aneurysmal.  There  is gastric fold thickening, may represent gastritis.    Pelvis:  There is dilatation of the small bowel loop in the left upper abdomen with air-fluid level, proximal to the surgical suture with narrowing of the bowel loops distally (series 5, image 12) and swirling of the vessels in the mid left abdomen (series 5, image 11-16 and series 3, image 44), likely representing internal hernia and partial bowel obstruction.  Continued follow-up to evaluate resolution or progression high-grade bowel obstruction.  There is no pneumatosis or significant decrease in bowel wall enhancement suggestive of ischemic colitis.  There are colonic diverticula without evidence of acute diverticulitis.  The appendix is normal in size and contour.  There is small pelvic free fluid.  Lymph nodes are not enlarged.  Urinary bladder is unremarkable.  There is a small urinary bladder diverticulum.    Skeleton:    There are no acute fractures.  No suspicious bony lesions.      Impression    Dilatation of the small bowel loop in the left upper abdomen with air-fluid level, proximal to the surgical suture with narrowing of the bowel loops distally and swirling of the vessels in the mid left abdomen, likely representing internal hernia and partial bowel obstruction. Continued follow-up to evaluate resolution or progression high-grade bowel obstruction.    No pneumatosis or significant decrease in bowel wall enhancement suggestive of ischemic colitis.    Small pelvic free fluid.  Gastric fold thickening, may represent gastritis.      Signed by Vickki Hearing, MD       EKG- normal sinus rhythm sinus arrhythmia.  Heart rate is 73 per minute.      All labs were reviewed. Medical Records reviewed.     MDM/Course:  Visit Vitals  Vitals:    06/07/21 2000 06/07/21 2015 06/07/21 2030 06/07/21 2045   BP: (!) 143/74      Pulse:       Resp:       Temp:       SpO2:  97% 96% 96%   Weight:       Height:       BMI:                  Disposition: AMA    Clinical  Impression:   Encounter Diagnoses   Name Primary?   . Abdominal pain, unspecified abdominal location Yes   . Partial small bowel obstruction (CMS HCC)      Follow Up: Lowry Ram, MD  [address]  [place] [address]  [phone]    In 1 day      Medications Prescribed:   New Prescriptions    ONDANSETRON (ZOFRAN ODT) 4 MG ORAL TABLET, RAPID DISSOLVE    Take 1 Tablet (4 mg total) by mouth Every 8 hours as needed for Nausea/Vomiting   Plan and treatment.  Patient given IV fluids.  She was given 4 mg morphine and 4 mg of Zofran.  Her symptoms are improved.  CT scan of the abdomen pelvis showing partial small-bowel obstruction.  I went and discussed results with her and I a recommended the admission.  The she said she just cannot stay tonight but she will come back tomorrow.  I explained that the his symptoms may get worse and that can kill you also.  She understands that but she said she just cannot stay tonight therefore she signed against medical advise.  I sent the prescription of Zofran to her pharmacy and told her not to eat or drink overnight.  I told that must come back to the emergency room tomorrow and if you cannot come back 10 follow up with doctor when ache I gave her Dr. Lady Saucier office number for follow-up also.      Emeline Gins, MD 06/07/2021, 21:33

## 2021-06-07 NOTE — ED Triage Notes (Signed)
PT STATES ABD PAIN, N, LEFT LEG PAIN X 3 DAYS

## 2021-06-07 NOTE — ED Nurses Note (Signed)
Dr. Ninfa Linden spoke with the patient. Pt does not want to stay, states she knows she has a narrowing was told this by her own physician in [place], but just moved here.  Pt has signed AMA form then ambulated out of the ER without issue.  Pt's daughter is picking her up.

## 2021-06-07 NOTE — ED Nurses Note (Signed)
Pt called out, states that she has to call her daughter for a ride and she leaves for work at 9:30pm

## 2021-06-08 LAB — RED TOP TUBE

## 2021-06-08 LAB — ECG 12 LEAD
Atrial Rate: 73 {beats}/min
Calculated P Axis: 83 degrees
Calculated R Axis: 73 degrees
Calculated T Axis: 84 degrees
PR Interval: 142 ms
QRS Duration: 92 ms
QT Interval: 370 ms
QTC Calculation: 407 ms
Ventricular rate: 73 {beats}/min

## 2021-06-10 DIAGNOSIS — Z9013 Acquired absence of bilateral breasts and nipples: Secondary | ICD-10-CM | POA: Diagnosis not present

## 2021-06-10 DIAGNOSIS — Z1509 Genetic susceptibility to other malignant neoplasm: Secondary | ICD-10-CM | POA: Diagnosis not present

## 2021-06-10 DIAGNOSIS — Z09 Encounter for follow-up examination after completed treatment for conditions other than malignant neoplasm: Secondary | ICD-10-CM | POA: Diagnosis not present

## 2021-06-10 DIAGNOSIS — Z1501 Genetic susceptibility to malignant neoplasm of breast: Secondary | ICD-10-CM | POA: Diagnosis not present

## 2021-06-10 DIAGNOSIS — Z803 Family history of malignant neoplasm of breast: Secondary | ICD-10-CM | POA: Diagnosis not present

## 2021-06-10 DIAGNOSIS — Z800 Family history of malignant neoplasm of digestive organs: Secondary | ICD-10-CM | POA: Diagnosis not present

## 2021-06-18 ENCOUNTER — Other Ambulatory Visit: Payer: Self-pay | Admitting: Internal Medicine

## 2021-06-28 ENCOUNTER — Other Ambulatory Visit: Payer: Self-pay | Admitting: Internal Medicine

## 2021-07-10 ENCOUNTER — Encounter: Payer: Self-pay | Admitting: Obstetrics & Gynecology

## 2021-07-18 ENCOUNTER — Ambulatory Visit (INDEPENDENT_AMBULATORY_CARE_PROVIDER_SITE_OTHER): Payer: Self-pay | Admitting: Surgery

## 2021-07-19 ENCOUNTER — Encounter: Payer: Self-pay | Admitting: Internal Medicine

## 2021-07-19 DIAGNOSIS — Z8349 Family history of other endocrine, nutritional and metabolic diseases: Secondary | ICD-10-CM

## 2021-07-19 DIAGNOSIS — I10 Essential (primary) hypertension: Secondary | ICD-10-CM

## 2021-07-20 ENCOUNTER — Other Ambulatory Visit: Payer: Self-pay | Admitting: Internal Medicine

## 2021-07-20 MED ORDER — CHLORTHALIDONE 25 MG PO TABS: 25.0000 mg | ORAL_TABLET | Freq: Every day | ORAL | 0 refills | Status: DC

## 2021-07-22 ENCOUNTER — Other Ambulatory Visit: Payer: Self-pay | Admitting: Internal Medicine

## 2021-07-23 NOTE — Addendum Note (Signed)
Addended byDamita Dunnings D on: 07/23/2021 07:44 AM   Modules accepted: Orders

## 2021-08-01 ENCOUNTER — Ambulatory Visit (INDEPENDENT_AMBULATORY_CARE_PROVIDER_SITE_OTHER): Payer: Self-pay | Admitting: Surgery

## 2021-08-02 ENCOUNTER — Encounter: Payer: Self-pay | Admitting: Gastroenterology

## 2021-08-02 ENCOUNTER — Ambulatory Visit (INDEPENDENT_AMBULATORY_CARE_PROVIDER_SITE_OTHER): Payer: BC Managed Care – PPO | Admitting: Gastroenterology

## 2021-08-02 DIAGNOSIS — D126 Benign neoplasm of colon, unspecified: Secondary | ICD-10-CM | POA: Diagnosis not present

## 2021-08-02 DIAGNOSIS — C210 Malignant neoplasm of anus, unspecified: Secondary | ICD-10-CM

## 2021-08-02 NOTE — Progress Notes (Signed)
Review of pertinent gastrointestinal problems: 1.  History of adenomatous colon polyps.  Colonoscopy 2007 8 mm tubulovillous adenoma.  Colonoscopy 2012 3 polyps were removed one was a tubular adenoma and one was a sessile serrated adenoma.  Colonoscopy December 2017 found for subcentimeter polyps.  Diverticulosis was also noted.  Hemorrhoids were not described.  Pathology pathology showed these were mixed tubular adenomas and hyperplastic polyps. 2.  History of anal cancer (in situ), 2000, status post chemo and XRT.   HPI: This is a very pleasant 65 year old woman  Blood work July 2022 shows normal complete metabolic profile, normal CBC  She has had about 2 years of intermittent anal discomfort with itching, anal discomfort, minor bleeding.  This happens about twice per month.  She will treat this with which hazel wipes and sitz bath's.  She has no significant need to push or strain when she moves her bowels but she says her BMs can be quite big and hard and sometimes she does feel a tearing type sensation when she moves her bowels.  She had chemo and radiation therapy, [facility] for anal cancer and was told she was cured.  She does recall that at the time of diagnosis her symptoms were somewhat similar to her current symptoms   ROS: complete GI ROS as described in HPI, all other review negative.  Constitutional:  No unintentional weight loss   Past Medical History:  Diagnosis Date   Allergic rhinitis    Allergy    Anxiety    Arthritis    Asthma    seasonally   BRCA1 positive    Cancer ([place])    skin   Carcinoma in situ of perianal skin    +XRT & chemo 2000; no further f/u w/ specialist.    Cataract    will remove left end of 10-2016   Class 1 obesity    Depression    Family history of BRCA gene mutation    daughter BRCA1 +   Family history of breast cancer    Family history of pancreatic cancer    GERD (gastroesophageal reflux disease)    takes zantac maybe 2/week    Hyperlipidemia    Hypertension    Lactose intolerance 10/04/2013   Microhematuria 01/2006   saw Dr.Peterson, renal u/s: r pyelocaliectasis but CT was neg, cysto neg   Shortness of breath    only with asthma flare up   Sleep apnea    rarely uses cpap    Past Surgical History:  Procedure Laterality Date   ANTERIOR CERVICAL DECOMP/DISCECTOMY FUSION N/A 08/23/2014   Procedure: C5-6 Anterior Cervical Discectomy and Fusion, Allograft, Plate;  Surgeon: Marybelle Killings, MD;  Location: [facility];  Service: Orthopedics;  Laterality: N/A;   BREAST SURGERY  09/2018   BRCA-1 gene positive, +reconstruction   cervical cauterization     CESAREAN SECTION     GANGLION CYST EXCISION     MASTECTOMY Bilateral ~ 1/22020   NASAL SEPTUM SURGERY     POLYPECTOMY     Revision  breast reconstruction surgery 03/01/2019  02/2021   ROBOTIC ASSISTED BILATERAL SALPINGO OOPHERECTOMY Bilateral 11/24/2017   Procedure: XI ROBOTIC ASSISTED BILATERAL SALPINGO OOPHORECTOMY;  Surgeon: Everitt Amber, MD;  Location: [facility];  Service: Gynecology;  Laterality: Bilateral;    Current Outpatient Medications  Medication Sig Dispense Refill   albuterol (PROAIR HFA) 108 (90 Base) MCG/ACT inhaler Inhale 2 puffs into the lungs every 4 (four) hours as needed for wheezing or shortness of breath.  54 g 3   buPROPion (WELLBUTRIN XL) 150 MG 24 hr tablet TAKE 1 TABLET(150 MG) BY MOUTH DAILY 90 tablet 1   chlorthalidone (HYGROTON) 25 MG tablet Take 1 tablet (25 mg total) by mouth daily. 30 tablet 0   desloratadine (CLARINEX) 5 MG tablet Take 5 mg by mouth daily as needed (for allergies.).      esomeprazole (NEXIUM) 20 MG capsule Take 20 mg by mouth daily at 12 noon.     ezetimibe (ZETIA) 10 MG tablet Take 1 tablet (10 mg total) by mouth daily. 90 tablet 3   losartan (COZAAR) 100 MG tablet TAKE 1 TABLET DAILY 90 tablet 1   metoprolol tartrate (LOPRESSOR) 50 MG tablet Take 1 tablet (50 mg total) by mouth daily. 90 tablet 1   naproxen (NAPROSYN) 500 MG  tablet TAKE 1 TABLET DAILY AS NEEDED FOR MODERATE PAIN 90 tablet 0   psyllium (REGULOID) 0.52 g capsule Take 0.52 g by mouth daily.     No current facility-administered medications for this visit.    Allergies as of 08/02/2021 - Review Complete 08/02/2021  Allergen Reaction Noted   Amlodipine Swelling and Other (See Comments) 11/10/2017   Statins  12/22/2008   Sulfonamide derivatives Rash 05/03/2007    Family History  Problem Relation Age of Onset   Lung cancer Father 29       hx smoking   Diabetes Mother        late onset   Hyperlipidemia Mother    Cancer Mother 80       metholosmia   Cervical cancer Sister 7   Cancer Maternal Aunt 39       colon/rectal   Cancer Paternal Uncle        lung- hx smoking , died 55's   BRCA 1/2 Daughter        had prophlactic bilat mastectomy   Esophageal cancer Brother 79   Breast cancer Paternal Aunt 36   Breast cancer Paternal Aunt 70   Lung cancer Paternal Uncle    Pancreatic cancer Paternal Uncle 31   Cancer Cousin        type unk, dx <50   Coronary artery disease Neg Hx    Hypertension Neg Hx    Stroke Neg Hx    Sudden death Neg Hx    Heart attack Neg Hx    Colon polyps Neg Hx    Rectal cancer Neg Hx    Stomach cancer Neg Hx     Social History   Socioeconomic History   Marital status: Married    Spouse name: Not on file   Number of children: 1   Years of education: Not on file   Highest education level: Not on file  Occupational History   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    Employer: [employer]  Tobacco Use   Smoking status: Former    Packs/day: 0.30    Years: 3.00    Pack years: 0.90    Types: Cigarettes   Smokeless tobacco: Never   Tobacco comments:    quit 04-2013 ([facility])  Vaping Use   Vaping Use: Never used  Substance and Sexual Activity   Alcohol use: Yes    Alcohol/week: 3.0 standard drinks    Types: 3 Standard drinks or equivalent per week    Comment: a week    Drug use: No   Sexual activity:  Yes    Birth control/protection: Post-menopausal  Other Topics Concern   Not on file  Social  History Narrative   remarried 10-11, 1 daughter    Social Determinants of Radio broadcast assistant Strain: Not on file  Food Insecurity: Not on file  Transportation Needs: Not on file  Physical Activity: Not on file  Stress: Not on file  Social Connections: Not on file  Intimate Partner Violence: Not on file     Physical Exam: BP 90/60   Pulse 76   Ht '5\' 8"'  (1.727 m)   Wt 186 lb (84.4 kg)   BMI 28.28 kg/m  Constitutional: generally well-appearing Psychiatric: alert and oriented x3 Abdomen: soft, nontender, nondistended, no obvious ascites, no peritoneal signs, normal bowel sounds No peripheral edema noted in lower extremities Rectal exam deferred for upcoming colonoscopy  Assessment and plan: 65 y.o. female with history of anal cancer, history of precancerous colon polyps, 2 years of anal discomfort, minor bleeding, itching  I think it is certainly possible that she has internal hemorrhoids or perhaps an anal fissure.  Given her personal history of polyps and also personal history of anal cancer I think it is important that we proceed with colonoscopy for best evaluation to make sure nothing else is going on.  Rectal exam was deferred today for upcoming colonoscopy.  I see no reason for any further blood tests or imaging studies prior to then.  However she will start taking over-the-counter Citrucel powder fiber supplement on a daily basis.  Please see the "Patient Instructions" section for addition details about the plan.  Owens Loffler, MD [facility] 08/02/2021, 3:34 PM   Total time on date of encounter was 35 minutes (this included time spent preparing to see the patient reviewing records; obtaining and/or reviewing separately obtained history; performing a medically appropriate exam and/or evaluation; counseling and educating the patient and family if present;  ordering medications, tests or procedures if applicable; and documenting clinical information in the health record).

## 2021-08-02 NOTE — Patient Instructions (Signed)
If you are age 64 or younger, your body mass index should be between 19-25. Your Body mass index is 28.28 kg/m. If this is out of the aformentioned range listed, please consider follow up with your Primary Care Provider.  __________________________________________________________  The [facility] providers would like to encourage you to use [facility] to communicate with providers for non-urgent requests or questions.  Due to long hold times on the telephone, sending your provider a message by [facility] may be a faster and more efficient way to get a response.  Please allow 48 business hours for a response.  Please remember that this is for non-urgent requests.   You have been scheduled for a colonoscopy. Please follow written instructions given to you at your visit today.  Please pick up your prep supplies at the pharmacy within the next 1-3 days. If you use inhalers (even only as needed), please bring them with you on the day of your procedure.  Due to recent changes in healthcare laws, you may see the results of your imaging and laboratory studies on MyChart before your provider has had a chance to review them.  We understand that in some cases there may be results that are confusing or concerning to you. Not all laboratory results come back in the same time frame and the provider may be waiting for multiple results in order to interpret others.  Please give [place] 48 hours in order for your provider to thoroughly review all the results before contacting the office for clarification of your results.   Thank you for entrusting me with your care and choosing [facility].  Dr Ardis Hughs

## 2021-08-05 ENCOUNTER — Encounter: Payer: Self-pay | Admitting: Gastroenterology

## 2021-08-05 ENCOUNTER — Ambulatory Visit (AMBULATORY_SURGERY_CENTER): Payer: BC Managed Care – PPO | Admitting: Gastroenterology

## 2021-08-05 ENCOUNTER — Other Ambulatory Visit: Payer: Self-pay

## 2021-08-05 VITALS — BP 132/66 | HR 78 | Temp 97.7°F | Resp 14 | Ht 68.0 in | Wt 186.0 lb

## 2021-08-05 DIAGNOSIS — Z86010 Personal history of colonic polyps: Secondary | ICD-10-CM | POA: Diagnosis not present

## 2021-08-05 DIAGNOSIS — C210 Malignant neoplasm of anus, unspecified: Secondary | ICD-10-CM

## 2021-08-05 DIAGNOSIS — D124 Benign neoplasm of descending colon: Secondary | ICD-10-CM

## 2021-08-05 MED ORDER — SODIUM CHLORIDE 0.9 % IV SOLN: 500.0000 mL | Freq: Once | INTRAVENOUS | Status: DC

## 2021-08-05 NOTE — Patient Instructions (Addendum)
Read all of the handouts given to you by your recovery room nurse. ? ?YOU HAD AN ENDOSCOPIC PROCEDURE TODAY AT THE [facility]:   Refer to the procedure report that was given to you for any specific questions about what was found during the examination.  If the procedure report does not answer your questions, please call your gastroenterologist to clarify.  If you requested that your care partner not be given the details of your procedure findings, then the procedure report has been included in a sealed envelope for you to review at your convenience later. ? ?YOU SHOULD EXPECT: Some feelings of bloating in the abdomen. Passage of more gas than usual.  Walking can help get rid of the air that was put into your GI tract during the procedure and reduce the bloating. If you had a lower endoscopy (such as a colonoscopy or flexible sigmoidoscopy) you may notice spotting of blood in your stool or on the toilet paper. If you underwent a bowel prep for your procedure, you may not have a normal bowel movement for a few days. ? ?Please Note:  You might notice some irritation and congestion in your nose or some drainage.  This is from the oxygen used during your procedure.  There is no need for concern and it should clear up in a day or so. ? ?SYMPTOMS TO REPORT IMMEDIATELY: ? ?Following lower endoscopy (colonoscopy or flexible sigmoidoscopy): ? Excessive amounts of blood in the stool ? Significant tenderness or worsening of abdominal pains ? Swelling of the abdomen that is new, acute ? Fever of 100?F or higher ? ?  ?For urgent or emergent issues, a gastroenterologist can be reached at any hour by calling [phone]. ?Do not use MyChart messaging for urgent concerns.  ? ? ?DIET:  We do recommend a small meal at first, but then you may proceed to your regular diet.  Drink plenty of fluids but you should avoid alcoholic beverages for 24 hours. ? ?ACTIVITY:  You should plan to take it easy for the rest of today  and you should NOT DRIVE or use heavy machinery until tomorrow (because of the sedation medicines used during the test).   ? ?FOLLOW UP: ?Our staff will call the number listed on your records 48-72 hours following your procedure to check on you and address any questions or concerns that you may have regarding the information given to you following your procedure. If we do not reach you, we will leave a message.  We will attempt to reach you two times.  During this call, we will ask if you have developed any symptoms of COVID 19. If you develop any symptoms (ie: fever, flu-like symptoms, shortness of breath, cough etc.) before then, please call [phone].  If you test positive for Covid 19 in the 2 weeks post procedure, please call and report this information to us.   ? ?If any biopsies were taken you will be contacted by phone or by letter within the next 1-3 weeks.  Please call us at [phone] if you have not heard about the biopsies in 3 weeks.  ? ? ?SIGNATURES/CONFIDENTIALITY: ?You and/or your care partner have signed paperwork which will be entered into your electronic medical record.  These signatures attest to the fact that that the information above on your After Visit Summary has been reviewed and is understood.  Full responsibility of the confidentiality of this discharge information lies with you and/or your care-partner.  ?

## 2021-08-05 NOTE — Progress Notes (Signed)
Pt's states no medical or surgical changes since previsit or office visit.   DT vitals and RY IV.

## 2021-08-05 NOTE — Progress Notes (Signed)
  The recent H&P (dated last week) was reviewed, the patient was examined and there is no change in the patients condition since that H&P was completed.   Sheila Fuller  08/05/2021, 10:52 AM

## 2021-08-05 NOTE — Op Note (Signed)
[place] Patient Name: Sheila Fuller Procedure Date: 08/05/2021 10:54 AM MRN: [id number] Endoscopist: Milus Banister , MD Age: 65 Referring MD:  Date of Birth: 18-Jan-1956 Gender: Female Account #: [id number] Procedure:                Colonoscopy Indications:              High risk colon cancer surveillance: Personal                            history of colonic polyps; History of adenomatous                            colon polyps. Colonoscopy 2007 8 mm tubulovillous                            adenoma. Colonoscopy 2012 3 polyps were removed one                            was a tubular adenoma and one was a sessile                            serrated adenoma. Colonoscopy December 2017 found 4                            subcentimeter polyps. Diverticulosis was also                            noted. Hemorrhoids were not described. Pathology                            pathology showed these were mixed tubular adenomas                            and hyperplastic polyps. 2. History of anal cancer                            (in situ), 2000, status post chemo and XRT. Medicines:                Monitored Anesthesia Care Procedure:                Pre-Anesthesia Assessment:                           - Prior to the procedure, a History and Physical                            was performed, and patient medications and                            allergies were reviewed. The patient's tolerance of                            previous anesthesia was also reviewed. The risks  and benefits of the procedure and the sedation                            options and risks were discussed with the patient.                            All questions were answered, and informed consent                            was obtained. Prior Anticoagulants: The patient has                            taken no previous anticoagulant or antiplatelet                            agents. ASA  Grade Assessment: II - A patient with                            mild systemic disease. After reviewing the risks                            and benefits, the patient was deemed in                            satisfactory condition to undergo the procedure.                           After obtaining informed consent, the colonoscope                            was passed under direct vision. Throughout the                            procedure, the patient's blood pressure, pulse, and                            oxygen saturations were monitored continuously. The                            Olympus CF-HQ190L ([id number]) Colonoscope was                            introduced through the anus and advanced to the the                            cecum, identified by appendiceal orifice and                            ileocecal valve. The colonoscopy was performed                            without difficulty. The patient tolerated the  procedure well. The quality of the bowel                            preparation was good. The ileocecal valve,                            appendiceal orifice, and rectum were photographed.                            Small rectal vault precluded retroflex view of the                            rectum Scope In: 11:02:35 AM Scope Out: 11:14:17 AM Scope Withdrawal Time: 0 hours 7 minutes 43 seconds  Total Procedure Duration: 0 hours 11 minutes 42 seconds  Findings:                 A 2 mm polyp was found in the descending colon. The                            polyp was sessile. The polyp was removed with a                            cold snare. Resection and retrieval were complete.                           Multiple small and large-mouthed diverticula were                            found in the left colon.                           Edematous mucosa in the very distal rectum that was                            not neoplastic appearing. Suggestion of  a small,                            healed anal fissure posterior midline.                           The exam was otherwise without abnormality on                            direct views. Complications:            No immediate complications. Estimated blood loss:                            None. Estimated Blood Loss:     Estimated blood loss: none. Impression:               - One 2 mm polyp in the descending colon, removed                            with  a cold snare. Resected and retrieved.                           - Diverticulosis in the left colon.                           - Edematous mucosa in the very distal rectum that                            was not neoplastic appearing. Suggestion of a                            small, healed anal fissure posterior midline.                           - The examination was otherwise normal on direct                            and retroflexion views. Recommendation:           - Patient has a contact number available for                            emergencies. The signs and symptoms of potential                            delayed complications were discussed with the                            patient. Return to normal activities tomorrow.                            Written discharge instructions were provided to the                            patient.                           - Resume previous diet.                           - Continue present medications.                           - Await pathology results. Milus Banister, MD 08/05/2021 11:19:17 AM This report has been signed electronically.

## 2021-08-05 NOTE — Progress Notes (Signed)
Called to room to assist during endoscopic procedure.  Patient ID and intended procedure confirmed with present staff. Received instructions for my participation in the procedure from the performing physician.  

## 2021-08-05 NOTE — Progress Notes (Signed)
To pacu, VSS. Report to [email or web address] 

## 2021-08-06 ENCOUNTER — Other Ambulatory Visit: Payer: BC Managed Care – PPO

## 2021-08-06 ENCOUNTER — Other Ambulatory Visit: Payer: Self-pay

## 2021-08-06 MED ORDER — BUPROPION HCL ER (XL) 150 MG PO TB24: ORAL_TABLET | ORAL | 1 refills | Status: DC

## 2021-08-07 ENCOUNTER — Telehealth: Payer: Self-pay

## 2021-08-07 NOTE — Telephone Encounter (Signed)
  Follow up Call-  Call back number 08/05/2021  Post procedure Call Back phone  # [phone]  Permission to leave phone message Yes  Some recent data might be hidden     Patient questions:  Do you have a fever, pain , or abdominal swelling? No. Pain Score  0 *  Have you tolerated food without any problems? Yes.    Have you been able to return to your normal activities? Yes.    Do you have any questions about your discharge instructions: Diet   No. Medications  No. Follow up visit  No.  Do you have questions or concerns about your Care? No.  Actions: * If pain score is 4 or above: No action needed, pain <4.  Have you developed a fever since your procedure? no  2.   Have you had an respiratory symptoms (SOB or cough) since your procedure? no  3.   Have you tested positive for COVID 19 since your procedure no  4.   Have you had any family members/close contacts diagnosed with the COVID 19 since your procedure?  no   If yes to any of these questions please route to Joylene John, RN and Joella Prince, RN

## 2021-08-08 ENCOUNTER — Encounter: Payer: Self-pay | Admitting: Gastroenterology

## 2021-08-12 ENCOUNTER — Other Ambulatory Visit: Payer: Self-pay

## 2021-08-12 ENCOUNTER — Other Ambulatory Visit (INDEPENDENT_AMBULATORY_CARE_PROVIDER_SITE_OTHER): Payer: BC Managed Care – PPO

## 2021-08-12 DIAGNOSIS — I10 Essential (primary) hypertension: Secondary | ICD-10-CM | POA: Diagnosis not present

## 2021-08-12 DIAGNOSIS — Z8349 Family history of other endocrine, nutritional and metabolic diseases: Secondary | ICD-10-CM | POA: Diagnosis not present

## 2021-08-12 LAB — BASIC METABOLIC PANEL
BUN: 14 mg/dL (ref 6–23)
CO2: 30 mEq/L (ref 19–32)
Calcium: 9.3 mg/dL (ref 8.4–10.5)
Chloride: 99 mEq/L (ref 96–112)
Creatinine, Ser: 0.96 mg/dL (ref 0.40–1.20)
GFR: 62.49 mL/min (ref >60.00)
Glucose, Bld: 104 mg/dL — ABNORMAL HIGH (ref 70–99)
Potassium: 3.9 mEq/L (ref 3.5–5.1)
Sodium: 138 mEq/L (ref 135–145)

## 2021-08-12 LAB — T4, FREE: Free T4: 0.69 ng/dL (ref 0.60–1.60)

## 2021-08-12 LAB — TSH: TSH: 3.27 u[IU]/mL (ref 0.35–5.50)

## 2021-08-12 LAB — T3, FREE: T3, Free: 3.4 pg/mL (ref 2.3–4.2)

## 2021-08-12 MED ORDER — CHLORTHALIDONE 25 MG PO TABS: 25.0000 mg | ORAL_TABLET | Freq: Every day | ORAL | 1 refills | Status: DC

## 2021-08-12 NOTE — Addendum Note (Signed)
Addended byDamita Dunnings D on: 08/12/2021 01:36 PM   Modules accepted: Orders

## 2021-08-13 DIAGNOSIS — Z1501 Genetic susceptibility to malignant neoplasm of breast: Secondary | ICD-10-CM | POA: Diagnosis not present

## 2021-08-13 DIAGNOSIS — Z48815 Encounter for surgical aftercare following surgery on the digestive system: Secondary | ICD-10-CM | POA: Diagnosis not present

## 2021-08-13 DIAGNOSIS — Z9013 Acquired absence of bilateral breasts and nipples: Secondary | ICD-10-CM | POA: Diagnosis not present

## 2021-09-09 ENCOUNTER — Other Ambulatory Visit: Payer: Self-pay | Admitting: Internal Medicine

## 2021-09-16 ENCOUNTER — Other Ambulatory Visit: Payer: Self-pay | Admitting: Internal Medicine

## 2021-10-07 DIAGNOSIS — M5137 Other intervertebral disc degeneration, lumbosacral region: Secondary | ICD-10-CM | POA: Diagnosis not present

## 2021-10-07 DIAGNOSIS — M9903 Segmental and somatic dysfunction of lumbar region: Secondary | ICD-10-CM | POA: Diagnosis not present

## 2021-10-07 DIAGNOSIS — M5136 Other intervertebral disc degeneration, lumbar region: Secondary | ICD-10-CM | POA: Diagnosis not present

## 2021-10-07 DIAGNOSIS — M9905 Segmental and somatic dysfunction of pelvic region: Secondary | ICD-10-CM | POA: Diagnosis not present

## 2021-10-09 DIAGNOSIS — M6080 Other myositis, unspecified site: Secondary | ICD-10-CM | POA: Diagnosis not present

## 2021-10-09 DIAGNOSIS — M62830 Muscle spasm of back: Secondary | ICD-10-CM | POA: Diagnosis not present

## 2021-10-09 DIAGNOSIS — M546 Pain in thoracic spine: Secondary | ICD-10-CM | POA: Diagnosis not present

## 2021-10-09 DIAGNOSIS — M9902 Segmental and somatic dysfunction of thoracic region: Secondary | ICD-10-CM | POA: Diagnosis not present

## 2021-10-10 DIAGNOSIS — M546 Pain in thoracic spine: Secondary | ICD-10-CM | POA: Diagnosis not present

## 2021-10-10 DIAGNOSIS — M6080 Other myositis, unspecified site: Secondary | ICD-10-CM | POA: Diagnosis not present

## 2021-10-10 DIAGNOSIS — M62830 Muscle spasm of back: Secondary | ICD-10-CM | POA: Diagnosis not present

## 2021-10-10 DIAGNOSIS — M9902 Segmental and somatic dysfunction of thoracic region: Secondary | ICD-10-CM | POA: Diagnosis not present

## 2021-10-17 DIAGNOSIS — M62830 Muscle spasm of back: Secondary | ICD-10-CM | POA: Diagnosis not present

## 2021-10-17 DIAGNOSIS — M6080 Other myositis, unspecified site: Secondary | ICD-10-CM | POA: Diagnosis not present

## 2021-10-17 DIAGNOSIS — M9902 Segmental and somatic dysfunction of thoracic region: Secondary | ICD-10-CM | POA: Diagnosis not present

## 2021-10-17 DIAGNOSIS — M546 Pain in thoracic spine: Secondary | ICD-10-CM | POA: Diagnosis not present

## 2021-10-20 ENCOUNTER — Other Ambulatory Visit: Payer: Self-pay | Admitting: Internal Medicine

## 2021-11-15 ENCOUNTER — Ambulatory Visit: Payer: BC Managed Care – PPO | Admitting: Internal Medicine

## 2021-11-15 ENCOUNTER — Encounter: Payer: Self-pay | Admitting: Internal Medicine

## 2021-11-22 ENCOUNTER — Encounter: Payer: Self-pay | Admitting: Internal Medicine

## 2021-11-22 ENCOUNTER — Ambulatory Visit: Payer: BC Managed Care – PPO | Admitting: Internal Medicine

## 2021-11-22 VITALS — BP 126/82 | HR 55 | Temp 98.0°F | Resp 16 | Ht 68.0 in | Wt 188.5 lb

## 2021-11-22 DIAGNOSIS — R739 Hyperglycemia, unspecified: Secondary | ICD-10-CM | POA: Diagnosis not present

## 2021-11-22 DIAGNOSIS — I10 Essential (primary) hypertension: Secondary | ICD-10-CM | POA: Diagnosis not present

## 2021-11-22 DIAGNOSIS — M1991 Primary osteoarthritis, unspecified site: Secondary | ICD-10-CM

## 2021-11-22 LAB — BASIC METABOLIC PANEL
BUN: 19 mg/dL (ref 6–23)
CO2: 31 mEq/L (ref 19–32)
Calcium: 9.1 mg/dL (ref 8.4–10.5)
Chloride: 96 mEq/L (ref 96–112)
Creatinine, Ser: 0.97 mg/dL (ref 0.40–1.20)
GFR: 61.59 mL/min (ref >60.00)
Glucose, Bld: 75 mg/dL (ref 70–99)
Potassium: 4.3 mEq/L (ref 3.5–5.1)
Sodium: 133 mEq/L — ABNORMAL LOW (ref 135–145)

## 2021-11-22 NOTE — Patient Instructions (Signed)
Check the  blood pressure regularly BP GOAL is between 110/65 and  135/85. If it is consistently higher or lower, let me know   GO TO THE LAB : Get the blood work     [place], [place] back for  a CPX by [phone]

## 2021-11-22 NOTE — Progress Notes (Signed)
Subjective:    Patient ID: Sheila Fuller, female    DOB: December 04, 1955, 66 y.o.   MRN: [id number]  DOS:  11/22/2021 Type of visit - description: ROV  Since the last office visit is doing well.  Has no concerns. Takes naproxen at least daily. No stomach symptoms other than occasional diarrhea. BPs were low for a while but they are back to normal. Good compliance with CPAP. Chart is reviewed, had a colonoscopy.    Review of Systems See above   Past Medical History:  Diagnosis Date   Allergic rhinitis    Allergy    Anxiety    Arthritis    Asthma    seasonally   BRCA1 positive    Cancer ([place])    skin   Carcinoma in situ of perianal skin    +XRT & chemo 2000; no further f/u w/ specialist.    Cataract    will remove left end of 10-2016   Class 1 obesity    Depression    Family history of BRCA gene mutation    daughter BRCA1 +   Family history of breast cancer    Family history of pancreatic cancer    GERD (gastroesophageal reflux disease)    takes zantac maybe 2/week   Hyperlipidemia    Hypertension    Lactose intolerance 10/04/2013   Microhematuria 01/2006   saw Dr.Peterson, renal u/s: r pyelocaliectasis but CT was neg, cysto neg   Shortness of breath    only with asthma flare up   Sleep apnea    rarely uses cpap    Past Surgical History:  Procedure Laterality Date   ANTERIOR CERVICAL DECOMP/DISCECTOMY FUSION N/A 08/23/2014   Procedure: C5-6 Anterior Cervical Discectomy and Fusion, Allograft, Plate;  Surgeon: Marybelle Killings, MD;  Location: [facility];  Service: Orthopedics;  Laterality: N/A;   BREAST SURGERY  09/2018   BRCA-1 gene positive, +reconstruction   cervical cauterization     CESAREAN SECTION     GANGLION CYST EXCISION     MASTECTOMY Bilateral ~ 1/22020   NASAL SEPTUM SURGERY     POLYPECTOMY     Revision  breast reconstruction surgery 03/01/2019  02/2021   ROBOTIC ASSISTED BILATERAL SALPINGO OOPHERECTOMY Bilateral 11/24/2017   Procedure: XI ROBOTIC ASSISTED  BILATERAL SALPINGO OOPHORECTOMY;  Surgeon: Everitt Amber, MD;  Location: [facility];  Service: Gynecology;  Laterality: Bilateral;    Current Outpatient Medications  Medication Instructions   albuterol (PROAIR HFA) 108 (90 Base) MCG/ACT inhaler 2 puffs, Inhalation, Every 4 hours PRN   buPROPion (WELLBUTRIN XL) 150 MG 24 hr tablet TAKE 1 TABLET(150 MG) BY MOUTH DAILY   chlorthalidone (HYGROTON) 25 mg, Oral, Daily   desloratadine (CLARINEX) 5 mg, Oral, Daily PRN   esomeprazole (NEXIUM) 20 mg, Oral, Daily   ezetimibe (ZETIA) 10 mg, Oral, Daily   losartan (COZAAR) 100 MG tablet TAKE 1 TABLET DAILY   metoprolol tartrate (LOPRESSOR) 50 MG tablet TAKE 1 TABLET DAILY   naproxen (NAPROSYN) 500 MG tablet TAKE 1 TABLET DAILY AS NEEDED FOR MODERATE PAIN   psyllium (REGULOID) 0.52 g, Oral, Daily       Objective:   Physical Exam BP 126/82 (BP Location: Left Arm, Patient Position: Sitting, Cuff Size: Small)    Pulse (!) 55    Temp 98 F (36.7 C) (Oral)    Resp 16    Ht '5\' 8"'  (1.727 m)    Wt 188 lb 8 oz (85.5 kg)    SpO2 97%  BMI 28.66 kg/m  General:   Well developed, NAD, BMI noted. HEENT:  Normocephalic . Face symmetric, atraumatic Lungs:  CTA B Normal respiratory effort, no intercostal retractions, no accessory muscle use. Heart: RRR,  no murmur.  Lower extremities: no pretibial edema bilaterally  Skin: Not pale. Not jaundice Neurologic:  alert & oriented X3.  Speech normal, gait appropriate for age and unassisted Psych--  Cognition and judgment appear intact.  Cooperative with normal attention span and concentration.  Behavior appropriate. No anxious or depressed appearing.      Assessment     Assessment Hyperglycemia HTN Hyperlipidemia (statin intolerant) Anxiety, depression GERD Seasonal asthma Allergies Obesity : BMI~ 28 plus HTN, hyperlipidemia, OSA OSA: on Cpap consistently as off 04-2021 MSK: DJD, occasionally uses pain medication H/o Microhematuria 2007, Dr. Terance Hart,  renal US showed right pyelocaliectasis, CT was negative, cystoscopy negative + BRCA: personal h/o (dx 2018)  and FH BRCA (daughter, sister).  See office visit 10/14/2017 Bilateral mastectomy 09/2018 H/o Carcinoma in situ, perianal skin, XRT, chemotherapy 2000. Not further follow-ups with specialist HOH: has hearing aids B   PLAN:  Hyperglycemia: Last A1c was 6.1, rec a low carbohydrate diet and stay active HTN: At some point her BP was a slightly low but is back to normal.  Continue chlorthalidone, losartan, metoprolol.  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  GI precautions discussed.  Checking renal function. OSA: Good CPAP compliance Preventive care: Colonoscopy 08/05/2021: 1 polyp.  Next per GI. Vaccinations: Up-to-date on COVID and flu vaccines. RTC 04-2022 CPX   This visit occurred during the SARS-CoV-2 public health emergency.  Safety protocols were in place, including screening questions prior to the visit, additional usage of staff PPE, and extensive cleaning of exam room while observing appropriate contact time as indicated for disinfecting solutions.

## 2021-11-22 NOTE — Assessment & Plan Note (Signed)
Hyperglycemia: Last A1c was 6.1, rec a low carbohydrate diet and stay active HTN: At some point her BP was a slightly low but is back to normal.  Continue chlorthalidone, losartan, metoprolol.  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  GI precautions discussed.  Checking renal function. OSA: Good CPAP compliance Preventive care: Colonoscopy 08/05/2021: 1 polyp.  Next per GI. Vaccinations: Up-to-date on COVID and flu vaccines. RTC 04-2022 CPX

## 2021-11-29 DIAGNOSIS — H43813 Vitreous degeneration, bilateral: Secondary | ICD-10-CM | POA: Diagnosis not present

## 2021-12-16 ENCOUNTER — Other Ambulatory Visit: Payer: Self-pay | Admitting: Internal Medicine

## 2021-12-25 ENCOUNTER — Other Ambulatory Visit: Payer: Self-pay | Admitting: Internal Medicine

## 2022-01-15 ENCOUNTER — Other Ambulatory Visit: Payer: Self-pay | Admitting: Internal Medicine

## 2022-01-21 ENCOUNTER — Other Ambulatory Visit: Payer: Self-pay | Admitting: Internal Medicine

## 2022-02-14 ENCOUNTER — Other Ambulatory Visit: Payer: Self-pay | Admitting: Internal Medicine

## 2022-02-18 DIAGNOSIS — M9903 Segmental and somatic dysfunction of lumbar region: Secondary | ICD-10-CM | POA: Diagnosis not present

## 2022-02-18 DIAGNOSIS — M9902 Segmental and somatic dysfunction of thoracic region: Secondary | ICD-10-CM | POA: Diagnosis not present

## 2022-03-10 ENCOUNTER — Other Ambulatory Visit: Payer: Self-pay | Admitting: Internal Medicine

## 2022-05-20 DIAGNOSIS — M545 Low back pain, unspecified: Secondary | ICD-10-CM | POA: Diagnosis not present

## 2022-05-20 DIAGNOSIS — M9903 Segmental and somatic dysfunction of lumbar region: Secondary | ICD-10-CM | POA: Diagnosis not present

## 2022-05-20 DIAGNOSIS — M9902 Segmental and somatic dysfunction of thoracic region: Secondary | ICD-10-CM | POA: Diagnosis not present

## 2022-05-20 DIAGNOSIS — M546 Pain in thoracic spine: Secondary | ICD-10-CM | POA: Diagnosis not present

## 2022-05-27 ENCOUNTER — Encounter: Payer: Self-pay | Admitting: Internal Medicine

## 2022-05-28 DIAGNOSIS — M9903 Segmental and somatic dysfunction of lumbar region: Secondary | ICD-10-CM | POA: Diagnosis not present

## 2022-05-28 DIAGNOSIS — M9901 Segmental and somatic dysfunction of cervical region: Secondary | ICD-10-CM | POA: Diagnosis not present

## 2022-05-28 DIAGNOSIS — M545 Low back pain, unspecified: Secondary | ICD-10-CM | POA: Diagnosis not present

## 2022-05-28 DIAGNOSIS — M9902 Segmental and somatic dysfunction of thoracic region: Secondary | ICD-10-CM | POA: Diagnosis not present

## 2022-06-03 ENCOUNTER — Ambulatory Visit (INDEPENDENT_AMBULATORY_CARE_PROVIDER_SITE_OTHER): Payer: BC Managed Care – PPO | Admitting: Internal Medicine

## 2022-06-03 ENCOUNTER — Encounter: Payer: Self-pay | Admitting: Internal Medicine

## 2022-06-03 VITALS — BP 126/80 | HR 53 | Temp 98.1°F | Resp 16 | Ht 68.0 in | Wt 179.0 lb

## 2022-06-03 DIAGNOSIS — I10 Essential (primary) hypertension: Secondary | ICD-10-CM

## 2022-06-03 DIAGNOSIS — M546 Pain in thoracic spine: Secondary | ICD-10-CM | POA: Diagnosis not present

## 2022-06-03 DIAGNOSIS — Z0000 Encounter for general adult medical examination without abnormal findings: Secondary | ICD-10-CM

## 2022-06-03 DIAGNOSIS — M9902 Segmental and somatic dysfunction of thoracic region: Secondary | ICD-10-CM | POA: Diagnosis not present

## 2022-06-03 DIAGNOSIS — Z23 Encounter for immunization: Secondary | ICD-10-CM

## 2022-06-03 DIAGNOSIS — E785 Hyperlipidemia, unspecified: Secondary | ICD-10-CM | POA: Diagnosis not present

## 2022-06-03 DIAGNOSIS — R739 Hyperglycemia, unspecified: Secondary | ICD-10-CM

## 2022-06-03 DIAGNOSIS — M9903 Segmental and somatic dysfunction of lumbar region: Secondary | ICD-10-CM | POA: Diagnosis not present

## 2022-06-03 DIAGNOSIS — M545 Low back pain, unspecified: Secondary | ICD-10-CM | POA: Diagnosis not present

## 2022-06-03 LAB — CBC WITH DIFFERENTIAL/PLATELET
Basophils Absolute: 0 10*3/uL (ref 0.0–0.1)
Basophils Relative: 0.6 % (ref 0.0–3.0)
Eosinophils Absolute: 0.1 10*3/uL (ref 0.0–0.7)
Eosinophils Relative: 1.5 % (ref 0.0–5.0)
HCT: 42.5 % (ref 36.0–46.0)
Hemoglobin: 14.4 g/dL (ref 12.0–15.0)
Lymphocytes Relative: 27.7 % (ref 12.0–46.0)
Lymphs Abs: 1.9 10*3/uL (ref 0.7–4.0)
MCHC: 34 g/dL (ref 30.0–36.0)
MCV: 90.8 fl (ref 78.0–100.0)
Monocytes Absolute: 0.4 10*3/uL (ref 0.1–1.0)
Monocytes Relative: 5.9 % (ref 3.0–12.0)
Neutro Abs: 4.3 10*3/uL (ref 1.4–7.7)
Neutrophils Relative %: 64.3 % (ref 43.0–77.0)
Platelets: 233 10*3/uL (ref 150.0–400.0)
RBC: 4.67 Mil/uL (ref 3.87–5.11)
RDW: 13.3 % (ref 11.5–15.5)
WBC: 6.7 10*3/uL (ref 4.0–10.5)

## 2022-06-03 LAB — COMPREHENSIVE METABOLIC PANEL WITH GFR
ALT: 17 U/L (ref 0–35)
AST: 17 U/L (ref 0–37)
Albumin: 4.4 g/dL (ref 3.5–5.2)
Alkaline Phosphatase: 64 U/L (ref 39–117)
BUN: 12 mg/dL (ref 6–23)
CO2: 30 meq/L (ref 19–32)
Calcium: 9.1 mg/dL (ref 8.4–10.5)
Chloride: 98 meq/L (ref 96–112)
Creatinine, Ser: 0.92 mg/dL (ref 0.40–1.20)
GFR: 65.39 mL/min
Glucose, Bld: 95 mg/dL (ref 70–99)
Potassium: 3.9 meq/L (ref 3.5–5.1)
Sodium: 141 meq/L (ref 135–145)
Total Bilirubin: 0.5 mg/dL (ref 0.2–1.2)
Total Protein: 6.9 g/dL (ref 6.0–8.3)

## 2022-06-03 LAB — LIPID PANEL
Cholesterol: 231 mg/dL — ABNORMAL HIGH (ref 0–200)
HDL: 58 mg/dL (ref >39.00)
NonHDL: 173.16
Total CHOL/HDL Ratio: 4
Triglycerides: 211 mg/dL — ABNORMAL HIGH (ref 0.0–149.0)
VLDL: 42.2 mg/dL — ABNORMAL HIGH (ref 0.0–40.0)

## 2022-06-03 LAB — HEMOGLOBIN A1C: Hgb A1c MFr Bld: 6.2 % (ref 4.6–6.5)

## 2022-06-03 LAB — LDL CHOLESTEROL, DIRECT: Direct LDL: 146 mg/dL

## 2022-06-03 NOTE — Assessment & Plan Note (Signed)
Here for CPX Hyperglycemia: Check A1c, encouraged healthy lifestyle. HTN: BP is very good, labs, recommend ambulatory BPs Hyperlipidemia: On Zetia, statin intolerant, check FLP. Anxiety depression: Controlled on Wellbutrin. OSA: Good CPAP compliance, would like to explore INSPIRE, advised to see pulmonary. MSK: Self decrease naproxen to one QOD (d/t diarrhea), still getting good results.   RTC 1 year.  Sooner if needed

## 2022-06-03 NOTE — Patient Instructions (Addendum)
Check the  blood pressure regularly BP GOAL is between 110/65 and  135/85. If it is consistently higher or lower, let me know     GO TO THE LAB : Get the blood work     [place], [place] back for a physical exam in 1 year.  Sooner if needed

## 2022-06-03 NOTE — Progress Notes (Signed)
Subjective:    Patient ID: Sheila Fuller, female    DOB: December 19, 1955, 66 y.o.   MRN: [id number]  DOS:  06/03/2022 Type of visit - description: CPX  Here for CPX. Doing well. She had some diarrhea, no associated blood in the stools, she self decrease naproxen dose and feels better.    Review of Systems  Other than above, a 14 point review of systems is negative     Past Medical History:  Diagnosis Date   Allergic rhinitis    Allergy    Anxiety    Arthritis    Asthma    seasonally   BRCA1 positive    Cancer ([place])    skin   Carcinoma in situ of perianal skin    +XRT & chemo 2000; no further f/u w/ specialist.    Cataract    will remove left end of 10-2016   Class 1 obesity    Depression    Family history of BRCA gene mutation    daughter BRCA1 +   Family history of breast cancer    Family history of pancreatic cancer    GERD (gastroesophageal reflux disease)    takes zantac maybe 2/week   Hyperlipidemia    Hypertension    Lactose intolerance 10/04/2013   Microhematuria 01/2006   saw Dr.Peterson, renal u/s: r pyelocaliectasis but CT was neg, cysto neg   Shortness of breath    only with asthma flare up   Sleep apnea    rarely uses cpap    Past Surgical History:  Procedure Laterality Date   ANTERIOR CERVICAL DECOMP/DISCECTOMY FUSION N/A 08/23/2014   Procedure: C5-6 Anterior Cervical Discectomy and Fusion, Allograft, Plate;  Surgeon: Marybelle Killings, MD;  Location: [facility];  Service: Orthopedics;  Laterality: N/A;   BREAST SURGERY  09/2018   BRCA-1 gene positive, +reconstruction   cervical cauterization     CESAREAN SECTION     GANGLION CYST EXCISION     MASTECTOMY Bilateral ~ 1/22020   NASAL SEPTUM SURGERY     POLYPECTOMY     Revision  breast reconstruction surgery 03/01/2019  02/2021   ROBOTIC ASSISTED BILATERAL SALPINGO OOPHERECTOMY Bilateral 11/24/2017   Procedure: XI ROBOTIC ASSISTED BILATERAL SALPINGO OOPHORECTOMY;  Surgeon: Everitt Amber, MD;  Location[facility];   Service: Gynecology;  Laterality: Bilateral;   Social History   Socioeconomic History   Marital status: Married    Spouse name: Not on file   Number of children: 1   Years of education: Not on file   Highest education level: Not on file  Occupational History   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    Employer: [employer]  Tobacco Use   Smoking status: Former    Packs/day: 0.30    Years: 3.00    Total pack years: 0.90    Types: Cigarettes   Smokeless tobacco: Never   Tobacco comments:    Smokes episodically on-off x years, started age 43, x ~ 84 years was smoke free, never heavy use, currently 1/3 ppd as off 05/2022    20,   Vaping Use   Vaping Use: Never used  Substance and Sexual Activity   Alcohol use: Yes    Alcohol/week: 3.0 standard drinks of alcohol    Types: 3 Standard drinks or equivalent per week    Comment: a week    Drug use: No   Sexual activity: Yes    Birth control/protection: Post-menopausal  Other Topics Concern   Not on file  Social History Narrative   remarried 10-11, 1 daughter    Social Determinants of Radio broadcast assistant Strain: Not on file  Food Insecurity: Not on file  Transportation Needs: Not on file  Physical Activity: Not on file  Stress: Not on file  Social Connections: Not on file  Intimate Partner Violence: Not on file     Current Outpatient Medications  Medication Instructions   albuterol (PROAIR HFA) 108 (90 Base) MCG/ACT inhaler 2 puffs, Inhalation, Every 4 hours PRN   buPROPion (WELLBUTRIN XL) 150 MG 24 hr tablet TAKE 1 TABLET DAILY   chlorthalidone (HYGROTON) 25 MG tablet TAKE 1 TABLET DAILY   desloratadine (CLARINEX) 5 mg, Oral, Daily PRN   esomeprazole (NEXIUM) 20 mg, Oral, Daily   ezetimibe (ZETIA) 10 MG tablet TAKE 1 TABLET DAILY   losartan (COZAAR) 100 MG tablet TAKE 1 TABLET DAILY   metoprolol tartrate (LOPRESSOR) 50 mg, Oral, Daily   naproxen (NAPROSYN) 500 MG tablet TAKE 1 TABLET DAILY AS NEEDED FOR MODERATE  PAIN   psyllium (REGULOID) 0.52 g, Oral, Daily   TYRVAYA 0.03 MG/ACT SOLN 2 sprays, Each Nare, Daily       Objective:   Physical Exam BP 126/80   Pulse (!) 53   Temp 98.1 F (36.7 C) (Oral)   Resp 16   Ht _0  (1.727 m)   Wt 179 lb (81.2 kg)   SpO2 98%   BMI 27.22 kg/m  General: Well developed, NAD, BMI noted Neck: No  thyromegaly  HEENT:  Normocephalic . Face symmetric, atraumatic Lungs:  CTA B Normal respiratory effort, no intercostal retractions, no accessory muscle use. Heart: RRR,  no murmur.  Abdomen:  Not distended, soft, non-tender. No rebound or rigidity.   Lower extremities: no pretibial edema bilaterally  Skin: Exposed areas without rash. Not pale. Not jaundice Neurologic:  alert & oriented X3.  Speech normal, gait appropriate for age and unassisted Strength symmetric and appropriate for age.  Psych: Cognition and judgment appear intact.  Cooperative with normal attention span and concentration.  Behavior appropriate. No anxious or depressed appearing.     Assessment     Assessment Hyperglycemia HTN Hyperlipidemia (statin intolerant) Anxiety, depression GERD Seasonal asthma-Allergies Obesity : BMI~ 28 plus HTN, hyperlipidemia, OSA OSA: on Cpap consistently as off 04-2021 MSK: DJD, occasionally uses pain medication H/o Microhematuria 2007, Dr. Terance Hart, renal US showed right pyelocaliectasis, CT was negative, cystoscopy negative + BRCA: personal h/o (dx 2018)  and FH BRCA (daughter, sister).  See office visit 10/14/2017 Bilateral mastectomy 09/2018 H/o Carcinoma in situ, perianal skin, XRT, chemotherapy 2000. Not further follow-ups with specialist HOH: has hearing aids B   PLAN:  Here for CPX Hyperglycemia: Check A1c, encouraged healthy lifestyle. HTN: BP is very good, labs, recommend ambulatory BPs Hyperlipidemia: On Zetia, statin intolerant, check FLP. Anxiety depression: Controlled on Wellbutrin. OSA: Good CPAP compliance, would like to  explore INSPIRE, advised to see pulmonary. MSK: Self decrease naproxen to one QOD (d/t diarrhea), still getting good results.   RTC 1 year.  Sooner if needed

## 2022-06-03 NOTE — Assessment & Plan Note (Signed)
-  Td 10/2018 - Pneumonia shot 2014; prevnar 2016; PNM 20: Today -  Shingrix completed 2020 -Covid vax: last shot 07-2021, booster is optional - rec  flu shot q year  -CCS: Cscopes 2003 , 2007 (Tubullovillous polyps) , 2012,  09-2016 and 07/2021 , next per GI - female care per gyn:  *s/p oophorectomy 2018   *PAP 2021, was rec no further PAPs *Breast ca screening: s/p B  Mastectomy, d/t BRCA , no further MMG - Dexa 12-2006 ,  11-11,-2020: Normal; h/o early menopause.  Next DEXA ~ 2024 - former smoker: Never heavy smoker, not interested on CT chest for lung cancer screening.  Reportedly (-)  genetic testing for lung cancer -Labs: CMP, FLP, CBC, A1c -Diet and exercise d/w pt  -Healthcare POA: On file

## 2022-06-13 ENCOUNTER — Other Ambulatory Visit: Payer: Self-pay | Admitting: Internal Medicine

## 2022-06-16 DIAGNOSIS — Z9889 Other specified postprocedural states: Secondary | ICD-10-CM | POA: Diagnosis not present

## 2022-06-16 DIAGNOSIS — Z803 Family history of malignant neoplasm of breast: Secondary | ICD-10-CM | POA: Diagnosis not present

## 2022-06-16 DIAGNOSIS — Z1501 Genetic susceptibility to malignant neoplasm of breast: Secondary | ICD-10-CM | POA: Diagnosis not present

## 2022-06-16 DIAGNOSIS — Z0189 Encounter for other specified special examinations: Secondary | ICD-10-CM | POA: Diagnosis not present

## 2022-06-16 DIAGNOSIS — Z800 Family history of malignant neoplasm of digestive organs: Secondary | ICD-10-CM | POA: Diagnosis not present

## 2022-06-16 DIAGNOSIS — Z1509 Genetic susceptibility to other malignant neoplasm: Secondary | ICD-10-CM | POA: Diagnosis not present

## 2022-06-16 DIAGNOSIS — Z9013 Acquired absence of bilateral breasts and nipples: Secondary | ICD-10-CM | POA: Diagnosis not present

## 2022-06-17 DIAGNOSIS — M9904 Segmental and somatic dysfunction of sacral region: Secondary | ICD-10-CM | POA: Diagnosis not present

## 2022-06-17 DIAGNOSIS — M9903 Segmental and somatic dysfunction of lumbar region: Secondary | ICD-10-CM | POA: Diagnosis not present

## 2022-06-17 DIAGNOSIS — M546 Pain in thoracic spine: Secondary | ICD-10-CM | POA: Diagnosis not present

## 2022-06-17 DIAGNOSIS — M9902 Segmental and somatic dysfunction of thoracic region: Secondary | ICD-10-CM | POA: Diagnosis not present

## 2022-06-17 DIAGNOSIS — M545 Low back pain, unspecified: Secondary | ICD-10-CM | POA: Diagnosis not present

## 2022-06-23 ENCOUNTER — Other Ambulatory Visit: Payer: Self-pay | Admitting: Internal Medicine

## 2022-07-03 DIAGNOSIS — M9903 Segmental and somatic dysfunction of lumbar region: Secondary | ICD-10-CM | POA: Diagnosis not present

## 2022-07-03 DIAGNOSIS — M545 Low back pain, unspecified: Secondary | ICD-10-CM | POA: Diagnosis not present

## 2022-07-03 DIAGNOSIS — M9902 Segmental and somatic dysfunction of thoracic region: Secondary | ICD-10-CM | POA: Diagnosis not present

## 2022-07-03 DIAGNOSIS — M9901 Segmental and somatic dysfunction of cervical region: Secondary | ICD-10-CM | POA: Diagnosis not present

## 2022-07-14 ENCOUNTER — Other Ambulatory Visit: Payer: Self-pay | Admitting: Internal Medicine

## 2022-07-16 ENCOUNTER — Encounter: Payer: Self-pay | Admitting: Pulmonary Disease

## 2022-07-16 ENCOUNTER — Ambulatory Visit: Payer: BC Managed Care – PPO | Admitting: Pulmonary Disease

## 2022-07-16 VITALS — BP 120/70 | HR 50 | Temp 97.9°F | Ht 68.0 in | Wt 176.6 lb

## 2022-07-16 DIAGNOSIS — Z9989 Dependence on other enabling machines and devices: Secondary | ICD-10-CM | POA: Diagnosis not present

## 2022-07-16 DIAGNOSIS — G4733 Obstructive sleep apnea (adult) (pediatric): Secondary | ICD-10-CM

## 2022-07-16 NOTE — Progress Notes (Signed)
Sheila Fuller    [id number]    11/10/1962  Primary Care Physician:Paz, Alda Berthold, MD  Referring Physician: Colon Branch, MD [address] [place],  [place] [address]  Chief complaint:   Patient with a history of obstructive sleep apnea  HPI:  Sleep apnea was diagnosed about 2012 Has been on CPAP  Has been tolerating CPAP well without significant concerns Has been compliant with CPAP use the last 4 to 5 years  Sleeps well, feels rejuvenated waking up in the morning Uses a nasal pillow  Usually goes to bed about 9 PM Takes about 20 minutes to fall asleep About 2 awakenings Final wake up time between 5 and 6 AM  Weight is generally stable  Usually wakes up from sleep feeling like she is at a good nights rest  She has hypertension, asthma, hypercholesterolemia-all well controlled Exercises regularly  No ongoing significant health problems that is not controlled   Outpatient Encounter Medications as of 07/16/2022  Medication Sig   albuterol (PROAIR HFA) 108 (90 Base) MCG/ACT inhaler Inhale 2 puffs into the lungs every 4 (four) hours as needed for wheezing or shortness of breath.   buPROPion (WELLBUTRIN XL) 150 MG 24 hr tablet Take 1 tablet (150 mg total) by mouth daily.   chlorthalidone (HYGROTON) 25 MG tablet TAKE 1 TABLET DAILY   desloratadine (CLARINEX) 5 MG tablet Take 5 mg by mouth daily as needed (for allergies.).    esomeprazole (NEXIUM) 20 MG capsule Take 20 mg by mouth daily at 12 noon.   ezetimibe (ZETIA) 10 MG tablet TAKE 1 TABLET DAILY   losartan (COZAAR) 100 MG tablet Take 1 tablet (100 mg total) by mouth daily.   metoprolol tartrate (LOPRESSOR) 50 MG tablet Take 1 tablet (50 mg total) by mouth daily.   naproxen (NAPROSYN) 500 MG tablet TAKE 1 TABLET DAILY AS NEEDED FOR MODERATE PAIN   psyllium (REGULOID) 0.52 g capsule Take 0.52 g by mouth daily.   TYRVAYA 0.03 MG/ACT SOLN Place 2 sprays into both nostrils daily.   No  facility-administered encounter medications on file as of 07/16/2022.    Allergies as of 07/16/2022 - Review Complete 07/16/2022  Allergen Reaction Noted   Statins Other (See Comments) 12/22/2008   Amlodipine Swelling and Other (See Comments) 11/10/2017   Sulfonamide derivatives Rash 05/03/2007    Past Medical History:  Diagnosis Date   Allergic rhinitis    Allergy    Anxiety    Arthritis    Asthma    seasonally   BRCA1 positive    Cancer ([place])    skin   Carcinoma in situ of perianal skin    +XRT & chemo 2000; no further f/u w/ specialist.    Cataract    will remove left end of 10-2016   Class 1 obesity    Depression    Family history of BRCA gene mutation    daughter BRCA1 +   Family history of breast cancer    Family history of pancreatic cancer    GERD (gastroesophageal reflux disease)    takes zantac maybe 2/week   Hyperlipidemia    Hypertension    Lactose intolerance 10/04/2013   Microhematuria 01/2006   saw Dr.Peterson, renal u/s: r pyelocaliectasis but CT was neg, cysto neg   Shortness of breath    only with asthma flare up   Sleep apnea    rarely uses cpap    Past Surgical  History:  Procedure Laterality Date   ANTERIOR CERVICAL DECOMP/DISCECTOMY FUSION N/A 08/23/2014   Procedure: C5-6 Anterior Cervical Discectomy and Fusion, Allograft, Plate;  Surgeon: Marybelle Killings, MD;  Location: [facility];  Service: Orthopedics;  Laterality: N/A;   BREAST SURGERY  09/2018   BRCA-1 gene positive, +reconstruction   cervical cauterization     CESAREAN SECTION     GANGLION CYST EXCISION     MASTECTOMY Bilateral ~ 1/22020   NASAL SEPTUM SURGERY     POLYPECTOMY     Revision  breast reconstruction surgery 03/01/2019  02/2021   ROBOTIC ASSISTED BILATERAL SALPINGO OOPHERECTOMY Bilateral 11/24/2017   Procedure: XI ROBOTIC ASSISTED BILATERAL SALPINGO OOPHORECTOMY;  Surgeon: Everitt Amber, MD;  Location: [facility];  Service: Gynecology;  Laterality: Bilateral;    Family History  Problem  Relation Age of Onset   Lung cancer Father 54       hx smoking   Diabetes Mother        late onset   Hyperlipidemia Mother    Cancer Mother 69       metholosmia   Cervical cancer Sister 19   Cancer Maternal Aunt 50       colon/rectal   Cancer Paternal Uncle        lung- hx smoking , died 59's   BRCA 1/2 Daughter        had prophlactic bilat mastectomy   Esophageal cancer Brother 24   Breast cancer Paternal Aunt 25   Breast cancer Paternal Aunt 42   Lung cancer Paternal Uncle    Pancreatic cancer Paternal Uncle 48   Cancer Cousin        type unk, dx <50   Coronary artery disease Neg Hx    Hypertension Neg Hx    Stroke Neg Hx    Sudden death Neg Hx    Heart attack Neg Hx    Colon polyps Neg Hx    Rectal cancer Neg Hx    Stomach cancer Neg Hx     Social History   Socioeconomic History   Marital status: Married    Spouse name: Not on file   Number of children: 1   Years of education: Not on file   Highest education level: Not on file  Occupational History   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    Employer: [employer]  Tobacco Use   Smoking status: Former    Packs/day: 0.30    Years: 3.00    Total pack years: 0.90    Types: Cigarettes   Smokeless tobacco: Never   Tobacco comments:    Smokes episodically on-off x years, started age 73, x ~ 14 years was smoke free, never heavy use, currently 1/3 ppd as off 05/2022    20,   Vaping Use   Vaping Use: Never used  Substance and Sexual Activity   Alcohol use: Yes    Alcohol/week: 3.0 standard drinks of alcohol    Types: 3 Standard drinks or equivalent per week    Comment: a week    Drug use: No   Sexual activity: Yes    Birth control/protection: Post-menopausal  Other Topics Concern   Not on file  Social History Narrative   remarried 10-11, 1 daughter    Social Determinants of Health   Financial Resource Strain: Not on file  Food Insecurity: Not on file  Transportation Needs: Not on file  Physical Activity:  Not on file  Stress: Not on file  Social Connections: Not  on file  Intimate Partner Violence: Not on file    Review of Systems  Constitutional:  Negative for fatigue.  Respiratory:  Positive for apnea.   Cardiovascular: Negative.   Gastrointestinal: Negative.   Psychiatric/Behavioral:  Positive for sleep disturbance.     Vitals:   07/16/22 1021  BP: 120/70  Pulse: (!) 50  Temp: 97.9 F (36.6 C)  SpO2: 98%     Physical Exam Constitutional:      Appearance: Normal appearance.  HENT:     Head: Normocephalic.     Nose: No congestion.     Mouth/Throat:     Mouth: Mucous membranes are moist.  Eyes:     Pupils: Pupils are equal, round, and reactive to light.  Cardiovascular:     Rate and Rhythm: Normal rate and regular rhythm.     Pulses: Normal pulses.     Heart sounds: No murmur heard.    No friction rub.  Pulmonary:     Effort: Pulmonary effort is normal. No respiratory distress.     Breath sounds: Normal breath sounds. No stridor. No wheezing or rhonchi.  Musculoskeletal:        General: Normal range of motion.     Cervical back: No rigidity or tenderness.  Skin:    General: Skin is warm.  Neurological:     Mental Status: She is alert.     Cranial Nerves: No cranial nerve deficit.    Data Reviewed: Previous sleep study was reviewed showing moderate obstructive sleep apnea Compliance data not available today  Assessment:  Moderate obstructive sleep apnea -Significant improvement in symptoms -Continues to be compliant  Exercises regularly  Do not have compliance data available today   Plan/Recommendations:  No changes to CPAP  Machine is dated however not having any problems with it  May need an upgraded machine at some point  I will follow-up in a year  Encouraged to call with any significant concerns  Sherrilyn Rist MD [facility] 07/16/2022, 10:27 AM  CC: Colon Branch, MD

## 2022-07-16 NOTE — Patient Instructions (Signed)
Continue using CPAP nightly  I will see you back in a year  Call with significant concerns  Call if you have any issues with your CPAP

## 2022-07-17 DIAGNOSIS — M9901 Segmental and somatic dysfunction of cervical region: Secondary | ICD-10-CM | POA: Diagnosis not present

## 2022-07-17 DIAGNOSIS — M546 Pain in thoracic spine: Secondary | ICD-10-CM | POA: Diagnosis not present

## 2022-07-17 DIAGNOSIS — M9903 Segmental and somatic dysfunction of lumbar region: Secondary | ICD-10-CM | POA: Diagnosis not present

## 2022-07-17 DIAGNOSIS — M9902 Segmental and somatic dysfunction of thoracic region: Secondary | ICD-10-CM | POA: Diagnosis not present

## 2022-07-17 DIAGNOSIS — M545 Low back pain, unspecified: Secondary | ICD-10-CM | POA: Diagnosis not present

## 2022-07-21 ENCOUNTER — Other Ambulatory Visit: Payer: Self-pay | Admitting: Internal Medicine

## 2022-07-21 DIAGNOSIS — M79672 Pain in left foot: Secondary | ICD-10-CM | POA: Diagnosis not present

## 2022-07-21 DIAGNOSIS — M21622 Bunionette of left foot: Secondary | ICD-10-CM | POA: Diagnosis not present

## 2022-07-21 DIAGNOSIS — M2012 Hallux valgus (acquired), left foot: Secondary | ICD-10-CM | POA: Diagnosis not present

## 2022-07-24 ENCOUNTER — Other Ambulatory Visit: Payer: Self-pay | Admitting: Internal Medicine

## 2022-08-13 ENCOUNTER — Other Ambulatory Visit: Payer: Self-pay | Admitting: Internal Medicine

## 2022-08-27 ENCOUNTER — Encounter: Payer: Self-pay | Admitting: Internal Medicine

## 2022-08-28 DIAGNOSIS — M216X2 Other acquired deformities of left foot: Secondary | ICD-10-CM | POA: Diagnosis not present

## 2022-08-28 DIAGNOSIS — M21622 Bunionette of left foot: Secondary | ICD-10-CM | POA: Diagnosis not present

## 2022-08-28 DIAGNOSIS — M2012 Hallux valgus (acquired), left foot: Secondary | ICD-10-CM | POA: Diagnosis not present

## 2022-08-28 DIAGNOSIS — G8918 Other acute postprocedural pain: Secondary | ICD-10-CM | POA: Diagnosis not present

## 2022-08-28 NOTE — Telephone Encounter (Signed)
Okay to refill, lets wait for patient's answer

## 2022-08-29 MED ORDER — CYCLOBENZAPRINE HCL 5 MG PO TABS: 5.0000 mg | ORAL_TABLET | Freq: Every evening | ORAL | 1 refills | Status: AC | PRN

## 2022-08-29 NOTE — Addendum Note (Signed)
Addended byDamita Dunnings D on: 08/29/2022 07:45 AM   Modules accepted: Orders

## 2022-09-04 ENCOUNTER — Other Ambulatory Visit: Payer: Self-pay | Admitting: Internal Medicine

## 2022-09-25 ENCOUNTER — Telehealth: Payer: Self-pay | Admitting: *Deleted

## 2022-09-25 ENCOUNTER — Encounter: Payer: Self-pay | Admitting: *Deleted

## 2022-09-25 NOTE — Patient Outreach (Signed)
  Care Coordination   Initial Visit Note   09/25/2022 Name: Sheila Fuller MRN: [id number] DOB: January 31, 1956  Sheila Fuller is a 66 y.o. year old female who sees Larose Kells, Alda Berthold, MD for primary care. I spoke with  Idolina Primer by phone today.  What matters to the patients health and wellness today?  "I am doing fine.  I had recent bunion surgery on my (L) foot and I have recuperated fine.... I am pretty much back to normal now.  I am all caught up on my vaccines, am still considering and probably going to get the RSV vaccine-- but I have gotten the flu and new COVID vaccines.  I have not been checking my blood pressures at home, however, I will try to start checking every now and then, I know Dr. Larose Kells wants me to check them.  I am eating healthy, I pretty much follow a low salt and heart healthy diet.  Interventions provided, no further or ongoing care coordination needs identified today   Goals Addressed             This Visit's Progress    COMPLETED: Care Coordination Activities: No follow up required   On track    Care Coordination Interventions: Evaluation of current treatment plan related to HTN and patient's adherence to plan as established by provider Advised patient to provide appropriate vaccination information to provider or CM team member at next visit Advised patient to begin monitoring/ recording blood pressures at home; encouraged her to monitor periodically in different circumstances- post-activity; while at rest; during periods of stress etc; reviewed last PCP office visit and stated BP goals with patient: BP goal per dr. Larose Kells: 110-135/ 65-85  Provided education to patient re: benefits of following low salt heart healthy diet Reviewed medications with patient and discussed adherence: she reports excellent adherence and confirms that she self-manages medications; denies medication questions Reviewed scheduled/upcoming provider appointments including none Assessed social  determinant of health barriers Confirmed patient has obtained 2023-24 flu and new COVID vaccines; encouraged patient to consider obtaining RSV vaccine; discussed recent foot surgery on (L) foot for bunion removal          SDOH assessments and interventions completed:  Yes  SDOH Interventions Today    Flowsheet Row Most Recent Value  SDOH Interventions   Food Insecurity Interventions Intervention Not Indicated  Transportation Interventions Intervention Not Indicated  [drives self]       Care Coordination Interventions:  Yes, provided   Follow up plan: No further intervention required.   Encounter Outcome:  Pt. Visit Completed   Oneta Rack, RN, BSN, CCRN Alumnus RN CM Care Coordination/ Transition of [place] Management [phone]: direct office

## 2022-09-25 NOTE — Patient Instructions (Signed)
Visit Information  Thank you for taking time to visit with me today. Please don't hesitate to contact me if I can be of assistance to you.   Following are the goals we discussed today:   Goals Addressed             This Visit's Progress    COMPLETED: Care Coordination Activities: No follow up required   On track    Care Coordination Interventions: Evaluation of current treatment plan related to HTN and patient's adherence to plan as established by provider Advised patient to provide appropriate vaccination information to provider or CM team member at next visit Advised patient to begin monitoring/ recording blood pressures at home; encouraged her to monitor periodically in different circumstances- post-activity; while at rest; during periods of stress etc; reviewed last PCP office visit and stated BP goals with patient: BP goal per dr. Larose Kells: 110-135/ 65-85  Provided education to patient re: benefits of following low salt heart healthy diet Reviewed medications with patient and discussed adherence: she reports excellent adherence and confirms that she self-manages medications; denies medication questions Reviewed scheduled/upcoming provider appointments including none Assessed social determinant of health barriers Confirmed patient has obtained 2023-24 flu and new COVID vaccines; encouraged patient to consider obtaining RSV vaccine; discussed recent foot surgery on (L) foot for bunion removal          If you are experiencing a Mental Health or [place] or need someone to talk to, please  call the Suicide and Crisis Lifeline: 988 call the [place] National Suicide Prevention Lifeline: 682-492-6294 or TTY: 573-338-4204 TTY 225 508 7905) to talk to a trained counselor call 1-800-273-TALK (toll free, 24 hour hotline) go to [facility] [address] [place] [phone]) call the [place]: [phone] call 911    Patient verbalizes understanding of instructions and care plan provided today and agrees to view in [place]. Active MyChart status and patient understanding of how to access instructions and care plan via MyChart confirmed with patient.     No further follow up required: no further or ongoing care coordination needs identified   Oneta Rack, RN, BSN, CCRN Alumnus RN CM Care Coordination/ Transition of [facility] [phone]: direct office

## 2022-09-26 DIAGNOSIS — M21622 Bunionette of left foot: Secondary | ICD-10-CM | POA: Diagnosis not present

## 2022-09-26 DIAGNOSIS — M2012 Hallux valgus (acquired), left foot: Secondary | ICD-10-CM | POA: Diagnosis not present

## 2022-10-16 ENCOUNTER — Encounter: Payer: Self-pay | Admitting: Internal Medicine

## 2022-10-24 DIAGNOSIS — M2012 Hallux valgus (acquired), left foot: Secondary | ICD-10-CM | POA: Diagnosis not present

## 2022-10-24 DIAGNOSIS — M21622 Bunionette of left foot: Secondary | ICD-10-CM | POA: Diagnosis not present

## 2022-10-31 ENCOUNTER — Telehealth: Payer: BC Managed Care – PPO | Admitting: Family Medicine

## 2022-10-31 DIAGNOSIS — J069 Acute upper respiratory infection, unspecified: Secondary | ICD-10-CM

## 2022-10-31 MED ORDER — FLUTICASONE PROPIONATE 50 MCG/ACT NA SUSP: 2.0000 | Freq: Every day | NASAL | 6 refills | Status: DC

## 2022-10-31 MED ORDER — BENZONATATE 200 MG PO CAPS: 200.0000 mg | ORAL_CAPSULE | Freq: Two times a day (BID) | ORAL | 0 refills | Status: DC | PRN

## 2022-10-31 NOTE — Progress Notes (Signed)

## 2023-01-05 DIAGNOSIS — M546 Pain in thoracic spine: Secondary | ICD-10-CM | POA: Diagnosis not present

## 2023-01-05 DIAGNOSIS — M9902 Segmental and somatic dysfunction of thoracic region: Secondary | ICD-10-CM | POA: Diagnosis not present

## 2023-01-12 ENCOUNTER — Other Ambulatory Visit: Payer: Self-pay | Admitting: Internal Medicine

## 2023-02-19 ENCOUNTER — Telehealth: Payer: Self-pay | Admitting: Internal Medicine

## 2023-02-19 DIAGNOSIS — Z780 Asymptomatic menopausal state: Secondary | ICD-10-CM

## 2023-02-19 NOTE — Telephone Encounter (Signed)
Pt requested bone density order to be placed. Please give her a call to advise when she can call to be scheduled.

## 2023-02-19 NOTE — Telephone Encounter (Signed)
Please advise 

## 2023-02-20 NOTE — Telephone Encounter (Signed)
Schedule a bone density test, Dx early menopause

## 2023-02-20 NOTE — Telephone Encounter (Signed)
Spoke w/ Pt- informed bone density has been ordered, telephone number to imaging given to Pt.

## 2023-02-20 NOTE — Addendum Note (Signed)
Addended byConrad La Bolt D on: 02/20/2023 07:50 AM   Modules accepted: Orders

## 2023-02-20 NOTE — Telephone Encounter (Signed)
Order placed. Will call Pt shortly.

## 2023-03-24 ENCOUNTER — Ambulatory Visit: Payer: BC Managed Care – PPO | Admitting: Internal Medicine

## 2023-03-24 VITALS — BP 126/80 | HR 53 | Temp 98.0°F | Resp 16 | Ht 68.0 in | Wt 178.4 lb

## 2023-03-24 DIAGNOSIS — K6289 Other specified diseases of anus and rectum: Secondary | ICD-10-CM | POA: Diagnosis not present

## 2023-03-24 DIAGNOSIS — L539 Erythematous condition, unspecified: Secondary | ICD-10-CM

## 2023-03-24 DIAGNOSIS — R399 Unspecified symptoms and signs involving the genitourinary system: Secondary | ICD-10-CM | POA: Diagnosis not present

## 2023-03-24 MED ORDER — HYDROCORTISONE ACETATE 25 MG RE SUPP: 25.0000 mg | Freq: Two times a day (BID) | RECTAL | 1 refills | Status: DC | PRN

## 2023-03-24 NOTE — Progress Notes (Signed)
Subjective:    Patient ID: Sheila Fuller, female    DOB: 1956/03/15, 67 y.o.   MRN: [id number]  DOS:  03/24/2023 Type of visit - description:acute    Suspect she has a UTI for the last 2 months, her urine smelled funny, from time to time it gets orange color. She denies dysuria or gross hematuria Urine has not been mildly at any time or having any solid receded.  She thought she could have a "fistula" because the colonoscopy but  she had a fissure. From time to time she has a rectal discomfort when he has a bowel movement. No actual bleeding.  Occasionally the area feels slightly swollen, she also has noticed some stool leaking.     Review of Systems See above   Past Medical History:  Diagnosis Date   Allergic rhinitis    Allergy    Anxiety    Arthritis    Asthma    seasonally   BRCA1 positive    Cancer (HCC)    skin   Carcinoma in situ of perianal skin    +XRT & chemo 2000; no further f/u w/ specialist.    Cataract    will remove left end of 10-2016   Class 1 obesity    Depression    Family history of BRCA gene mutation    daughter BRCA1 +   Family history of breast cancer    Family history of pancreatic cancer    GERD (gastroesophageal reflux disease)    takes zantac maybe 2/week   Hyperlipidemia    Hypertension    Lactose intolerance 10/04/2013   Microhematuria 01/2006   saw Dr.Peterson, renal u/s: r pyelocaliectasis but CT was neg, cysto neg   Shortness of breath    only with asthma flare up   Sleep apnea    rarely uses cpap    Past Surgical History:  Procedure Laterality Date   ANTERIOR CERVICAL DECOMP/DISCECTOMY FUSION N/A 08/23/2014   Procedure: C5-6 Anterior Cervical Discectomy and Fusion, Allograft, Plate;  Surgeon: Eldred Manges, MD;  Location: [facility] OR;  Service: Orthopedics;  Laterality: N/A;   BREAST SURGERY  09/2018   BRCA-1 gene positive, +reconstruction   cervical cauterization     CESAREAN SECTION     GANGLION CYST EXCISION     MASTECTOMY  Bilateral ~ 1/22020   NASAL SEPTUM SURGERY     POLYPECTOMY     Revision  breast reconstruction surgery 03/01/2019  02/2021   ROBOTIC ASSISTED BILATERAL SALPINGO OOPHERECTOMY Bilateral 11/24/2017   Procedure: XI ROBOTIC ASSISTED BILATERAL SALPINGO OOPHORECTOMY;  Surgeon: Adolphus Birchwood, MD;  Location: [facility] ORS;  Service: Gynecology;  Laterality: Bilateral;    Current Outpatient Medications  Medication Instructions   albuterol (PROAIR HFA) 108 (90 Base) MCG/ACT inhaler 2 puffs, Inhalation, Every 4 hours PRN   benzonatate (TESSALON) 200 mg, Oral, 2 times daily PRN   buPROPion (WELLBUTRIN XL) 150 mg, Oral, Daily   chlorthalidone (HYGROTON) 25 mg, Oral, Daily   cyclobenzaprine (FLEXERIL) 5 mg, Oral, At bedtime PRN   desloratadine (CLARINEX) 5 mg, Oral, Daily PRN   esomeprazole (NEXIUM) 20 mg, Daily   ezetimibe (ZETIA) 10 mg, Oral, Daily   fluticasone (FLONASE) 50 MCG/ACT nasal spray 2 sprays, Each Nare, Daily   losartan (COZAAR) 100 mg, Oral, Daily   metoprolol tartrate (LOPRESSOR) 50 mg, Oral, Daily   naproxen (NAPROSYN) 500 MG tablet TAKE 1 TABLET DAILY AS NEEDED FOR MODERATE PAIN   psyllium (REGULOID) 0.52 g, Oral, Daily  TYRVAYA 0.03 MG/ACT SOLN 2 sprays, Each Nare, Daily       Objective:   Physical Exam BP 126/80   Pulse (!) 53   Temp 98 F (36.7 C) (Oral)   Resp 16   Ht 5\' 8"  (1.727 m)   Wt 178 lb 6 oz (80.9 kg)   SpO2 96%   BMI 27.12 kg/m  General:   Well developed, NAD, BMI noted. HEENT:  Normocephalic . Face symmetric, atraumatic GU: Inspection of the vaginal area is normal with no discharge. Anoscopy: With the patient permission and with my assistant present in the room, I introduced a self lighting anoscope, the exam is limited by abundant  stools however there is area of erythema and denudation around 1 cm in size, irregular shape located at the right posterior side of the anus. Lower extremities: no pretibial edema bilaterally  Skin: Not pale. Not  jaundice Neurologic:  alert & oriented X3.  Speech normal, gait appropriate for age and unassisted Psych--  Cognition and judgment appear intact.  Cooperative with normal attention span and concentration.  Behavior appropriate. No anxious or depressed appearing.      Assessment     Assessment Hyperglycemia HTN Hyperlipidemia (statin intolerant) Anxiety, depression GERD Seasonal asthma-Allergies Obesity : BMI~ 28 plus HTN, hyperlipidemia, OSA OSA: on Cpap consistently as off 04-2021 MSK: DJD, occasionally uses pain medication H/o Microhematuria 2007, Dr. Vonita Moss, renal US showed right pyelocaliectasis, CT was negative, cystoscopy negative + BRCA: personal h/o (dx 2018)  and FH BRCA (daughter, sister).  See office visit 10/14/2017 Bilateral mastectomy 09/2018 H/o Carcinoma in situ, perianal skin, XRT, chemotherapy 2000. Not further follow-ups with specialist HOH: has hearing aids B   PLAN:  UTI?:  Check UA urine culture.  The patient is concerned about a "fistula".  No evidence of that. Anal discomfort-erythema: On exam there is a area of erythema and denudation of the membrane.  Proctitis? Recommend Anusol HC and referred to GI for further examination. (I did review the colonoscopy report from 08/05/2021: 2 mm polyp descending colon.  Tubular adenoma. Question of a healed anal fissure posterior midline.)

## 2023-03-24 NOTE — Assessment & Plan Note (Signed)
UTI?:  Check UA urine culture.  The patient is concerned about a "fistula".  No evidence of that. Anal discomfort-erythema: On exam there is a area of erythema and denudation of the membrane.  Proctitis? Recommend Anusol HC and referred to GI for further examination. (I did review the colonoscopy report from 08/05/2021: 2 mm polyp descending colon.  Tubular adenoma. Question of a healed anal fissure posterior midline.)

## 2023-03-24 NOTE — Patient Instructions (Addendum)
Provide a urine sample  Use the suppositories twice daily for few days then as needed.  We are referring you to the gastroenterology office, you can reach them at [phone]

## 2023-03-25 LAB — URINALYSIS, ROUTINE W REFLEX MICROSCOPIC
Bilirubin Urine: NEGATIVE
Ketones, ur: NEGATIVE
Nitrite: NEGATIVE
Specific Gravity, Urine: 1.01 (ref 1.000–1.030)
Total Protein, Urine: NEGATIVE
Urine Glucose: NEGATIVE
Urobilinogen, UA: 0.2 (ref 0.0–1.0)
pH: 6.5 (ref 5.0–8.0)

## 2023-03-27 ENCOUNTER — Other Ambulatory Visit: Payer: Self-pay | Admitting: Internal Medicine

## 2023-03-27 LAB — URINE CULTURE
MICRO NUMBER:: 15008529
SPECIMEN QUALITY:: ADEQUATE

## 2023-03-27 MED ORDER — AMOXICILLIN-POT CLAVULANATE 875-125 MG PO TABS: 1.0000 | ORAL_TABLET | Freq: Two times a day (BID) | ORAL | 0 refills | Status: DC

## 2023-04-02 DIAGNOSIS — M9903 Segmental and somatic dysfunction of lumbar region: Secondary | ICD-10-CM | POA: Diagnosis not present

## 2023-04-02 DIAGNOSIS — M545 Low back pain, unspecified: Secondary | ICD-10-CM | POA: Diagnosis not present

## 2023-04-02 DIAGNOSIS — M546 Pain in thoracic spine: Secondary | ICD-10-CM | POA: Diagnosis not present

## 2023-04-02 DIAGNOSIS — M9902 Segmental and somatic dysfunction of thoracic region: Secondary | ICD-10-CM | POA: Diagnosis not present

## 2023-04-02 DIAGNOSIS — M9901 Segmental and somatic dysfunction of cervical region: Secondary | ICD-10-CM | POA: Diagnosis not present

## 2023-04-06 ENCOUNTER — Other Ambulatory Visit: Payer: Self-pay | Admitting: Internal Medicine

## 2023-04-10 ENCOUNTER — Telehealth: Payer: Self-pay | Admitting: Internal Medicine

## 2023-04-10 MED ORDER — NITROFURANTOIN MONOHYD MACRO 100 MG PO CAPS: 100.0000 mg | ORAL_CAPSULE | Freq: Two times a day (BID) | ORAL | 0 refills | Status: DC

## 2023-04-10 NOTE — Telephone Encounter (Signed)
Urine culture 03/24/2023 pansensitive E. coli.  Was Rx Augmentin. Please send Macrobid 100 mg twice daily #10 no refills. Advised patient to push fluids and come back in 10 days to 2 weeks if not better

## 2023-04-10 NOTE — Telephone Encounter (Signed)
Please advise 

## 2023-04-10 NOTE — Telephone Encounter (Signed)
Spoke w/ Pt- informed of recommendations. Pt verbalized understanding. Rx sent to [facility] on [address].

## 2023-04-10 NOTE — Addendum Note (Signed)
Addended byConrad Protection D on: 04/10/2023 01:28 PM   Modules accepted: Orders

## 2023-04-10 NOTE — Telephone Encounter (Signed)
Patient called and came in last week for a UTI and states still feels the same and testing positive for UTI. She would like more antibiotic sent to pharmacy

## 2023-05-19 DIAGNOSIS — M546 Pain in thoracic spine: Secondary | ICD-10-CM | POA: Diagnosis not present

## 2023-05-19 DIAGNOSIS — M9903 Segmental and somatic dysfunction of lumbar region: Secondary | ICD-10-CM | POA: Diagnosis not present

## 2023-05-19 DIAGNOSIS — M545 Low back pain, unspecified: Secondary | ICD-10-CM | POA: Diagnosis not present

## 2023-05-19 DIAGNOSIS — M9901 Segmental and somatic dysfunction of cervical region: Secondary | ICD-10-CM | POA: Diagnosis not present

## 2023-05-19 DIAGNOSIS — M9902 Segmental and somatic dysfunction of thoracic region: Secondary | ICD-10-CM | POA: Diagnosis not present

## 2023-05-27 ENCOUNTER — Encounter (INDEPENDENT_AMBULATORY_CARE_PROVIDER_SITE_OTHER): Payer: Self-pay

## 2023-06-01 ENCOUNTER — Other Ambulatory Visit: Payer: Self-pay | Admitting: Internal Medicine

## 2023-06-08 ENCOUNTER — Other Ambulatory Visit: Payer: Self-pay | Admitting: Internal Medicine

## 2023-06-09 ENCOUNTER — Ambulatory Visit: Payer: BC Managed Care – PPO | Admitting: Gastroenterology

## 2023-06-09 ENCOUNTER — Encounter: Payer: Self-pay | Admitting: Gastroenterology

## 2023-06-09 VITALS — BP 100/70 | HR 64 | Ht 67.5 in | Wt 184.1 lb

## 2023-06-09 DIAGNOSIS — K5900 Constipation, unspecified: Secondary | ICD-10-CM | POA: Diagnosis not present

## 2023-06-09 DIAGNOSIS — Z85048 Personal history of other malignant neoplasm of rectum, rectosigmoid junction, and anus: Secondary | ICD-10-CM

## 2023-06-09 DIAGNOSIS — Z86010 Personal history of colonic polyps: Secondary | ICD-10-CM | POA: Diagnosis not present

## 2023-06-09 DIAGNOSIS — K625 Hemorrhage of anus and rectum: Secondary | ICD-10-CM

## 2023-06-09 DIAGNOSIS — Z860101 Personal history of adenomatous and serrated colon polyps: Secondary | ICD-10-CM

## 2023-06-09 NOTE — Patient Instructions (Signed)
Continue to stay on your stool softer. If that seems to not work please start Miralax daily. Increase your fluid intake as well.  Use your suppository twice a day as needed.  RECTAL CARE INSTRUCTIONS:  1. Sitz Baths twice a day for 10 minutes each. 2. Thoroughly clean and dry the rectum. 3. Put Tucks pad against the rectum at night. 4. Clean the rectum with Balenol lotion after each bowel movement.   _______________________________________________________  If your blood pressure at your visit was 140/90 or greater, please contact your primary care physician to follow up on this.  _______________________________________________________  If you are age 58 or older, your body mass index should be between 23-30. Your Body mass index is 28.41 kg/m. If this is out of the aforementioned range listed, please consider follow up with your Primary Care Provider.  If you are age 25 or younger, your body mass index should be between 19-25. Your Body mass index is 28.41 kg/m. If this is out of the aformentioned range listed, please consider follow up with your Primary Care Provider.   ________________________________________________________  The  GI providers would like to encourage you to use [facility] to communicate with providers for non-urgent requests or questions.  Due to long hold times on the telephone, sending your provider a message by [facility] may be a faster and more efficient way to get a response.  Please allow 48 business hours for a response.  Please remember that this is for non-urgent requests.  _______________________________________________________   It was a pleasure to see you today!  Thank you for trusting me with your gastrointestinal care!

## 2023-06-09 NOTE — Progress Notes (Addendum)
Assessment     Small volume anal/rectal bleeding secondary to perianal dermatitis vs. hemorrhoids.  Perianal dermatitis has resolved. Mild constipation Personal history of adenomatous colon polyps.  Last colonoscopy in Oct. 2022 with 1 small TA.  History of anal cancer (in situ), 2000, S/P chemo and XRT.  Distal rectal edema noted on Oct. 2022 colonscopy.    Recommendations    Anusol supp or cream PR bid prn. Rectal care instructions. Increase daily fiber, water intake. Continue daily stool softener. If constipation persists then start Miralax qd, titrate dose for a complete BM qd Surveillance colonoscopy recommended in October 2029 GI follow up prn Return to PCP for ongoing care   HPI    This is a 67 year old female with a personal history of adenomatous colon polyps, a history of anal cancer with minor rectal bleeding and constipation.  Patient of Dr. Christella Hartigan.  She was seen last in October 2022 and at that time she described intermittent anal discomfort with itching, anal discomfort, minor bleeding. This happens about twice per month treated with which hazel wipes and sitz baths.  Colonoscopy in Oct. 2022 was revealing for distal rectal edematous mucosa.  She relates the recent onset of small volume anal/rectal bleeding and constipation.  She describes small amounts of bright red blood on her underwear or pad not associated with bowel movements.  She relates several weeks of small volumes of stool output for 2 to 3 days and then a more complete bowel movement.  She was evaluated by Dr. Drue Novel who noted perianal erythema and denudation measuring about 1 cm on the right posterior aspect of the anus.  He felt the bleeding was due to the her perianal dermatitis.  She was treated with Anusol supp and stool softeners and her symptoms resolved symptoms.    Labs / Imaging       Latest Ref Rng & Units 06/03/2022    8:33 AM 05/24/2021    7:29 AM 05/03/2020    7:19 AM  Hepatic Function  Total  Protein 6.0 - 8.3 g/dL 6.9  6.7    Albumin 3.5 - 5.2 g/dL 4.4  4.2    AST 0 - 37 U/L 17  15  17    ALT 0 - 35 U/L 17  15  23    Alk Phosphatase 39 - 117 U/L 64  67    Total Bilirubin 0.2 - 1.2 mg/dL 0.5  0.5         Latest Ref Rng & Units 06/03/2022    8:33 AM 05/24/2021    7:29 AM 03/14/2020    9:17 AM  CBC  WBC 4.0 - 10.5 K/uL 6.7  5.7  6.8   Hemoglobin 12.0 - 15.0 g/dL 16.1  09.6  04.5   Hematocrit 36.0 - 46.0 % 42.5  43.1  42.0   Platelets 150.0 - 400.0 K/uL 233.0  222.0  239.0    Current Medications, Allergies, Past Medical History, Past Surgical History, Family History and Social History were reviewed in [employer] record.   Physical Exam: General: Well developed, well nourished, no acute distress Head: Normocephalic and atraumatic Eyes: Sclerae anicteric, EOMI Ears: Normal auditory acuity Mouth: No deformities or lesions noted Lungs: Clear throughout to auscultation Heart: Regular rate and rhythm; No murmurs, rubs or bruits Abdomen: Soft, non tender and non distended. No masses, hepatosplenomegaly or hernias noted. Normal Bowel sounds Rectal: No perianal or anal abnormalities, no tenderness, firm brown stool in the vault Anoscopy: Small  RP internal hemorrhoid, otherwise normal exam Musculoskeletal: Symmetrical with no gross deformities  Pulses:  Normal pulses noted Extremities: No edema or deformities noted Neurological: Alert oriented x 4, grossly nonfocal Psychological:  Alert and cooperative. Normal mood and affect    T. Russella Dar, MD 06/09/2023, 8:00 AM

## 2023-06-10 ENCOUNTER — Encounter: Payer: Self-pay | Admitting: Internal Medicine

## 2023-06-10 ENCOUNTER — Ambulatory Visit (INDEPENDENT_AMBULATORY_CARE_PROVIDER_SITE_OTHER): Payer: BC Managed Care – PPO | Admitting: Internal Medicine

## 2023-06-10 VITALS — BP 110/80 | HR 58 | Temp 98.1°F | Resp 18 | Ht 67.5 in | Wt 183.6 lb

## 2023-06-10 DIAGNOSIS — G4733 Obstructive sleep apnea (adult) (pediatric): Secondary | ICD-10-CM | POA: Diagnosis not present

## 2023-06-10 DIAGNOSIS — E559 Vitamin D deficiency, unspecified: Secondary | ICD-10-CM | POA: Diagnosis not present

## 2023-06-10 DIAGNOSIS — I10 Essential (primary) hypertension: Secondary | ICD-10-CM

## 2023-06-10 DIAGNOSIS — F5101 Primary insomnia: Secondary | ICD-10-CM | POA: Diagnosis not present

## 2023-06-10 DIAGNOSIS — R739 Hyperglycemia, unspecified: Secondary | ICD-10-CM | POA: Diagnosis not present

## 2023-06-10 DIAGNOSIS — F419 Anxiety disorder, unspecified: Secondary | ICD-10-CM

## 2023-06-10 DIAGNOSIS — Z0001 Encounter for general adult medical examination with abnormal findings: Secondary | ICD-10-CM

## 2023-06-10 DIAGNOSIS — E785 Hyperlipidemia, unspecified: Secondary | ICD-10-CM | POA: Diagnosis not present

## 2023-06-10 DIAGNOSIS — F32A Depression, unspecified: Secondary | ICD-10-CM

## 2023-06-10 DIAGNOSIS — Z0000 Encounter for general adult medical examination without abnormal findings: Secondary | ICD-10-CM | POA: Diagnosis not present

## 2023-06-10 LAB — LIPID PANEL
Cholesterol: 261 mg/dL — ABNORMAL HIGH (ref 0–200)
HDL: 63.3 mg/dL (ref >39.00)
NonHDL: 198.07
Total CHOL/HDL Ratio: 4
Triglycerides: 357 mg/dL — ABNORMAL HIGH (ref 0.0–149.0)
VLDL: 71.4 mg/dL — ABNORMAL HIGH (ref 0.0–40.0)

## 2023-06-10 LAB — COMPREHENSIVE METABOLIC PANEL
ALT: 16 U/L (ref 0–35)
AST: 15 U/L (ref 0–37)
Albumin: 4.3 g/dL (ref 3.5–5.2)
Alkaline Phosphatase: 71 U/L (ref 39–117)
BUN: 22 mg/dL (ref 6–23)
CO2: 29 mEq/L (ref 19–32)
Calcium: 9.1 mg/dL (ref 8.4–10.5)
Chloride: 101 mEq/L (ref 96–112)
Creatinine, Ser: 1.08 mg/dL (ref 0.40–1.20)
GFR: 53.56 mL/min — ABNORMAL LOW (ref >60.00)
Glucose, Bld: 82 mg/dL (ref 70–99)
Potassium: 3.8 mEq/L (ref 3.5–5.1)
Sodium: 138 mEq/L (ref 135–145)
Total Bilirubin: 0.5 mg/dL (ref 0.2–1.2)
Total Protein: 6.8 g/dL (ref 6.0–8.3)

## 2023-06-10 LAB — CBC WITH DIFFERENTIAL/PLATELET
Basophils Absolute: 0.1 10*3/uL (ref 0.0–0.1)
Basophils Relative: 1.3 % (ref 0.0–3.0)
Eosinophils Absolute: 0.2 10*3/uL (ref 0.0–0.7)
Eosinophils Relative: 2.2 % (ref 0.0–5.0)
HCT: 44.6 % (ref 36.0–46.0)
Hemoglobin: 14.9 g/dL (ref 12.0–15.0)
Lymphocytes Relative: 31.6 % (ref 12.0–46.0)
Lymphs Abs: 2.3 10*3/uL (ref 0.7–4.0)
MCHC: 33.4 g/dL (ref 30.0–36.0)
MCV: 92.8 fl (ref 78.0–100.0)
Monocytes Absolute: 0.5 10*3/uL (ref 0.1–1.0)
Monocytes Relative: 7.1 % (ref 3.0–12.0)
Neutro Abs: 4.1 10*3/uL (ref 1.4–7.7)
Neutrophils Relative %: 57.8 % (ref 43.0–77.0)
Platelets: 259 10*3/uL (ref 150.0–400.0)
RBC: 4.81 Mil/uL (ref 3.87–5.11)
RDW: 13.4 % (ref 11.5–15.5)
WBC: 7.2 10*3/uL (ref 4.0–10.5)

## 2023-06-10 LAB — HEMOGLOBIN A1C: Hgb A1c MFr Bld: 6.1 % (ref 4.6–6.5)

## 2023-06-10 LAB — VITAMIN D 25 HYDROXY (VIT D DEFICIENCY, FRACTURES): VITD: 24.33 ng/mL — ABNORMAL LOW (ref 30.00–100.00)

## 2023-06-10 LAB — LDL CHOLESTEROL, DIRECT: Direct LDL: 202 mg/dL

## 2023-06-10 MED ORDER — DESLORATADINE 5 MG PO TABS: 5.0000 mg | ORAL_TABLET | Freq: Every day | ORAL | 2 refills | Status: AC | PRN

## 2023-06-10 NOTE — Patient Instructions (Addendum)
Vaccines I recommend Get a flu shot this fall Get a COVID booster   Check the  blood pressure regularly Blood pressure goal:  between 110/65 and  135/85. If it is consistently higher or lower, let me know  Take vitamin D over-the-counter, 2000 units daily.     GO TO THE LAB : Get the blood work     GO TO THE FRONT DESK, PLEASE SCHEDULE YOUR APPOINTMENTS Come back for   a physical exam in 1 year.  Sooner if needed       "Health Care Power of attorney" ,  "Living will" (Advance care planning documents)  If you already have a living will or healthcare power of attorney, is recommended you bring the copy to be scanned in your chart.   The document will be available to all the doctors you see in the system.  Advance care planning is a process that supports adults in  understanding and sharing their preferences regarding future medical care.  The patient's preferences are recorded in documents called Advance Directives and the can be modified at any time while the patient is in full mental capacity.   If you don't have one, please consider create one.      More information at: [email or web address]

## 2023-06-10 NOTE — Assessment & Plan Note (Signed)
Here for CPX  Anal discomfort erythema: See last office visit here, subsequently saw GI , small-volume and rectal bleeding likely due to dermatitis versus hemorrhoids.  Dermatitis resolved. Hyperglycemia: Has healthy lifestyle, check A1c HTN: On chlorthalidone, losartan, metoprolol.  Check CBC and CMP. Hyperlipidemia: Statin intolerant, on Zetia, check FLP. Vitamin D deficiency: Checking levels, encourage supplements. Anxiety depression: On Wellbutrin, controlled. OSA: Reports good CPAP compliance. Seasonal allergies: Request a refill on Clarinex.  Sent RTC 1 year.

## 2023-06-10 NOTE — Assessment & Plan Note (Signed)
Here for CPX -Td 10/2018 - Pneumonia shot 2014; prevnar 2016; PNM 20: 2023 -  Shingrix completed 2020 - recommend: RSV, flu shot  (fall), covid booster in September  -CCS: Cscopes 2003 , 2007 (Tubullovillous polyps) , 2012,  09-2016 and 07/2021 , next October 2029 per GI note. - female care per gyn:  *s/p oophorectomy 2018   *PAP 2021, was rec no further PAPs *Breast ca screening: s/p B  Mastectomy, d/t BRCA , no further MMG - Dexa 12-2006 , 8/5/020: Normal; h/o early menopause.  Next DEXA d/w pt  we agreed to proceed 2025  - former smoker: Never heavy smoker, again not interested on CT chest for lung cancer screening.  Reportedly (-)  genetic testing for lung cancer -Labs: CMP FLP CBC A1c vitamin D -Diet and exercise d/w pt  -Healthcare POA: On file

## 2023-06-10 NOTE — Progress Notes (Signed)
Subjective:    Patient ID: Sheila Fuller, female    DOB: 29-May-1956, 67 y.o.   MRN: [id number]  DOS:  06/10/2023 Type of visit - description: CPX Here for CPX.  Has no concerns.  Review of Systems   A 14 point review of systems is negative     Past Medical History:  Diagnosis Date   Allergic rhinitis    Allergy    Anxiety    Arthritis    Asthma    seasonally   BRCA1 positive    Cancer (HCC)    skin   Carcinoma in situ of perianal skin    +XRT & chemo 2000; no further f/u w/ specialist.    Cataract    will remove left end of 10-2016   Class 1 obesity    Depression    Family history of BRCA gene mutation    daughter BRCA1 +   Family history of breast cancer    Family history of pancreatic cancer    GERD (gastroesophageal reflux disease)    takes zantac maybe 2/week   Hyperlipidemia    Hypertension    Lactose intolerance 10/04/2013   Microhematuria 01/2006   saw Dr.Peterson, renal u/s: r pyelocaliectasis but CT was neg, cysto neg   Shortness of breath    only with asthma flare up   Sleep apnea    rarely uses cpap    Past Surgical History:  Procedure Laterality Date   ANTERIOR CERVICAL DECOMP/DISCECTOMY FUSION N/A 08/23/2014   Procedure: C5-6 Anterior Cervical Discectomy and Fusion, Allograft, Plate;  Surgeon: Eldred Manges, MD;  Location: [facility] OR;  Service: Orthopedics;  Laterality: N/A;   BREAST SURGERY  09/2018   BRCA-1 gene positive, +reconstruction   cervical cauterization     CESAREAN SECTION     GANGLION CYST EXCISION     MASTECTOMY Bilateral ~ 1/22020   NASAL SEPTUM SURGERY     POLYPECTOMY     Revision  breast reconstruction surgery 03/01/2019  02/2021   ROBOTIC ASSISTED BILATERAL SALPINGO OOPHERECTOMY Bilateral 11/24/2017   Procedure: XI ROBOTIC ASSISTED BILATERAL SALPINGO OOPHORECTOMY;  Surgeon: Adolphus Birchwood, MD;  Location: [facility] ORS;  Service: Gynecology;  Laterality: Bilateral;   Social History   Social History Narrative   remarried 10-11, 1 daughter      Current Outpatient Medications  Medication Instructions   albuterol (PROAIR HFA) 108 (90 Base) MCG/ACT inhaler 2 puffs, Inhalation, Every 4 hours PRN   buPROPion (WELLBUTRIN XL) 150 mg, Oral, Daily   chlorthalidone (HYGROTON) 25 mg, Oral, Daily   cyclobenzaprine (FLEXERIL) 5 mg, Oral, At bedtime PRN   D-MANNOSE PO 1 capsule, Oral, Daily   desloratadine (CLARINEX) 5 mg, Oral, Daily PRN   ezetimibe (ZETIA) 10 mg, Oral, Daily   fluticasone (FLONASE) 50 MCG/ACT nasal spray 2 sprays, Each Nare, Daily   losartan (COZAAR) 100 mg, Oral, Daily   metoprolol tartrate (LOPRESSOR) 50 mg, Oral, Daily   naproxen (NAPROSYN) 500 mg, Oral, Daily PRN   psyllium (REGULOID) 0.52 g, Oral, Daily   raNITIdine HCl (RANITIDINE 75 PO) 1 tablet, Oral, As needed       Objective:   Physical Exam BP 110/80 (BP Location: Left Arm, Patient Position: Sitting, Cuff Size: Normal)   Pulse (!) 58   Temp 98.1 F (36.7 C) (Oral)   Resp 18   Ht 5' 7.5" (1.715 m)   Wt 183 lb 9.6 oz (83.3 kg)   SpO2 95%   BMI 28.33 kg/m  General: Well developed,  NAD, BMI noted Neck: No  thyromegaly  HEENT:  Normocephalic . Face symmetric, atraumatic Lungs:  CTA B Normal respiratory effort, no intercostal retractions, no accessory muscle use. Heart: RRR,  no murmur.  Abdomen:  Not distended, soft, non-tender. No rebound or rigidity.   Lower extremities: no pretibial edema bilaterally  Skin: Exposed areas without rash. Not pale. Not jaundice Neurologic:  alert & oriented X3.  Speech normal, gait appropriate for age and unassisted Strength symmetric and appropriate for age.  Psych: Cognition and judgment appear intact.  Cooperative with normal attention span and concentration.  Behavior appropriate. No anxious or depressed appearing.      Assessment     Assessment Hyperglycemia HTN Hyperlipidemia (statin intolerant) Anxiety, depression GERD Seasonal asthma-Allergies Obesity : BMI~ 28 plus HTN, hyperlipidemia,  OSA OSA: on Cpap consistently as off 04-2021 MSK: DJD, occasionally uses pain medication H/o Microhematuria 2007, Dr. Vonita Moss, renal US showed right pyelocaliectasis, CT was negative, cystoscopy negative + BRCA: personal h/o (dx 2018)  and FH BRCA (daughter, sister).  See office visit 10/14/2017 Bilateral mastectomy 09/2018 H/o Carcinoma in situ, perianal skin, XRT, chemotherapy 2000. Not further follow-ups with specialist HOH: has hearing aids B Vitamins deficiency  PLAN:  Here for CPX -Td 10/2018 - Pneumonia shot 2014; prevnar 2016; PNM 20: 2023 -  Shingrix completed 2020 - recommend: RSV, flu shot  (fall), covid booster in September  -CCS: Cscopes 2003 , 2007 (Tubullovillous polyps) , 2012,  09-2016 and 07/2021 , next October 2029 per GI note. - female care per gyn:  *s/p oophorectomy 2018   *PAP 2021, was rec no further PAPs *Breast ca screening: s/p B  Mastectomy, d/t BRCA , no further MMG - Dexa 12-2006 , 8/5/020: Normal; h/o early menopause.  Next DEXA d/w pt  we agreed to proceed 2025  - former smoker: Never heavy smoker, again not interested on CT chest for lung cancer screening.  Reportedly (-)  genetic testing for lung cancer -Labs: CMP FLP CBC A1c vitamin D -Diet and exercise d/w pt  -Healthcare POA: On file Anal discomfort erythema: See last office visit here, subsequently saw GI , small-volume and rectal bleeding likely due to dermatitis versus hemorrhoids.  Dermatitis resolved. Hyperglycemia: Has healthy lifestyle, check A1c HTN: On chlorthalidone, losartan, metoprolol.  Check CBC and CMP. Hyperlipidemia: Statin intolerant, on Zetia, check FLP. Vitamin D deficiency: Checking levels, encourage supplements. Anxiety depression: On Wellbutrin, controlled. OSA: Reports good CPAP compliance. Seasonal allergies: Request a refill on Clarinex.  Sent RTC 1 year.

## 2023-06-12 ENCOUNTER — Telehealth: Payer: Self-pay | Admitting: Internal Medicine

## 2023-06-12 MED ORDER — FLUTICASONE PROPIONATE 50 MCG/ACT NA SUSP: 2.0000 | Freq: Every day | NASAL | 6 refills | Status: AC

## 2023-06-12 NOTE — Telephone Encounter (Signed)
Prescription Request  06/12/2023  Is this a "Controlled Substance" medicine? No  LOV: 06/10/2023  What is the name of the medication or equipment?   fluticasone (FLONASE) 50 MCG/ACT nasal spray [[id number]]  Have you contacted your pharmacy to request a refill? No   Which pharmacy would you like this sent to?   [id number] - [place], [place] - [address] [place] [address] Phone: [phone] Fax: [phone]    Patient notified that their request is being sent to the clinical staff for review and that they should receive a response within 2 business days.   Please advise at Mobile [phone] (mobile)

## 2023-06-12 NOTE — Addendum Note (Signed)
Addended byConrad Jennings D on: 06/12/2023 08:29 AM   Modules accepted: Orders

## 2023-06-12 NOTE — Telephone Encounter (Signed)
Rx sent 

## 2023-06-16 ENCOUNTER — Encounter: Payer: Self-pay | Admitting: Internal Medicine

## 2023-06-16 ENCOUNTER — Telehealth: Payer: Self-pay | Admitting: Internal Medicine

## 2023-06-16 DIAGNOSIS — E785 Hyperlipidemia, unspecified: Secondary | ICD-10-CM

## 2023-06-16 MED ORDER — VITAMIN D (ERGOCALCIFEROL) 1.25 MG (50000 UNIT) PO CAPS: 50000.0000 [IU] | ORAL_CAPSULE | ORAL | 0 refills | Status: AC

## 2023-06-16 MED ORDER — ROSUVASTATIN CALCIUM 5 MG PO TABS: 5.0000 mg | ORAL_TABLET | ORAL | 1 refills | Status: DC

## 2023-06-16 NOTE — Telephone Encounter (Signed)
Send Crestor 5 mg 1 tablet Monday Wednesday and Friday.  34-month supply. Send ergocalciferol 50,000 units 1 tablet weekly x 3 months. Future labs: FLP AST ALT total CK.  Dx high cholesterol  ==== Spoke with the patient about the results.  LDL has significantly increased despite taking Zetia. On chart review she had the following problems: Atorvastatin: Myalgia Pravachol: Memory loss Crestor: Myalgia even if taking once or twice a week  We agreed on the following: Low-dose of Crestor Labs in 6 weeks OTC co-Q10 If intolerant to Crestor rechallenge or cholesterol is still significantly elevated will refer to the lipid clinic.

## 2023-06-16 NOTE — Telephone Encounter (Signed)
Rx's sent. Labs placed.

## 2023-06-16 NOTE — Addendum Note (Signed)
Addended byConrad Johnson Siding D on: 06/16/2023 02:51 PM   Modules accepted: Orders

## 2023-06-17 ENCOUNTER — Other Ambulatory Visit: Payer: Self-pay | Admitting: Internal Medicine

## 2023-06-25 MED ORDER — ROSUVASTATIN CALCIUM 5 MG PO TABS: 5.0000 mg | ORAL_TABLET | ORAL | 1 refills | Status: DC

## 2023-07-29 ENCOUNTER — Other Ambulatory Visit (INDEPENDENT_AMBULATORY_CARE_PROVIDER_SITE_OTHER): Payer: BC Managed Care – PPO

## 2023-07-29 DIAGNOSIS — E785 Hyperlipidemia, unspecified: Secondary | ICD-10-CM

## 2023-07-29 LAB — LIPID PANEL
Cholesterol: 184 mg/dL (ref 0–200)
HDL: 69.8 mg/dL (ref >39.00)
LDL Cholesterol: 62 mg/dL (ref 0–99)
NonHDL: 114.58
Total CHOL/HDL Ratio: 3
Triglycerides: 264 mg/dL — ABNORMAL HIGH (ref 0.0–149.0)
VLDL: 52.8 mg/dL — ABNORMAL HIGH (ref 0.0–40.0)

## 2023-07-29 LAB — CK: Total CK: 90 U/L (ref 7–177)

## 2023-07-29 LAB — ALT: ALT: 24 U/L (ref 0–35)

## 2023-07-29 LAB — AST: AST: 19 U/L (ref 0–37)

## 2023-08-03 DIAGNOSIS — L814 Other melanin hyperpigmentation: Secondary | ICD-10-CM | POA: Diagnosis not present

## 2023-08-03 DIAGNOSIS — L821 Other seborrheic keratosis: Secondary | ICD-10-CM | POA: Diagnosis not present

## 2023-08-03 DIAGNOSIS — D1801 Hemangioma of skin and subcutaneous tissue: Secondary | ICD-10-CM | POA: Diagnosis not present

## 2023-08-10 ENCOUNTER — Other Ambulatory Visit: Payer: Self-pay | Admitting: Internal Medicine

## 2023-08-21 ENCOUNTER — Other Ambulatory Visit: Payer: Self-pay | Admitting: Internal Medicine

## 2023-09-10 ENCOUNTER — Encounter: Payer: Self-pay | Admitting: Internal Medicine

## 2023-10-07 ENCOUNTER — Other Ambulatory Visit: Payer: Self-pay | Admitting: Internal Medicine

## 2023-11-23 ENCOUNTER — Other Ambulatory Visit: Payer: Self-pay | Admitting: Internal Medicine

## 2023-11-26 DIAGNOSIS — L820 Inflamed seborrheic keratosis: Secondary | ICD-10-CM | POA: Diagnosis not present

## 2023-11-26 DIAGNOSIS — L918 Other hypertrophic disorders of the skin: Secondary | ICD-10-CM | POA: Diagnosis not present

## 2023-11-30 DIAGNOSIS — M546 Pain in thoracic spine: Secondary | ICD-10-CM | POA: Diagnosis not present

## 2023-11-30 DIAGNOSIS — M9902 Segmental and somatic dysfunction of thoracic region: Secondary | ICD-10-CM | POA: Diagnosis not present

## 2023-12-14 DIAGNOSIS — H43813 Vitreous degeneration, bilateral: Secondary | ICD-10-CM | POA: Diagnosis not present

## 2023-12-14 DIAGNOSIS — H43391 Other vitreous opacities, right eye: Secondary | ICD-10-CM | POA: Diagnosis not present

## 2023-12-14 DIAGNOSIS — H31093 Other chorioretinal scars, bilateral: Secondary | ICD-10-CM | POA: Diagnosis not present

## 2023-12-14 DIAGNOSIS — H26492 Other secondary cataract, left eye: Secondary | ICD-10-CM | POA: Diagnosis not present

## 2024-01-05 DIAGNOSIS — M9901 Segmental and somatic dysfunction of cervical region: Secondary | ICD-10-CM | POA: Diagnosis not present

## 2024-01-05 DIAGNOSIS — M9903 Segmental and somatic dysfunction of lumbar region: Secondary | ICD-10-CM | POA: Diagnosis not present

## 2024-01-05 DIAGNOSIS — M546 Pain in thoracic spine: Secondary | ICD-10-CM | POA: Diagnosis not present

## 2024-01-05 DIAGNOSIS — M9902 Segmental and somatic dysfunction of thoracic region: Secondary | ICD-10-CM | POA: Diagnosis not present

## 2024-01-26 ENCOUNTER — Telehealth: Admitting: Family Medicine

## 2024-01-26 DIAGNOSIS — M9902 Segmental and somatic dysfunction of thoracic region: Secondary | ICD-10-CM | POA: Diagnosis not present

## 2024-01-26 DIAGNOSIS — M546 Pain in thoracic spine: Secondary | ICD-10-CM | POA: Diagnosis not present

## 2024-01-26 DIAGNOSIS — M9903 Segmental and somatic dysfunction of lumbar region: Secondary | ICD-10-CM | POA: Diagnosis not present

## 2024-01-26 DIAGNOSIS — K625 Hemorrhage of anus and rectum: Secondary | ICD-10-CM

## 2024-01-26 DIAGNOSIS — M9901 Segmental and somatic dysfunction of cervical region: Secondary | ICD-10-CM | POA: Diagnosis not present

## 2024-01-26 NOTE — Patient Instructions (Signed)
Please follow-up in person

## 2024-01-26 NOTE — Progress Notes (Signed)
 Virtual Visit Consent   Sheila Fuller, you are scheduled for a virtual visit with a [facility] provider today. Just as with appointments in the office, your consent must be obtained to participate. Your consent will be active for this visit and any virtual visit you may have with one of our providers in the next 365 days. If you have a MyChart account, a copy of this consent can be sent to you electronically.  As this is a virtual visit, video technology does not allow for your provider to perform a traditional examination. This may limit your provider's ability to fully assess your condition. If your provider identifies any concerns that need to be evaluated in person or the need to arrange testing (such as labs, EKG, etc.), we will make arrangements to do so. Although advances in technology are sophisticated, we cannot ensure that it will always work on either your end or our end. If the connection with a video visit is poor, the visit may have to be switched to a telephone visit. With either a video or telephone visit, we are not always able to ensure that we have a secure connection.  By engaging in this virtual visit, you consent to the provision of healthcare and authorize for your insurance to be billed (if applicable) for the services provided during this visit. Depending on your insurance coverage, you may receive a charge related to this service.  I need to obtain your verbal consent now. Are you willing to proceed with your visit today? Sheila Fuller has provided verbal consent on 01/26/2024 for a virtual visit (video or telephone). Freddy Finner, NP  Date: 01/26/2024 1:33 PM   Virtual Visit via Video Note   I, Freddy Finner, connected with  Sheila Fuller  ([id number], Mar 01, 1956) on 01/26/24 at  1:45 PM EDT by a video-enabled telemedicine application and verified that I am speaking with the correct person using two identifiers.  Location: Patient: Virtual Visit Location Patient:  Home Provider: Virtual Visit Location Provider: Home Office   I discussed the limitations of evaluation and management by telemedicine and the availability of in person appointments. The patient expressed understanding and agreed to proceed.    History of Present Illness: Sheila Fuller is a 68 y.o. who identifies as a female who was assigned female at birth, and is being seen today for GI upset  Having trouble with her intestines feeling like it is swollen. Bloating with gas. Also having rectal bleeding, and is wearing a pad (history of this, reports it is not a lot) Using Miralax- daily at night. Is having good movements. Taking gas x pills  Can't get into see her GI until end of April   Problems:  Patient Active Problem List   Diagnosis Date Noted   S/P mastectomy, bilateral 10/22/2018   High risk of ovarian cancer 09/07/2017   BRCA1 positive 08/19/2017   Family history of BRCA gene mutation    Family history of breast cancer    Family history of pancreatic cancer    PCP NOTES >>>>>>>> 10/19/2015   Deformity of metatarsal bone of left foot 08/21/2015   Pronation deformity of left foot 08/21/2015   Essential hypertension 01/03/2015   HNP (herniated nucleus pulposus), cervical 08/23/2014   Lactose intolerance 10/04/2013   Lipoma of back 08/05/2013   Annual physical exam 11/28/2011   Tobacco abuse 06/17/2011   De Quervain's disease (tenosynovitis) 05/01/2011   DJD (degenerative joint disease) 04/25/2011   INSOMNIA-SLEEP  DISORDER-UNSPEC 08/20/2009   OSA (obstructive sleep apnea) 07/30/2008   NEOP, MALIGNANT, SKIN NOS 05/03/2007   COLONIC POLYPS 05/03/2007   Dyslipidemia 05/03/2007   Anxiety and depression 05/03/2007   Allergic rhinitis 05/03/2007   GERD 05/03/2007    Allergies:  Allergies  Allergen Reactions   Statins Other (See Comments)    REACTION: myalgia In 2010 pain she reported aches and pains with cholesterol atorvastatin. Tried Pravachol: Memory loss Tried   Zetia, question of diarrhea   Amlodipine Swelling and Other (See Comments)    Edema/swelling in legs.   Sulfonamide Derivatives Rash   Medications:  Current Outpatient Medications:    buPROPion (WELLBUTRIN XL) 150 MG 24 hr tablet, Take 1 tablet (150 mg total) by mouth daily., Disp: 90 tablet, Rfl: 2   albuterol (PROAIR HFA) 108 (90 Base) MCG/ACT inhaler, Inhale 2 puffs into the lungs every 4 (four) hours as needed for wheezing or shortness of breath., Disp: 54 g, Rfl: 3   chlorthalidone (HYGROTON) 25 MG tablet, Take 1 tablet (25 mg total) by mouth daily., Disp: 90 tablet, Rfl: 3   cyclobenzaprine (FLEXERIL) 5 MG tablet, Take 1 tablet (5 mg total) by mouth at bedtime as needed for muscle spasms., Disp: 90 tablet, Rfl: 1   D-MANNOSE PO, Take 1 capsule by mouth daily., Disp: , Rfl:    desloratadine (CLARINEX) 5 MG tablet, Take 1 tablet (5 mg total) by mouth daily as needed (for allergies.)., Disp: 90 tablet, Rfl: 2   ezetimibe (ZETIA) 10 MG tablet, Take 1 tablet (10 mg total) by mouth daily., Disp: 90 tablet, Rfl: 3   fluticasone (FLONASE) 50 MCG/ACT nasal spray, Place 2 sprays into both nostrils daily., Disp: 16 g, Rfl: 6   losartan (COZAAR) 100 MG tablet, Take 1 tablet (100 mg total) by mouth daily., Disp: 90 tablet, Rfl: 3   metoprolol tartrate (LOPRESSOR) 50 MG tablet, TAKE 1 TABLET DAILY, Disp: 90 tablet, Rfl: 3   naproxen (NAPROSYN) 500 MG tablet, Take 1 tablet (500 mg total) by mouth daily as needed for moderate pain., Disp: 90 tablet, Rfl: 3   psyllium (REGULOID) 0.52 g capsule, Take 0.52 g by mouth daily., Disp: , Rfl:    raNITIdine HCl (RANITIDINE 75 PO), Take 1 tablet by mouth as needed. (Patient not taking: Reported on 06/10/2023), Disp: , Rfl:    rosuvastatin (CRESTOR) 5 MG tablet, Take 1 tablet (5 mg total) by mouth 3 (three) times a week., Disp: 36 tablet, Rfl: 2  Observations/Objective: Patient is well-developed, well-nourished in no acute distress.  Resting comfortably  at home.   Head is normocephalic, atraumatic.  No labored breathing.  Speech is clear and coherent with logical content.  Patient is alert and oriented at baseline.    Assessment and Plan:  1. Rectal bleeding (Primary)  Given history and on going rectal bleeding and bloating, I have advise her to be seen in person as soon as possible. Declines need for ED. Will call her PCP  Patient acknowledged agreement and understanding of the plan.     Follow Up Instructions: I discussed the assessment and treatment plan with the patient. The patient was provided an opportunity to ask questions and all were answered. The patient agreed with the plan and demonstrated an understanding of the instructions.  A copy of instructions were sent to the patient via MyChart unless otherwise noted below.    The patient was advised to call back or seek an in-person evaluation if the symptoms worsen or if the condition  fails to improve as anticipated.    Freddy Finner, NP

## 2024-02-09 DIAGNOSIS — M9902 Segmental and somatic dysfunction of thoracic region: Secondary | ICD-10-CM | POA: Diagnosis not present

## 2024-02-09 DIAGNOSIS — M9903 Segmental and somatic dysfunction of lumbar region: Secondary | ICD-10-CM | POA: Diagnosis not present

## 2024-02-09 DIAGNOSIS — M9901 Segmental and somatic dysfunction of cervical region: Secondary | ICD-10-CM | POA: Diagnosis not present

## 2024-02-09 DIAGNOSIS — M546 Pain in thoracic spine: Secondary | ICD-10-CM | POA: Diagnosis not present

## 2024-02-18 DIAGNOSIS — H26492 Other secondary cataract, left eye: Secondary | ICD-10-CM | POA: Diagnosis not present

## 2024-02-18 DIAGNOSIS — Z961 Presence of intraocular lens: Secondary | ICD-10-CM | POA: Diagnosis not present

## 2024-02-18 DIAGNOSIS — H18413 Arcus senilis, bilateral: Secondary | ICD-10-CM | POA: Diagnosis not present

## 2024-02-18 DIAGNOSIS — H43813 Vitreous degeneration, bilateral: Secondary | ICD-10-CM | POA: Diagnosis not present

## 2024-02-23 ENCOUNTER — Ambulatory Visit: Admitting: Physician Assistant

## 2024-03-08 DIAGNOSIS — M9902 Segmental and somatic dysfunction of thoracic region: Secondary | ICD-10-CM | POA: Diagnosis not present

## 2024-03-08 DIAGNOSIS — M9901 Segmental and somatic dysfunction of cervical region: Secondary | ICD-10-CM | POA: Diagnosis not present

## 2024-03-08 DIAGNOSIS — M9903 Segmental and somatic dysfunction of lumbar region: Secondary | ICD-10-CM | POA: Diagnosis not present

## 2024-03-08 DIAGNOSIS — M546 Pain in thoracic spine: Secondary | ICD-10-CM | POA: Diagnosis not present

## 2024-03-29 DIAGNOSIS — M9902 Segmental and somatic dysfunction of thoracic region: Secondary | ICD-10-CM | POA: Diagnosis not present

## 2024-03-29 DIAGNOSIS — M9903 Segmental and somatic dysfunction of lumbar region: Secondary | ICD-10-CM | POA: Diagnosis not present

## 2024-03-29 DIAGNOSIS — M9901 Segmental and somatic dysfunction of cervical region: Secondary | ICD-10-CM | POA: Diagnosis not present

## 2024-03-29 DIAGNOSIS — M546 Pain in thoracic spine: Secondary | ICD-10-CM | POA: Diagnosis not present

## 2024-04-26 DIAGNOSIS — M9903 Segmental and somatic dysfunction of lumbar region: Secondary | ICD-10-CM | POA: Diagnosis not present

## 2024-04-26 DIAGNOSIS — M9902 Segmental and somatic dysfunction of thoracic region: Secondary | ICD-10-CM | POA: Diagnosis not present

## 2024-04-26 DIAGNOSIS — M9901 Segmental and somatic dysfunction of cervical region: Secondary | ICD-10-CM | POA: Diagnosis not present

## 2024-04-26 DIAGNOSIS — M546 Pain in thoracic spine: Secondary | ICD-10-CM | POA: Diagnosis not present

## 2024-05-10 DIAGNOSIS — M9901 Segmental and somatic dysfunction of cervical region: Secondary | ICD-10-CM | POA: Diagnosis not present

## 2024-05-10 DIAGNOSIS — M546 Pain in thoracic spine: Secondary | ICD-10-CM | POA: Diagnosis not present

## 2024-05-10 DIAGNOSIS — M9903 Segmental and somatic dysfunction of lumbar region: Secondary | ICD-10-CM | POA: Diagnosis not present

## 2024-05-10 DIAGNOSIS — M9902 Segmental and somatic dysfunction of thoracic region: Secondary | ICD-10-CM | POA: Diagnosis not present

## 2024-05-26 ENCOUNTER — Other Ambulatory Visit: Payer: Self-pay | Admitting: Internal Medicine

## 2024-05-31 DIAGNOSIS — M546 Pain in thoracic spine: Secondary | ICD-10-CM | POA: Diagnosis not present

## 2024-05-31 DIAGNOSIS — M9902 Segmental and somatic dysfunction of thoracic region: Secondary | ICD-10-CM | POA: Diagnosis not present

## 2024-05-31 DIAGNOSIS — M9901 Segmental and somatic dysfunction of cervical region: Secondary | ICD-10-CM | POA: Diagnosis not present

## 2024-05-31 DIAGNOSIS — M9903 Segmental and somatic dysfunction of lumbar region: Secondary | ICD-10-CM | POA: Diagnosis not present

## 2024-06-02 ENCOUNTER — Other Ambulatory Visit: Payer: Self-pay | Admitting: Internal Medicine

## 2024-06-13 ENCOUNTER — Other Ambulatory Visit: Payer: Self-pay | Admitting: Internal Medicine

## 2024-06-14 DIAGNOSIS — M9903 Segmental and somatic dysfunction of lumbar region: Secondary | ICD-10-CM | POA: Diagnosis not present

## 2024-06-14 DIAGNOSIS — M9901 Segmental and somatic dysfunction of cervical region: Secondary | ICD-10-CM | POA: Diagnosis not present

## 2024-06-14 DIAGNOSIS — M546 Pain in thoracic spine: Secondary | ICD-10-CM | POA: Diagnosis not present

## 2024-06-14 DIAGNOSIS — M9902 Segmental and somatic dysfunction of thoracic region: Secondary | ICD-10-CM | POA: Diagnosis not present

## 2024-06-15 ENCOUNTER — Ambulatory Visit (INDEPENDENT_AMBULATORY_CARE_PROVIDER_SITE_OTHER): Payer: BC Managed Care – PPO | Admitting: Internal Medicine

## 2024-06-15 ENCOUNTER — Encounter: Payer: Self-pay | Admitting: Internal Medicine

## 2024-06-15 VITALS — BP 128/86 | HR 67 | Temp 97.9°F | Resp 16 | Ht 67.5 in | Wt 180.1 lb

## 2024-06-15 DIAGNOSIS — F5101 Primary insomnia: Secondary | ICD-10-CM

## 2024-06-15 DIAGNOSIS — F338 Other recurrent depressive disorders: Secondary | ICD-10-CM

## 2024-06-15 DIAGNOSIS — R739 Hyperglycemia, unspecified: Secondary | ICD-10-CM | POA: Diagnosis not present

## 2024-06-15 DIAGNOSIS — Z0001 Encounter for general adult medical examination with abnormal findings: Secondary | ICD-10-CM | POA: Diagnosis not present

## 2024-06-15 DIAGNOSIS — E559 Vitamin D deficiency, unspecified: Secondary | ICD-10-CM | POA: Diagnosis not present

## 2024-06-15 DIAGNOSIS — E785 Hyperlipidemia, unspecified: Secondary | ICD-10-CM | POA: Diagnosis not present

## 2024-06-15 DIAGNOSIS — I10 Essential (primary) hypertension: Secondary | ICD-10-CM | POA: Diagnosis not present

## 2024-06-15 DIAGNOSIS — Z1501 Genetic susceptibility to malignant neoplasm of breast: Secondary | ICD-10-CM

## 2024-06-15 DIAGNOSIS — R5383 Other fatigue: Secondary | ICD-10-CM | POA: Diagnosis not present

## 2024-06-15 DIAGNOSIS — Z0000 Encounter for general adult medical examination without abnormal findings: Secondary | ICD-10-CM

## 2024-06-15 DIAGNOSIS — Z780 Asymptomatic menopausal state: Secondary | ICD-10-CM

## 2024-06-15 DIAGNOSIS — Z7185 Encounter for immunization safety counseling: Secondary | ICD-10-CM

## 2024-06-15 LAB — COMPREHENSIVE METABOLIC PANEL WITH GFR
ALT: 21 U/L (ref 0–35)
AST: 19 U/L (ref 0–37)
Albumin: 4.4 g/dL (ref 3.5–5.2)
Alkaline Phosphatase: 65 U/L (ref 39–117)
BUN: 12 mg/dL (ref 6–23)
CO2: 31 meq/L (ref 19–32)
Calcium: 9.1 mg/dL (ref 8.4–10.5)
Chloride: 103 meq/L (ref 96–112)
Creatinine, Ser: 0.83 mg/dL (ref 0.40–1.20)
GFR: 72.94 mL/min (ref >60.00)
Glucose, Bld: 90 mg/dL (ref 70–99)
Potassium: 4.4 meq/L (ref 3.5–5.1)
Sodium: 142 meq/L (ref 135–145)
Total Bilirubin: 0.5 mg/dL (ref 0.2–1.2)
Total Protein: 6.9 g/dL (ref 6.0–8.3)

## 2024-06-15 LAB — CBC WITH DIFFERENTIAL/PLATELET
Basophils Absolute: 0 K/uL (ref 0.0–0.1)
Basophils Relative: 0.5 % (ref 0.0–3.0)
Eosinophils Absolute: 0.1 K/uL (ref 0.0–0.7)
Eosinophils Relative: 1.8 % (ref 0.0–5.0)
HCT: 43.5 % (ref 36.0–46.0)
Hemoglobin: 14.6 g/dL (ref 12.0–15.0)
Lymphocytes Relative: 24.9 % (ref 12.0–46.0)
Lymphs Abs: 1.4 K/uL (ref 0.7–4.0)
MCHC: 33.5 g/dL (ref 30.0–36.0)
MCV: 90.5 fl (ref 78.0–100.0)
Monocytes Absolute: 0.4 K/uL (ref 0.1–1.0)
Monocytes Relative: 6.6 % (ref 3.0–12.0)
Neutro Abs: 3.7 K/uL (ref 1.4–7.7)
Neutrophils Relative %: 66.2 % (ref 43.0–77.0)
Platelets: 243 K/uL (ref 150.0–400.0)
RBC: 4.8 Mil/uL (ref 3.87–5.11)
RDW: 13.6 % (ref 11.5–15.5)
WBC: 5.5 K/uL (ref 4.0–10.5)

## 2024-06-15 LAB — LIPID PANEL
Cholesterol: 191 mg/dL (ref 0–200)
HDL: 56.2 mg/dL (ref >39.00)
LDL Cholesterol: 97 mg/dL (ref 0–99)
NonHDL: 134.33
Total CHOL/HDL Ratio: 3
Triglycerides: 186 mg/dL — ABNORMAL HIGH (ref 0.0–149.0)
VLDL: 37.2 mg/dL (ref 0.0–40.0)

## 2024-06-15 LAB — TSH: TSH: 2.45 u[IU]/mL (ref 0.35–5.50)

## 2024-06-15 LAB — B12 AND FOLATE PANEL
Folate: 16.9 ng/mL (ref >5.9)
Vitamin B-12: 264 pg/mL (ref 211–911)

## 2024-06-15 LAB — HEMOGLOBIN A1C: Hgb A1c MFr Bld: 6.1 % (ref 4.6–6.5)

## 2024-06-15 LAB — VITAMIN D 25 HYDROXY (VIT D DEFICIENCY, FRACTURES): VITD: 30.35 ng/mL (ref 30.00–100.00)

## 2024-06-15 NOTE — Patient Instructions (Addendum)
 Vaccines I recommend this fall: COVID-vaccine Flu shot  Continue checking your blood pressure regularly Blood pressure goal:  between 110/65 and  135/85. If it is consistently higher or lower, let me know  For sleep: Please see the tips provided below. Start melatonin between 4 to 8 mg every night.  GO TO THE LAB :  Get the blood work   Your results will be posted on MyChart with my comments  Go to the front desk for the checkout Please make an appointment for a follow-up in 4 months.    STOP BY THE FIRST FLOOR: Schedule bone density test  HEALTHY SLEEP Sleep hygiene: Basic rules for a good night's sleep  Sleep only as much as you need to feel rested and then get out of bed  Keep a regular sleep schedule  Avoid forcing sleep  Exercise regularly for at least 20 minutes, preferably 4 to 5 hours before bedtime  Avoid caffeinated beverages after lunch  Avoid alcohol near bedtime: no night cap  Avoid smoking, especially in the evening  Do not go to bed hungry  Adjust bedroom environment  Avoid prolonged use of light-emitting screens before bedtime   Deal with your worries before bedtime

## 2024-06-15 NOTE — Progress Notes (Unsigned)
 Subjective:    Patient ID: Sheila Fuller, female    DOB: 1955/11/22, 68 y.o.   MRN: [id number]  DOS:  06/15/2024 Type of visit - description: CPX  Here for CPX Chronic medical problems addressed. Multiple other issues addressed as well.  Mild fatigue, just lack of energy, denies chest pain, DOE or cough.  Request vitamin checks.  Continue with occasionally red blood drops in the toilet paper after BM.  Stools are otherwise normal.  No anal or rectal pain.  Self stopped chlorthalidone .  BPs remain controlled.  Wakes up after 4 to 5 hours and is unable to go back to sleep.   Review of Systems See above   Past Medical History:  Diagnosis Date   Allergic rhinitis    Allergy    Anxiety    Arthritis    Asthma    seasonally   BRCA1 positive    Cancer (HCC)    skin   Carcinoma in situ of perianal skin    +XRT & chemo 2000; no further f/u w/ specialist.    Cataract    will remove left end of 10-2016   Class 1 obesity    Depression    Family history of BRCA gene mutation    daughter BRCA1 +   Family history of breast cancer    Family history of pancreatic cancer    GERD (gastroesophageal reflux disease)    takes zantac maybe 2/week   Hyperlipidemia    Hypertension    Lactose intolerance 10/04/2013   Microhematuria 01/2006   saw Dr.Peterson, renal u/s: r pyelocaliectasis but CT was neg, cysto neg   Shortness of breath    only with asthma flare up   Sleep apnea    rarely uses cpap    Past Surgical History:  Procedure Laterality Date   ANTERIOR CERVICAL DECOMP/DISCECTOMY FUSION N/A 08/23/2014   Procedure: C5-6 Anterior Cervical Discectomy and Fusion, Allograft, Plate;  Surgeon: Oneil JAYSON Herald, MD;  Location: [facility] OR;  Service: Orthopedics;  Laterality: N/A;   BREAST SURGERY  09/2018   BRCA-1 gene positive, +reconstruction   cervical cauterization     CESAREAN SECTION     GANGLION CYST EXCISION     MASTECTOMY Bilateral ~ 1/22020   NASAL SEPTUM SURGERY     POLYPECTOMY      Revision  breast reconstruction surgery 03/01/2019  02/2021   ROBOTIC ASSISTED BILATERAL SALPINGO OOPHERECTOMY Bilateral 11/24/2017   Procedure: XI ROBOTIC ASSISTED BILATERAL SALPINGO OOPHORECTOMY;  Surgeon: Eloy Herring, MD;  Location: [facility];  Service: Gynecology;  Laterality: Bilateral;    Current Outpatient Medications  Medication Instructions   albuterol  (PROAIR  HFA) 108 (90 Base) MCG/ACT inhaler 2 puffs, Inhalation, Every 4 hours PRN   buPROPion  (WELLBUTRIN  XL) 150 mg, Oral, Daily   cyclobenzaprine  (FLEXERIL ) 5 mg, Oral, At bedtime PRN   D-MANNOSE PO 1 capsule, Daily   desloratadine  (CLARINEX ) 5 mg, Oral, Daily PRN   ezetimibe  (ZETIA ) 10 mg, Oral, Daily   fluticasone  (FLONASE ) 50 MCG/ACT nasal spray 2 sprays, Each Nare, Daily   losartan  (COZAAR ) 100 mg, Oral, Daily   metoprolol  tartrate (LOPRESSOR ) 50 mg, Oral, Daily   naproxen  (NAPROSYN ) 500 mg, Oral, Daily PRN   psyllium (REGULOID) 0.52 g, Daily   rosuvastatin  (CRESTOR ) 5 mg, Oral, 3 times weekly       Objective:   Physical Exam BP 128/86   Pulse 67   Temp 97.9 F (36.6 C) (Oral)   Resp 16   Ht 5' 7.5 (  1.715 m)   Wt 180 lb 2 oz (81.7 kg)   SpO2 97%   BMI 27.80 kg/m  General: Well developed, NAD, BMI noted Neck: No  thyromegaly  HEENT:  Normocephalic . Face symmetric, atraumatic Lungs:  CTA B Normal respiratory effort, no intercostal retractions, no accessory muscle use. Heart: RRR,  no murmur.  Abdomen:  Not distended, soft, non-tender. No rebound or rigidity.   Lower extremities: no pretibial edema bilaterally  Skin: Exposed areas without rash. Not pale. Not jaundice Neurologic:  alert & oriented X3.  Speech normal, gait appropriate for age and unassisted Strength symmetric and appropriate for age.  Psych: Cognition and judgment appear intact.  Cooperative with normal attention span and concentration.  Behavior appropriate. No anxious or depressed appearing.     Assessment   ,  Assessment Hyperglycemia HTN Hyperlipidemia (statin intolerant) Anxiety, depression GERD Seasonal asthma-Allergies Obesity : BMI~ 28 plus HTN, hyperlipidemia, OSA OSA: on Cpap consistently as off 04-2021 MSK: DJD, occasionally uses pain medication H/o Microhematuria 2007, Dr. Andra, renal US  showed right pyelocaliectasis, CT was negative, cystoscopy negative + BRCA: personal h/o (dx 2018)  and FH BRCA (daughter, sister).  See office visit 10/14/2017 Bilateral mastectomy 09/2018 H/o Carcinoma in situ, perianal skin, XRT, chemotherapy 2000. Not further follow-ups with specialist HOH: has hearing aids B Vitamins deficiency  PLAN:  Here for CPX -Td 10/2018 - Pneumonia shot 2014; prevnar 2016; PNM 20: 2023 -  Shingrix completed 2020 - recommend:  flu shot  (fall), covid booster  -CCS: Cscopes 2003 , 2007 (Tubullovillous polyps) , 2012,  09-2016 and 07/2021 , next October 2029 per GI note. - female care per gyn:  *s/p oophorectomy 2018   *PAP 2021, was rec no further PAPs *Breast ca screening: s/p B  Mastectomy, d/t BRCA , no further MMG - Dexa 12-2006 , 8/5/020: Normal; h/o early menopause.  Next DEXA d/w pt  we agreed to proceed 2025  - former smoker: Never heavy smoker, again not interested on CT chest for lung cancer screening.  Reportedly (-)  genetic testing for lung cancer -Labs: CMP FLP CBC A1c vitamin D  B12 folic acid TSH dexa hyperglycemia  -Diet and exercise    -Healthcare POA: On file  Hyperglycemia: Check A1c HTN: Stop chlorthalidone , felt it dehydrated her.  Ambulatory BPs remain normal, on losartan , metoprolol . Hyperlipidemia: On Crestor  3 times a week and Zetia .  Checking labs. Anxiety depression: Controlled on Wellbutrin . OSA: Good CPAP compliance nightly. Fatigue, mild fatigue without cardiopulmonary symptoms, request dynamic checks.  Will do. Insomnia: Wakes up 4 to 5 hours after she goes to sleep.  Recommend melatonin 4 to 8  mg daily.  tips for healthy sleep  provided. Vitamin D  deficiency: On supplements.  Labs.  Red blood per rectum: As described above, likely a distal benign etiology, recommend to reconsult GI if symptoms severe or different.  RTC 4 months  Level 4 ======= Anal discomfort erythema: See last office visit here, subsequently saw GI , small-volume and rectal bleeding likely due to dermatitis versus hemorrhoids.  Dermatitis resolved. Hyperglycemia: Has healthy lifestyle, check A1c HTN: On chlorthalidone , losartan , metoprolol .  Check CBC and CMP. Hyperlipidemia: Statin intolerant, on Zetia , check FLP. Vitamin D  deficiency: Checking levels, encourage supplements. Anxiety depression: On Wellbutrin , controlled. OSA: Reports good CPAP compliance. Seasonal allergies: Request a refill on Clarinex .  Sent RTC 1 year.

## 2024-06-16 ENCOUNTER — Ambulatory Visit: Payer: Self-pay | Admitting: Internal Medicine

## 2024-06-16 ENCOUNTER — Encounter: Payer: Self-pay | Admitting: Internal Medicine

## 2024-06-16 NOTE — Assessment & Plan Note (Signed)
 Here for CPX   Other issues addressed Hyperglycemia: Check A1c HTN: Self d/c chlorthalidone , felt it dehydrated her.  Ambulatory BPs remain normal, on losartan , metoprolol .  Check labs Hyperlipidemia: On Crestor  3 times a week and Zetia .  Checking labs. Anxiety depression: Controlled on Wellbutrin . OSA: Good CPAP compliance nightly. Fatigue, mild fatigue without cardiopulmonary symptoms, requests multiple labs.  Will do Insomnia: Wakes up 4 to 5 hours after she goes to sleep.  Recommend melatonin 4 to 8  mg daily.  tips for healthy sleep provided. Vitamin D  deficiency: On supplements.  Labs. Red blood per rectum: Not a new issue, described above, likely a distal benign etiology, recommend to reconsult GI if symptoms severe or different. RTC 4 months

## 2024-06-16 NOTE — Assessment & Plan Note (Signed)
 Here for CPX -Td 10/2018 - Pneumonia shot 2014; prevnar 2016; PNM 20: 2023 -  Shingrix completed 2020 - recommend:  flu shot  (fall), covid booster  -CCS: Cscopes 2003 , 2007 (Tubullovillous polyps) , 2012,  09-2016 and 07/2021 , next October 2029 per GI note. - female care per gyn:  *s/p oophorectomy 2018   *PAP 2021, was rec no further PAPs *Breast ca screening: s/p B  Mastectomy, d/t BRCA , no further MMG - Dexa 12-2006 , 8/5/020: Normal; h/o early menopause.   DEXA ordered - former smoker: Never heavy smoker,  not interested on LDCT .  Reportedly (-)  genetic testing for lung cancer -Labs: CMP FLP CBC A1c vitamin D  B12 folic acid TSH d - Age healthy lifestyle is encouraged  -Healthcare POA: On file

## 2024-06-21 ENCOUNTER — Ambulatory Visit (HOSPITAL_BASED_OUTPATIENT_CLINIC_OR_DEPARTMENT_OTHER)
Admission: RE | Admit: 2024-06-21 | Discharge: 2024-06-21 | Disposition: A | Discharge status: Home / Self Care | Source: Ambulatory Visit | Attending: Internal Medicine | Admitting: Internal Medicine

## 2024-06-21 DIAGNOSIS — Z780 Asymptomatic menopausal state: Secondary | ICD-10-CM | POA: Diagnosis not present

## 2024-06-30 DIAGNOSIS — M546 Pain in thoracic spine: Secondary | ICD-10-CM | POA: Diagnosis not present

## 2024-06-30 DIAGNOSIS — M9902 Segmental and somatic dysfunction of thoracic region: Secondary | ICD-10-CM | POA: Diagnosis not present

## 2024-06-30 DIAGNOSIS — M9903 Segmental and somatic dysfunction of lumbar region: Secondary | ICD-10-CM | POA: Diagnosis not present

## 2024-06-30 DIAGNOSIS — M9901 Segmental and somatic dysfunction of cervical region: Secondary | ICD-10-CM | POA: Diagnosis not present

## 2024-07-04 ENCOUNTER — Other Ambulatory Visit: Payer: Self-pay | Admitting: Internal Medicine

## 2024-07-19 DIAGNOSIS — M9902 Segmental and somatic dysfunction of thoracic region: Secondary | ICD-10-CM | POA: Diagnosis not present

## 2024-07-19 DIAGNOSIS — M546 Pain in thoracic spine: Secondary | ICD-10-CM | POA: Diagnosis not present

## 2024-07-19 DIAGNOSIS — M9903 Segmental and somatic dysfunction of lumbar region: Secondary | ICD-10-CM | POA: Diagnosis not present

## 2024-07-19 DIAGNOSIS — M9901 Segmental and somatic dysfunction of cervical region: Secondary | ICD-10-CM | POA: Diagnosis not present

## 2024-08-01 ENCOUNTER — Other Ambulatory Visit: Payer: Self-pay | Admitting: Internal Medicine

## 2024-09-28 ENCOUNTER — Encounter

## 2024-10-18 ENCOUNTER — Ambulatory Visit: Admitting: Internal Medicine

## 2024-10-18 ENCOUNTER — Ambulatory Visit: Payer: Self-pay | Admitting: Internal Medicine

## 2024-10-18 VITALS — BP 130/84 | HR 79 | Temp 97.6°F | Resp 18 | Ht 67.5 in | Wt 178.2 lb

## 2024-10-18 DIAGNOSIS — G4733 Obstructive sleep apnea (adult) (pediatric): Secondary | ICD-10-CM

## 2024-10-18 DIAGNOSIS — E538 Deficiency of other specified B group vitamins: Secondary | ICD-10-CM | POA: Insufficient documentation

## 2024-10-18 DIAGNOSIS — R5383 Other fatigue: Secondary | ICD-10-CM

## 2024-10-18 DIAGNOSIS — I10 Essential (primary) hypertension: Secondary | ICD-10-CM | POA: Diagnosis not present

## 2024-10-18 DIAGNOSIS — J329 Chronic sinusitis, unspecified: Secondary | ICD-10-CM

## 2024-10-18 DIAGNOSIS — M25512 Pain in left shoulder: Secondary | ICD-10-CM | POA: Diagnosis not present

## 2024-10-18 LAB — BASIC METABOLIC PANEL WITH GFR
BUN: 16 mg/dL (ref 6–23)
CO2: 30 meq/L (ref 19–32)
Calcium: 9 mg/dL (ref 8.4–10.5)
Chloride: 104 meq/L (ref 96–112)
Creatinine, Ser: 0.82 mg/dL (ref 0.40–1.20)
GFR: 73.83 mL/min
Glucose, Bld: 96 mg/dL (ref 70–99)
Potassium: 3.9 meq/L (ref 3.5–5.1)
Sodium: 141 meq/L (ref 135–145)

## 2024-10-18 MED ORDER — CYANOCOBALAMIN 1000 MCG/ML IJ SOLN
1000.0000 ug | Freq: Once | INTRAMUSCULAR | Status: AC
  Administered 2024-10-18: 1000 ug via INTRAMUSCULAR

## 2024-10-18 NOTE — Progress Notes (Signed)
 "  Subjective:    Patient ID: Sheila Fuller, female    DOB: [birth year], 68 y.o.   MRN: [id number]  DOS:  10/18/2024  Follow up  Discussed the use of AI scribe software for clinical note transcription with the patient, who gave verbal consent to proceed.  History of Present Illness Sheila Fuller is a 68 year old female who presents for a routine checkup.  Left shoulder pain - Pain localized to the left shoulder with occasional radiation to the back - Cold weather exacerbates the pain - Able to lift arm without significant difficulty - History of prior neck surgery, but current pain is isolated to the shoulder and does not involve the neck.  No upper extremity paresthesias  Fatigue   - Fatigue present for several years, making daily activities burdensome - Low B12 levels and not taking B12 supplementation - Memory difficulties, partially attributed to aging  Respiratory symptoms - Intermittent wheezing controlled with albuterol  - Asthma medication used mainly during extreme weather - No chest pain, shortness of breath, or leg swelling  Hypertension - Blood pressure controlled on losartan  and metoprolol   Head congestion - Head congestion occurs when it rains, attributed to barometric pressure changes     Review of Systems See above   Past Medical History:  Diagnosis Date   Allergic rhinitis    Allergy    Anxiety    Arthritis    Asthma    seasonally   BRCA1 positive    Cancer (HCC)    skin   Carcinoma in situ of perianal skin    +XRT & chemo 2000; no further f/u w/ specialist.    Cataract    will remove left end of 10-2016   Class 1 obesity    Depression    Family history of BRCA gene mutation    daughter BRCA1 +   Family history of breast cancer    Family history of pancreatic cancer    GERD (gastroesophageal reflux disease)    takes zantac maybe 2/week   Hyperlipidemia    Hypertension    Lactose intolerance 10/04/2013   Microhematuria 01/2006   saw  Dr.Peterson, renal u/s: r pyelocaliectasis but CT was neg, cysto neg   Shortness of breath    only with asthma flare up   Sleep apnea    rarely uses cpap    Past Surgical History:  Procedure Laterality Date   ANTERIOR CERVICAL DECOMP/DISCECTOMY FUSION N/A 08/23/2014   Procedure: C5-6 Anterior Cervical Discectomy and Fusion, Allograft, Plate;  Surgeon: Oneil JAYSON Herald, MD;  Location: [facility];  Service: Orthopedics;  Laterality: N/A;   BREAST SURGERY  09/2018   BRCA-1 gene positive, +reconstruction   cervical cauterization     CESAREAN SECTION     GANGLION CYST EXCISION     MASTECTOMY Bilateral ~ 1/22020   NASAL SEPTUM SURGERY     POLYPECTOMY     Revision  breast reconstruction surgery 03/01/2019  02/2021   ROBOTIC ASSISTED BILATERAL SALPINGO OOPHERECTOMY Bilateral 11/24/2017   Procedure: XI ROBOTIC ASSISTED BILATERAL SALPINGO OOPHORECTOMY;  Surgeon: Eloy Herring, MD;  Location: [facility];  Service: Gynecology;  Laterality: Bilateral;    Current Outpatient Medications  Medication Instructions   albuterol  (PROAIR  HFA) 108 (90 Base) MCG/ACT inhaler 2 puffs, Inhalation, Every 4 hours PRN   buPROPion  (WELLBUTRIN  XL) 150 mg, Oral, Daily   co-enzyme Q-10 30 mg, 3 times daily   cyclobenzaprine  (FLEXERIL ) 5 mg, Oral, At bedtime PRN   D-MANNOSE PO 1  capsule, Daily   desloratadine  (CLARINEX ) 5 mg, Oral, Daily PRN   ezetimibe  (ZETIA ) 10 mg, Oral, Daily   fluticasone  (FLONASE ) 50 MCG/ACT nasal spray 2 sprays, Each Nare, Daily   losartan  (COZAAR ) 100 mg, Oral, Daily   MAGNESIUM GLYCINATE PO Take by mouth.   metoprolol  tartrate (LOPRESSOR ) 50 mg, Oral, Daily   naproxen  (NAPROSYN ) 500 mg, Oral, Daily PRN   psyllium (REGULOID) 0.52 g, Daily   rosuvastatin  (CRESTOR ) 5 mg, Oral, 3 times weekly       Objective:   Physical Exam BP 130/84   Pulse 79   Temp 97.6 F (36.4 C) (Oral)   Resp 18   Ht 5' 7.5 (1.715 m)   Wt 178 lb 4 oz (80.9 kg)   SpO2 96%   BMI 27.51 kg/m  General:   Well developed,  NAD, BMI noted. HEENT:  Normocephalic . Face symmetric, atraumatic Lungs:  CTA B Normal respiratory effort, no intercostal retractions, no accessory muscle use. Heart: RRR,  no murmur. Shoulders: Symmetric, no deformities, range of motion normal but pain is elicited with left arm elevation Skin: Not pale. Not jaundice Neurologic:  alert & oriented X3.  Speech normal, gait appropriate for age and unassisted Psych--  Cognition and judgment appear intact.  Cooperative with normal attention span and concentration.  Behavior appropriate. No anxious or depressed appearing.      Assessment   Assessment Hyperglycemia HTN Hyperlipidemia (Crestor  3 times a week) Anxiety, depression GERD Seasonal asthma-Allergies Obesity : BMI~ 28 plus HTN, hyperlipidemia, OSA OSA: on Cpap consistently as off 04-2021 MSK: DJD, occasionally uses pain medication H/o Microhematuria 2007, Dr. Andra, renal US  showed right pyelocaliectasis, CT was negative, cystoscopy negative + BRCA: personal h/o (dx 2018)  and FH BRCA (daughter, sister).  See office visit 10/14/2017 Bilateral mastectomy 09/2018 H/o Carcinoma in situ, perianal skin, XRT, chemotherapy 2000. Not further follow-ups with specialist HOH: has hearing aids B Vitamins deficiency  Assessment & Plan Fatigue Suspect is multifactorial including low normal B12 level and OSA.  No cardiopulmonary symptoms. -   B12 injection today and weekly for four weeks, then monthly. - Reassess in few months B12 deficiency: As above Vitamin D : On OTC's, last level satisfactory Left shoulder pain, possible rotator cuff injury Pain exacerbated by movement, possibly due to rotator cuff injury.  Sports medicine referral. HTN: Reports normal ambulatory BPs. - Continue losartan  and metoprolol . - Check BMP and continue monitoring Obstructive sleep apnea Recently started using a new CPAP machine with adjustable pressure. Asthma Occasional wheezing, well-controlled  with albuterol .  General health maintenance: Up-to-date on flu and COVID vaccines RTC 4 months     "

## 2024-10-18 NOTE — Assessment & Plan Note (Signed)
 Fatigue Suspect is multifactorial including low normal B12 level and OSA.  No cardiopulmonary symptoms. -   B12 injection today and weekly for four weeks, then monthly. - Reassess in few months B12 deficiency: As above Vitamin D : On OTC's, last level satisfactory Left shoulder pain, possible rotator cuff injury Pain exacerbated by movement, possibly due to rotator cuff injury.  Sports medicine referral. HTN: Reports normal ambulatory BPs. - Continue losartan  and metoprolol . - Check BMP and continue monitoring Obstructive sleep apnea Recently started using a new CPAP machine with adjustable pressure. Asthma Occasional wheezing, well-controlled with albuterol .  General health maintenance: Up-to-date on flu and COVID vaccines RTC 4 months

## 2024-10-18 NOTE — Patient Instructions (Signed)
 GO TO THE LAB :  Get the blood work    Then, go to the front desk for the checkout Please make an appointment for the following - A B12 shot with my nurse once a week for the next 3 weeks - Then B12 12 mg nurse once a month -A office visit with me in 4 months   We are referring you to a sports medicine doctor  Check the  blood pressure regularly Blood pressure goal:  between 110/65 and  135/85. If it is consistently higher or lower, let me know    : FATIGUE DUE TO B12 DEFICIENCY AND OBSTRUCTIVE SLEEP APNEA: Your chronic fatigue is likely due to low B12 levels and your sleep apnea. -You received a B12 injection today. You will get another injection weekly for four weeks, then monthly. -Monitor your fatigue levels over the next few months to see if there is any improvement.  LEFT SHOULDER PAIN, POSSIBLE ROTATOR CUFF INJURY: Your shoulder pain may be due to a rotator cuff injury. - We are referring you to a sports medicine doctor  ESSENTIAL HYPERTENSION: Your blood pressure is well-controlled with your current medications. -Continue taking losartan  and metoprolol  as prescribed. -Monitor your blood pressure at home regularly.  OBSTRUCTIVE SLEEP APNEA: You have started using a new CPAP machine,monitor for any improvements in your sleep quality.  ASTHMA: Your asthma is well-controlled with albuterol . -Continue using albuterol  as needed for wheezing or asthma symptoms.  GENERAL HEALTH MAINTENANCE: You are up to date with your flu and COVID vaccines. -No additional vaccines are needed at this time.

## 2024-10-26 ENCOUNTER — Ambulatory Visit (INDEPENDENT_AMBULATORY_CARE_PROVIDER_SITE_OTHER)

## 2024-10-26 DIAGNOSIS — E538 Deficiency of other specified B group vitamins: Secondary | ICD-10-CM | POA: Diagnosis not present

## 2024-10-26 MED ORDER — CYANOCOBALAMIN 1000 MCG/ML IJ SOLN
1000.0000 ug | Freq: Once | INTRAMUSCULAR | Status: AC
  Administered 2024-10-26: 1000 ug via INTRAMUSCULAR

## 2024-10-26 NOTE — Progress Notes (Signed)
 Patient is here for Weekly B12 injection per original order dated:   10/18/2024 B12 injection today and weekly for four weeks, then monthly. - Reassess in few months Per Dr. Amon  Last B12 injection: 10/18/2024  Last B12 level:  06/15/2024  B12 given Left deltoid IM, and patient tolerated injection well.  Next B12 injection scheduled for:  Number 3: January 7th, 2026 at 3:45pm Number 4: January 14th, 2026 @3 :45pm

## 2024-10-26 NOTE — Addendum Note (Signed)
 Addended by: VALLI TILLMAN BROCKS on: 10/26/2024 11:11 AM   Modules accepted: Level of Service

## 2024-11-01 NOTE — Telephone Encounter (Signed)
"  Referral placed   "

## 2024-11-02 ENCOUNTER — Ambulatory Visit (INDEPENDENT_AMBULATORY_CARE_PROVIDER_SITE_OTHER): Admitting: Neurology

## 2024-11-02 DIAGNOSIS — E538 Deficiency of other specified B group vitamins: Secondary | ICD-10-CM

## 2024-11-02 MED ORDER — CYANOCOBALAMIN 1000 MCG/ML IJ SOLN
1000.0000 ug | Freq: Once | INTRAMUSCULAR | Status: AC
  Administered 2024-11-02: 1000 ug via INTRAMUSCULAR

## 2024-11-02 NOTE — Progress Notes (Signed)
 "               Sheila Fuller D.[facility] [address] [place] [address] Phone: [phone]   Assessment and Plan:     1. Chronic left shoulder pain (Primary) 2. Rotator cuff strain, left, initial encounter 3. Impingement syndrome of left shoulder -Chronic with exacerbation, initial sports medicine visit - Consistent with impingement syndrome, rotator cuff tendinopathy, primarily supraspinatus, x 1 year - X-rays obtained in clinic.  My interpretation: No acute fracture or dislocation.  Mild glenohumeral spurring, moderate AC joint degenerative changes - Patient elected for subacromial CSI.  Tolerated well per note below - Patient already using naproxen  500 mg daily, so I do not believe alternative NSAID course would provide significantly improved relief.  May continue to use naproxen  5 mg daily as needed - Use Tylenol  500 to 1000 mg tablets 2-3 times a day for day-to-day pain relief - Start HEP for rotator cuff  Procedure: Subacromial Injection Side: Left  Risks explained and consent was given verbally. The site was cleaned with alcohol prep. A steroid injection was performed from posterior approach using 2mL of 1% lidocaine  without epinephrine  and 1mL of kenalog 40mg /ml. This was well tolerated.  Needle was removed, hemostasis achieved, and post injection instructions were explained.   Pt was advised to call or return to clinic if these symptoms worsen or fail to improve as anticipated.    15 additional minutes spent for educating Therapeutic Home Exercise Program.  This included exercises focusing on stretching, strengthening, with focus on eccentric aspects.   Long term goals include an improvement in range of motion, strength, endurance as well as avoiding reinjury. Patient's frequency would include in 1-2 times a day, 3-5 times a week for a duration of 6-12 weeks. Proper technique shown and discussed handout in great detail with ATC.  All questions were  discussed and answered.    Pertinent previous records reviewed include PCP note 10/18/24   Follow Up: 6 weeks for reevaluation.  Could consider physical therapy versus advanced imaging versus prednisone course.  Patient declined physical therapy at today's visit, however she can call if she changes her mind for PT referral to [place]   Subjective:   I, Chestine Reeves, am serving as a neurosurgeon for Doctor Morene Fuller  Chief Complaint: left shoulder pain   HPI:   11/03/2024 Patient is a 69 year old female with left shoulder pain. Patient states pain started a year ago. She thinks she was carrying a heavy laptop bag. She also thinks she might have over extended when taking a coat off. Tylenol  and ibu help some. Decreased ROM due to pain . No radiating pain. No numbness or tingling. Hx of cervical surgeries.    Relevant Historical Information: Hypertension  Additional pertinent review of systems negative.  Current Medications[1]   Objective:     Vitals:   11/03/24 1423  Pulse: 65  SpO2: 97%  Weight: 172 lb (78 kg)  Height: 5' 7 (1.702 m)      Body mass index is 26.94 kg/m.    Physical Exam:    Gen: Appears well, nad, nontoxic and pleasant Neuro:sensation intact, strength is 5/5, muscle tone wnl Skin: no suspicious lesion or defmority Psych: A&O, appropriate mood and affect  Left shoulder:  No deformity, swelling or muscle wasting No scapular winging FF 180, abd 180 with pain at endrange, int 0, ext 90 NTTP over the Thornton, clavicle, ac, coracoid, biceps groove, humerus,  deltoid, trapezius, cervical spine Follow Neer, Hawkins, empty can Neg   obriens, crossarm, subscap liftoff, speeds Neg ant drawer, sulcus sign, apprehension Negative Spurling's test bilat FROM of neck    Electronically signed by:  Sheila Fuller D.[facility] 2:47 PM 11/03/2024     [1]  Current Outpatient Medications:    albuterol  (PROAIR  HFA) 108 (90 Base) MCG/ACT  inhaler, Inhale 2 puffs into the lungs every 4 (four) hours as needed for wheezing or shortness of breath., Disp: 54 g, Rfl: 3   buPROPion  (WELLBUTRIN  XL) 150 MG 24 hr tablet, Take 1 tablet (150 mg total) by mouth daily., Disp: 90 tablet, Rfl: 1   co-enzyme Q-10 30 MG capsule, Take 30 mg by mouth 3 (three) times daily., Disp: , Rfl:    cyclobenzaprine  (FLEXERIL ) 5 MG tablet, Take 1 tablet (5 mg total) by mouth at bedtime as needed for muscle spasms., Disp: 90 tablet, Rfl: 1   D-MANNOSE PO, Take 1 capsule by mouth daily., Disp: , Rfl:    desloratadine  (CLARINEX ) 5 MG tablet, Take 1 tablet (5 mg total) by mouth daily as needed (for allergies.)., Disp: 90 tablet, Rfl: 2   ezetimibe  (ZETIA ) 10 MG tablet, Take 1 tablet (10 mg total) by mouth daily., Disp: 90 tablet, Rfl: 1   fluticasone  (FLONASE ) 50 MCG/ACT nasal spray, Place 2 sprays into both nostrils daily., Disp: 16 g, Rfl: 6   losartan  (COZAAR ) 100 MG tablet, Take 1 tablet (100 mg total) by mouth daily., Disp: 90 tablet, Rfl: 1   MAGNESIUM GLYCINATE PO, Take by mouth., Disp: , Rfl:    metoprolol  tartrate (LOPRESSOR ) 50 MG tablet, Take 1 tablet (50 mg total) by mouth daily., Disp: 90 tablet, Rfl: 1   naproxen  (NAPROSYN ) 500 MG tablet, Take 1 tablet (500 mg total) by mouth daily as needed for moderate pain (pain score 4-6)., Disp: 90 tablet, Rfl: 3   psyllium (REGULOID) 0.52 g capsule, Take 0.52 g by mouth daily., Disp: , Rfl:    rosuvastatin  (CRESTOR ) 5 MG tablet, Take 1 tablet (5 mg total) by mouth 3 (three) times a week., Disp: 36 tablet, Rfl: 1  "

## 2024-11-02 NOTE — Progress Notes (Signed)
 Patient is here for Weekly B12 injection per original order dated:   10/18/2024 B12 injection today and weekly for four weeks, then monthly. - Reassess in few months Per Dr. Amon   Last B12 injection: 10/26/2024   Last B12 level:   Lab Results  Component Value Date   VITAMINB12 264 06/15/2024      B12 given right deltoid IM, and patient tolerated injection well.   Next B12 injection scheduled for:   Number 4: January 14th, 2026 @3 :45pm

## 2024-11-03 ENCOUNTER — Ambulatory Visit

## 2024-11-03 ENCOUNTER — Ambulatory Visit (INDEPENDENT_AMBULATORY_CARE_PROVIDER_SITE_OTHER): Admitting: Sports Medicine

## 2024-11-03 VITALS — HR 65 | Ht 67.0 in | Wt 172.0 lb

## 2024-11-03 DIAGNOSIS — M25512 Pain in left shoulder: Secondary | ICD-10-CM

## 2024-11-03 DIAGNOSIS — M7542 Impingement syndrome of left shoulder: Secondary | ICD-10-CM | POA: Diagnosis not present

## 2024-11-03 DIAGNOSIS — S46012A Strain of muscle(s) and tendon(s) of the rotator cuff of left shoulder, initial encounter: Secondary | ICD-10-CM | POA: Diagnosis not present

## 2024-11-03 DIAGNOSIS — G8929 Other chronic pain: Secondary | ICD-10-CM

## 2024-11-03 NOTE — Patient Instructions (Signed)
 Shoulder HEP   Call if you would like a PT referral   6 week follow up

## 2024-11-09 ENCOUNTER — Encounter: Payer: Self-pay | Admitting: Internal Medicine

## 2024-11-09 ENCOUNTER — Ambulatory Visit

## 2024-11-09 ENCOUNTER — Ambulatory Visit: Admitting: Internal Medicine

## 2024-11-09 VITALS — BP 108/70 | HR 72 | Temp 98.1°F | Resp 16 | Ht 67.0 in | Wt 171.5 lb

## 2024-11-09 DIAGNOSIS — R8290 Unspecified abnormal findings in urine: Secondary | ICD-10-CM | POA: Diagnosis not present

## 2024-11-09 DIAGNOSIS — E538 Deficiency of other specified B group vitamins: Secondary | ICD-10-CM | POA: Diagnosis not present

## 2024-11-09 DIAGNOSIS — J4521 Mild intermittent asthma with (acute) exacerbation: Secondary | ICD-10-CM | POA: Diagnosis not present

## 2024-11-09 DIAGNOSIS — N390 Urinary tract infection, site not specified: Secondary | ICD-10-CM

## 2024-11-09 LAB — POC URINALSYSI DIPSTICK (AUTOMATED)
Bilirubin, UA: NEGATIVE
Clarity, UA: NEGATIVE
Glucose, UA: NEGATIVE
Ketones, UA: NEGATIVE
Leukocytes, UA: NEGATIVE
Nitrite, UA: NEGATIVE
Protein, UA: NEGATIVE
Spec Grav, UA: <=1.005 — AB
Urobilinogen, UA: 0.2 U/dL
pH, UA: 7

## 2024-11-09 MED ORDER — CIPROFLOXACIN HCL 500 MG PO TABS: 500.0000 mg | ORAL_TABLET | Freq: Two times a day (BID) | ORAL | 0 refills | Status: AC

## 2024-11-09 MED ORDER — CYANOCOBALAMIN 1000 MCG/ML IJ SOLN
1000.0000 ug | Freq: Once | INTRAMUSCULAR | Status: AC
  Administered 2024-11-09: 1000 ug via INTRAMUSCULAR

## 2024-11-09 NOTE — Progress Notes (Signed)
 "  Subjective:    Patient ID: Sheila Fuller, female    DOB: Feb 11, 1956, 69 y.o.   MRN: [id number]  DOS:  11/09/2024 Acute  Discussed the use of AI scribe software for clinical note transcription with the patient, who gave verbal consent to proceed.  History of Present Illness Sheila Fuller is a 69 year old female who presents with concerns of a possible urinary tract infection and worsening allergies.  Urinary symptoms - Strong urine odor for 2 months - No dysuria, urgency, frequency, fever, chills, lower abdominal or flank pain, vaginal discharge, or hematuria - No over-the-counter treatments used - States the odor is typically the only symptom she has  Allergic rhinitis - Severe symptoms since before Thanksgiving - Frequent sneezing, rhinorrhea, and nasal congestion - Uses Flonase  once daily  - Claritin  no longer effective - History of allergy testing and 3 years of immunotherapy in her thirties without benefit  Upper respiratory symptoms - Cold symptoms since last Friday with sneezing and coughing - Some wheezing for a couple of days, has used albuterol .      Review of Systems See above   Past Medical History:  Diagnosis Date   Allergic rhinitis    Allergy    Anxiety    Arthritis    Asthma    seasonally   BRCA1 positive    Cancer (HCC)    skin   Carcinoma in situ of perianal skin    +XRT & chemo 2000; no further f/u w/ specialist.    Cataract    will remove left end of 10-2016   Class 1 obesity    Depression    Family history of BRCA gene mutation    daughter BRCA1 +   Family history of breast cancer    Family history of pancreatic cancer    GERD (gastroesophageal reflux disease)    takes zantac maybe 2/week   Hyperlipidemia    Hypertension    Lactose intolerance 10/04/2013   Microhematuria 01/2006   saw Dr.Peterson, renal u/s: r pyelocaliectasis but CT was neg, cysto neg   Shortness of breath    only with asthma flare up   Sleep apnea    rarely uses  cpap    Past Surgical History:  Procedure Laterality Date   ANTERIOR CERVICAL DECOMP/DISCECTOMY FUSION N/A 08/23/2014   Procedure: C5-6 Anterior Cervical Discectomy and Fusion, Allograft, Plate;  Surgeon: Oneil JAYSON Herald, MD;  Location: [facility] OR;  Service: Orthopedics;  Laterality: N/A;   BREAST SURGERY  09/2018   BRCA-1 gene positive, +reconstruction   cervical cauterization     CESAREAN SECTION     GANGLION CYST EXCISION     MASTECTOMY Bilateral ~ 1/22020   NASAL SEPTUM SURGERY     POLYPECTOMY     Revision  breast reconstruction surgery 03/01/2019  02/2021   ROBOTIC ASSISTED BILATERAL SALPINGO OOPHERECTOMY Bilateral 11/24/2017   Procedure: XI ROBOTIC ASSISTED BILATERAL SALPINGO OOPHORECTOMY;  Surgeon: Eloy Herring, MD;  Location: [facility]S;  Service: Gynecology;  Laterality: Bilateral;    Current Outpatient Medications  Medication Instructions   albuterol  (PROAIR  HFA) 108 (90 Base) MCG/ACT inhaler 2 puffs, Inhalation, Every 4 hours PRN   buPROPion  (WELLBUTRIN  XL) 150 mg, Oral, Daily   co-enzyme Q-10 30 mg, 3 times daily   cyclobenzaprine  (FLEXERIL ) 5 mg, Oral, At bedtime PRN   D-MANNOSE PO 1 capsule, Daily   desloratadine  (CLARINEX ) 5 mg, Oral, Daily PRN   ezetimibe  (ZETIA ) 10 mg, Oral, Daily   fluticasone  (FLONASE )  50 MCG/ACT nasal spray 2 sprays, Each Nare, Daily   losartan  (COZAAR ) 100 mg, Oral, Daily   MAGNESIUM GLYCINATE PO Take by mouth.   metoprolol  tartrate (LOPRESSOR ) 50 mg, Oral, Daily   naproxen  (NAPROSYN ) 500 mg, Oral, Daily PRN   psyllium (REGULOID) 0.52 g, Daily   rosuvastatin  (CRESTOR ) 5 mg, Oral, 3 times weekly       Objective:   Physical Exam BP 108/70   Pulse 72   Temp 98.1 F (36.7 C) (Oral)   Resp 16   Ht 5' 7 (1.702 m)   Wt 171 lb 8 oz (77.8 kg)   SpO2 97%   BMI 26.86 kg/m  General:   Well developed, NAD, BMI noted. HEENT:  Normocephalic . Face symmetric, atraumatic.  No systolic congested Lungs:  Few rhonchi, few end expiratory wheezes. Normal  respiratory effort, no intercostal retractions, no accessory muscle use. Heart: RRR,  no murmur.  Lower extremities: no pretibial edema bilaterally Abdomen: Nontender at the lower abdomen, no flank tenderness to percussion Skin: Not pale. Not jaundice Neurologic:  alert & oriented X3.  Speech normal, gait appropriate for age and unassisted Psych--  Cognition and judgment appear intact.  Cooperative with normal attention span and concentration.  Behavior appropriate. No anxious or depressed appearing.      Assessment     Assessment Hyperglycemia HTN Hyperlipidemia (Crestor  3 times a week) Anxiety, depression GERD Seasonal asthma-Allergies Obesity : BMI~ 28 plus HTN, hyperlipidemia, OSA OSA: on Cpap consistently as off 04-2021 MSK: DJD, occasionally uses pain medication H/o Microhematuria 2007, Dr. Andra, renal US  showed right pyelocaliectasis, CT was negative, cystoscopy negative + BRCA: personal h/o (dx 2018)  and FH BRCA (daughter, sister).  See office visit 10/14/2017 Bilateral mastectomy 09/2018 H/o Carcinoma in situ, perianal skin, XRT, chemotherapy 2000. Not further follow-ups with specialist HOH: has hearing aids B Vitamins deficiency  Assessment & Plan UTI U dip shows some blood, will send a UA urine culture.  She is allergic to Bactrim, sending Cipro  for 3 days.  Drink plenty of fluids, call if not better. No h/o recurrent UTIs Asthma exacerbation Developed by URI a few days ago, now has some cough, few rhonchi and mild wheezing. Plan:  - Use albuterol  inhaler three to four times a day for a few days. - If not better will let me know, Symbicort?  More antibiotics? Allergic rhinitis Ongoing symptoms for several weeks.  On Flonase  but without much help. Plan: Continue Flonase , add Astepro , start Allegra.  Vitamin B12 deficiency: Had weekly shots, start monthly shots. RTC for a B12 shot in 1 month.  Follow-up with me already scheduled.     "

## 2024-11-09 NOTE — Patient Instructions (Addendum)
 Please read your instructions carefully.     Go to the front desk for the checkout Please make an appointment for your next B12 shot in 1 month.     URINARY TRACT INFECTION: You have a confirmed urinary tract infection. -We sent your urine sample for culture. -You are prescribed ciprofloxacin  for three days.  ASTHMA WITH ACUTE EXACERBATION: Your asthma symptoms have worsened likely due to a recent cold and allergies. -Use your albuterol  inhaler three to four times a day for a few days. -If symptoms persist, please let us  know, consider using Symbicort or antibiotics.  ALLERGIC RHINITIS: You have severe allergy symptoms. -Continue using Flonase , two puffs once daily. -Switch to Allegra for your antihistamine. -Continue using Astepro , two puffs twice daily. -Follow up with an ENT specialist for allergy testing as you are planning. VITAMIN B12 DEFICIENCY: You need regular B12 injections. -Schedule monthly B12 injections.

## 2024-11-10 LAB — URINALYSIS, ROUTINE W REFLEX MICROSCOPIC
Bilirubin Urine: NEGATIVE
Ketones, ur: NEGATIVE
Leukocytes,Ua: NEGATIVE
Nitrite: NEGATIVE
Specific Gravity, Urine: 1.01 (ref 1.000–1.030)
Total Protein, Urine: NEGATIVE
Urine Glucose: NEGATIVE
Urobilinogen, UA: 0.2 (ref 0.0–1.0)
pH: 7 (ref 5.0–8.0)

## 2024-11-10 LAB — URINE CULTURE
MICRO NUMBER:: 17468203
Result:: NO GROWTH
SPECIMEN QUALITY:: ADEQUATE

## 2024-11-10 NOTE — Assessment & Plan Note (Signed)
 UTI U dip shows some blood, will send a UA urine culture.  She is allergic to Bactrim, sending Cipro  for 3 days.  Drink plenty of fluids, call if not better. No h/o recurrent UTIs Asthma exacerbation Developed by URI a few days ago, now has some cough, few rhonchi and mild wheezing. Plan:  - Use albuterol  inhaler three to four times a day for a few days. - If not better will let me know, Symbicort?  More antibiotics? Allergic rhinitis Ongoing symptoms for several weeks.  On Flonase  but without much help. Plan: Continue Flonase , add Astepro , start Allegra.  Vitamin B12 deficiency: Had weekly shots, start monthly shots. RTC for a B12 shot in 1 month.  Follow-up with me already scheduled.

## 2024-11-11 ENCOUNTER — Ambulatory Visit: Payer: Self-pay | Admitting: Sports Medicine

## 2024-11-11 ENCOUNTER — Ambulatory Visit: Payer: Self-pay | Admitting: Internal Medicine

## 2024-11-22 ENCOUNTER — Other Ambulatory Visit: Payer: Self-pay | Admitting: Internal Medicine

## 2024-12-02 ENCOUNTER — Encounter: Payer: Self-pay | Admitting: Internal Medicine

## 2024-12-02 MED ORDER — METOPROLOL TARTRATE 50 MG PO TABS: 50.0000 mg | ORAL_TABLET | Freq: Two times a day (BID) | ORAL | Status: AC

## 2024-12-07 ENCOUNTER — Institutional Professional Consult (permissible substitution) (INDEPENDENT_AMBULATORY_CARE_PROVIDER_SITE_OTHER)

## 2024-12-13 ENCOUNTER — Ambulatory Visit

## 2024-12-15 ENCOUNTER — Ambulatory Visit: Admitting: Sports Medicine

## 2024-12-19 ENCOUNTER — Ambulatory Visit: Admitting: Internal Medicine

## 2025-02-17 ENCOUNTER — Ambulatory Visit: Admitting: Internal Medicine
